# Patient Record
Sex: Female | Born: 1940 | Race: White | Hispanic: No | Marital: Married | Smoking: Former smoker
Health system: Southern US, Community
[De-identification: ages and names within clinical notes are randomized; demographics above are authoritative.]

## PROBLEM LIST (undated history)

## (undated) DIAGNOSIS — Z9289 Personal history of other medical treatment: Secondary | ICD-10-CM

## (undated) DIAGNOSIS — R0600 Dyspnea, unspecified: Secondary | ICD-10-CM

## (undated) DIAGNOSIS — I10 Essential (primary) hypertension: Secondary | ICD-10-CM

## (undated) DIAGNOSIS — J189 Pneumonia, unspecified organism: Secondary | ICD-10-CM

## (undated) DIAGNOSIS — Z87442 Personal history of urinary calculi: Secondary | ICD-10-CM

## (undated) DIAGNOSIS — Z720 Tobacco use: Secondary | ICD-10-CM

## (undated) DIAGNOSIS — I739 Peripheral vascular disease, unspecified: Secondary | ICD-10-CM

## (undated) DIAGNOSIS — D649 Anemia, unspecified: Secondary | ICD-10-CM

## (undated) DIAGNOSIS — K219 Gastro-esophageal reflux disease without esophagitis: Secondary | ICD-10-CM

## (undated) DIAGNOSIS — I499 Cardiac arrhythmia, unspecified: Secondary | ICD-10-CM

## (undated) DIAGNOSIS — E785 Hyperlipidemia, unspecified: Secondary | ICD-10-CM

## (undated) DIAGNOSIS — N200 Calculus of kidney: Secondary | ICD-10-CM

## (undated) DIAGNOSIS — Z8719 Personal history of other diseases of the digestive system: Secondary | ICD-10-CM

## (undated) DIAGNOSIS — I219 Acute myocardial infarction, unspecified: Secondary | ICD-10-CM

## (undated) DIAGNOSIS — M1990 Unspecified osteoarthritis, unspecified site: Secondary | ICD-10-CM

## (undated) HISTORY — PX: APPENDECTOMY: SHX54

## (undated) HISTORY — PX: VAGINAL HYSTERECTOMY: SUR661

## (undated) HISTORY — DX: Hyperlipidemia, unspecified: E78.5

## (undated) HISTORY — PX: CHOLECYSTECTOMY: SHX55

## (undated) HISTORY — PX: INGUINAL HERNIA REPAIR: SUR1180

## (undated) HISTORY — PX: KNEE ARTHROSCOPY: SHX127

## (undated) HISTORY — PX: EYE SURGERY: SHX253

## (undated) HISTORY — PX: TUBAL LIGATION: SHX77

## (undated) HISTORY — PX: COLONOSCOPY: SHX174

## (undated) HISTORY — DX: Tobacco use: Z72.0

## (undated) HISTORY — DX: Personal history of other medical treatment: Z92.89

---

## 1962-07-20 DIAGNOSIS — Z9289 Personal history of other medical treatment: Secondary | ICD-10-CM

## 1962-07-20 HISTORY — DX: Personal history of other medical treatment: Z92.89

## 2001-11-23 ENCOUNTER — Ambulatory Visit (HOSPITAL_COMMUNITY): Admission: RE | Admit: 2001-11-23 | Discharge: 2001-11-23 | Payer: Self-pay

## 2008-12-06 DIAGNOSIS — Z9289 Personal history of other medical treatment: Secondary | ICD-10-CM

## 2008-12-06 HISTORY — DX: Personal history of other medical treatment: Z92.89

## 2009-07-08 ENCOUNTER — Inpatient Hospital Stay (HOSPITAL_COMMUNITY): Admission: EM | Admit: 2009-07-08 | Discharge: 2009-07-16 | Payer: Self-pay | Admitting: Emergency Medicine

## 2010-04-11 ENCOUNTER — Encounter: Admission: RE | Admit: 2010-04-11 | Discharge: 2010-04-11 | Payer: Self-pay | Admitting: Cardiology

## 2010-04-17 ENCOUNTER — Ambulatory Visit (HOSPITAL_COMMUNITY): Admission: RE | Admit: 2010-04-17 | Discharge: 2010-04-18 | Payer: Self-pay | Admitting: Cardiovascular Disease

## 2010-04-17 HISTORY — PX: OTHER SURGICAL HISTORY: SHX169

## 2010-10-02 LAB — BASIC METABOLIC PANEL
CO2: 27 mEq/L (ref 19–32)
Calcium: 8.7 mg/dL (ref 8.4–10.5)
Chloride: 102 mEq/L (ref 96–112)
Glucose, Bld: 112 mg/dL — ABNORMAL HIGH (ref 70–99)
Potassium: 3.8 mEq/L (ref 3.5–5.1)
Sodium: 137 mEq/L (ref 135–145)

## 2010-10-02 LAB — CBC
HCT: 32.8 % — ABNORMAL LOW (ref 36.0–46.0)
Hemoglobin: 10.9 g/dL — ABNORMAL LOW (ref 12.0–15.0)
MCH: 27.5 pg (ref 26.0–34.0)
MCV: 82.6 fL (ref 78.0–100.0)
Platelets: 263 10*3/uL (ref 150–400)
RBC: 3.97 MIL/uL (ref 3.87–5.11)
WBC: 9.5 10*3/uL (ref 4.0–10.5)

## 2010-10-02 LAB — MRSA PCR SCREENING

## 2010-10-20 LAB — COMPREHENSIVE METABOLIC PANEL
BUN: 13 mg/dL (ref 6–23)
CO2: 27 mEq/L (ref 19–32)
Chloride: 86 mEq/L — ABNORMAL LOW (ref 96–112)
Creatinine, Ser: 0.86 mg/dL (ref 0.4–1.2)
GFR calc non Af Amer: >60 mL/min (ref >60)
Total Bilirubin: 0.6 mg/dL (ref 0.3–1.2)

## 2010-10-20 LAB — BASIC METABOLIC PANEL
BUN: 12 mg/dL (ref 6–23)
BUN: 7 mg/dL (ref 6–23)
BUN: 8 mg/dL (ref 6–23)
BUN: 9 mg/dL (ref 6–23)
CO2: 23 mEq/L (ref 19–32)
CO2: 27 mEq/L (ref 19–32)
CO2: 27 mEq/L (ref 19–32)
CO2: 29 mEq/L (ref 19–32)
Calcium: 8 mg/dL — ABNORMAL LOW (ref 8.4–10.5)
Calcium: 8.6 mg/dL (ref 8.4–10.5)
Chloride: 102 mEq/L (ref 96–112)
Chloride: 103 mEq/L (ref 96–112)
Chloride: 94 mEq/L — ABNORMAL LOW (ref 96–112)
Chloride: 94 mEq/L — ABNORMAL LOW (ref 96–112)
Chloride: 98 mEq/L (ref 96–112)
Creatinine, Ser: 0.67 mg/dL (ref 0.4–1.2)
Creatinine, Ser: 0.87 mg/dL (ref 0.4–1.2)
GFR calc non Af Amer: >60 mL/min (ref >60)
GFR calc non Af Amer: >60 mL/min (ref >60)
GFR calc non Af Amer: >60 mL/min (ref >60)
Glucose, Bld: 109 mg/dL — ABNORMAL HIGH (ref 70–99)
Glucose, Bld: 164 mg/dL — ABNORMAL HIGH (ref 70–99)
Potassium: 3.5 mEq/L (ref 3.5–5.1)
Potassium: 3.7 mEq/L (ref 3.5–5.1)
Potassium: 4.3 mEq/L (ref 3.5–5.1)
Potassium: 4.4 mEq/L (ref 3.5–5.1)
Sodium: 130 mEq/L — ABNORMAL LOW (ref 135–145)
Sodium: 131 mEq/L — ABNORMAL LOW (ref 135–145)
Sodium: 136 mEq/L (ref 135–145)

## 2010-10-20 LAB — CBC
HCT: 36.6 % (ref 36.0–46.0)
HCT: 37.3 % (ref 36.0–46.0)
HCT: 40 % (ref 36.0–46.0)
HCT: 40.7 % (ref 36.0–46.0)
Hemoglobin: 11.9 g/dL — ABNORMAL LOW (ref 12.0–15.0)
Hemoglobin: 12.6 g/dL (ref 12.0–15.0)
Hemoglobin: 12.7 g/dL (ref 12.0–15.0)
Hemoglobin: 13.8 g/dL (ref 12.0–15.0)
MCHC: 33.6 g/dL (ref 30.0–36.0)
MCV: 83.4 fL (ref 78.0–100.0)
MCV: 83.4 fL (ref 78.0–100.0)
MCV: 83.8 fL (ref 78.0–100.0)
Platelets: 359 10*3/uL (ref 150–400)
Platelets: 377 10*3/uL (ref 150–400)
RBC: 4.39 MIL/uL (ref 3.87–5.11)
RBC: 4.48 MIL/uL (ref 3.87–5.11)
RBC: 4.53 MIL/uL (ref 3.87–5.11)
RDW: 13.9 % (ref 11.5–15.5)
RDW: 14.1 % (ref 11.5–15.5)
RDW: 14.1 % (ref 11.5–15.5)
RDW: 14.1 % (ref 11.5–15.5)
RDW: 14.1 % (ref 11.5–15.5)
WBC: 13.9 10*3/uL — ABNORMAL HIGH (ref 4.0–10.5)
WBC: 15.9 10*3/uL — ABNORMAL HIGH (ref 4.0–10.5)
WBC: 16.3 10*3/uL — ABNORMAL HIGH (ref 4.0–10.5)
WBC: 17.6 10*3/uL — ABNORMAL HIGH (ref 4.0–10.5)

## 2010-10-20 LAB — URINALYSIS, ROUTINE W REFLEX MICROSCOPIC
Bilirubin Urine: NEGATIVE
Glucose, UA: NEGATIVE mg/dL
Ketones, ur: >80 mg/dL — AB
Nitrite: NEGATIVE
Nitrite: NEGATIVE
Specific Gravity, Urine: 1.012 (ref 1.005–1.030)
Specific Gravity, Urine: 1.02 (ref 1.005–1.030)
Urobilinogen, UA: 0.2 mg/dL (ref 0.0–1.0)
pH: 5.5 (ref 5.0–8.0)
pH: 7.5 (ref 5.0–8.0)

## 2010-10-20 LAB — URINE MICROSCOPIC-ADD ON

## 2010-10-20 LAB — DIFFERENTIAL
Basophils Absolute: 0 10*3/uL (ref 0.0–0.1)
Basophils Absolute: 0.1 10*3/uL (ref 0.0–0.1)
Basophils Relative: 0 % (ref 0–1)
Basophils Relative: 0 % (ref 0–1)
Eosinophils Absolute: 0.1 10*3/uL (ref 0.0–0.7)
Eosinophils Relative: 1 % (ref 0–5)
Neutro Abs: 10.8 10*3/uL — ABNORMAL HIGH (ref 1.7–7.7)
Neutrophils Relative %: 78 % — ABNORMAL HIGH (ref 43–77)

## 2010-10-20 LAB — CARDIAC PANEL(CRET KIN+CKTOT+MB+TROPI)
CK, MB: 1.9 ng/mL (ref 0.3–4.0)
CK, MB: 3 ng/mL (ref 0.3–4.0)
Relative Index: 2.5 (ref 0.0–2.5)
Total CK: 115 U/L (ref 7–177)
Troponin I: 0.02 ng/mL (ref 0.00–0.06)

## 2010-10-20 LAB — URINE CULTURE

## 2010-10-20 LAB — HEMOCCULT GUIAC POC 1CARD (OFFICE): Fecal Occult Bld: NEGATIVE

## 2010-10-20 LAB — TSH: TSH: 0.462 u[IU]/mL (ref 0.350–4.500)

## 2010-10-20 LAB — LIPASE, BLOOD: Lipase: 28 U/L (ref 11–59)

## 2010-10-20 LAB — HEPARIN LEVEL (UNFRACTIONATED): Heparin Unfractionated: 0.14 IU/mL — ABNORMAL LOW (ref 0.30–0.70)

## 2010-12-02 NOTE — Procedures (Signed)
NAMEGINIA, RUDELL NO.:  [id number]   MEDICAL RECORD NO.:  [id number]          PATIENT TYPE:  OIB   LOCATION:  2909                         FACILITY:  [facility]   PHYSICIAN:  Nanetta Batty, M.D.   DATE OF BIRTH:  [birth year]   DATE OF PROCEDURE:  DATE OF DISCHARGE:                    PERIPHERAL VASCULAR INVASIVE PROCEDURE    Ms. Christy Campbell is a 70 year old patient with positive risk factors  including hypertension, hyperlipidemia who was sent by Dr. Marina Goodell for  evaluation and treatment of claudication.  Dopplers suggested high-grade  right common iliac and left SFA disease.  She presents here for  angiography and potential intervention.   PROCEDURE DESCRIPTION:  The patient was brought to second floor of [facility] in postabsorptive state.  She is premedicated  with p.o. Valium, IV Versed, and fentanyl.  Her right groin was prepped  and shaved in usual sterile fashion.  A 1% Xylocaine was used for local  anesthesia.  A 5 upgraded to a 7-French crossover sheath was inserted  into the right femoral artery using standard Seldinger technique.  A 5-  French pigtail catheter was used for arch angiography, midstream and  distal abdominal aortography with bifemoral runoff using bolus chase  digital subtraction step-table technique.  Visipaque dye was used for  entirety of the case.  Retrograde aortic pressures were monitored  throughout the case.   ANGIOGRAPHIC RESULTS:  1. Arch aortogram normal.  2. Abdominal aortogram.      a.     A 50% left renal artery stenosis.  3. Left lower extremity.      a.     A 80% distal left SFA stenosis, 3-vessel runoff.  4. Right lower extremity.      a.     A 80% proximal right common iliac artery stenosis with post-       stenotic dilatation.      b.     A 80% proximal right SFA with 3-vessel runoff.   IMPRESSION:  The patient has high-grade bilateral superficial femoral  artery and right iliac disease.  We  will proceed with PTA and stenting  of the left superficial femoral artery and right common iliac artery.   The patient received 4000 units of heparin intravenously.  Contralateral  access was obtained with a 7-French Cook Ansel sheath.  Wholey wire was  used passed to across the lesion which was predilated with a 4 x 2  Powerflex.  This is stented with a 6 x 4 Smart stent and postdilated  with 5 x 3 FoxCross resulting in reduction of 80% stenosis to 0%  residual.   Following this, the sheath was pulled back across the bifurcation.  An  injection was performed localizing the iliac lesion.  This was then  directly stented with a 12 x 4 Smart stent.  There was some overhang  into the ostium of the left common iliac artery stent because of poor  visualization.  This was then postdilated with 7 x 3 balloon resulting  in reduction of 80% stenosis to 0% residual.  There was residual right  superficial femoral artery disease.  Successful PTA and stenting of left superficial femoral artery and right  iliac.  The patient tolerated the procedure well.  The lung sheath was  exchanged for short 7-French sheath.  The patient left lab in stable  condition.  Sheath will  be removed once ACT falls below 170.  Pressure will be held on the groin  to achieve hemostasis.  The patient left the lab in stable condition.  She will be discharged in the morning if she remains stable overnight.  We will get followup Dopplers and ABIs at which time she will see me  back in followup.      Nanetta Batty, M.D.      Cordelia Pen  D:  04/17/2010  T:  04/18/2010  Job:  [id number]   cc:   [facility]  Brent Bulla, MD  [facility]   Electronically Signed by Nanetta Batty M.D. on 04/30/2010 01:01:44 PM

## 2010-12-05 NOTE — Op Note (Signed)
[facility]  Patient:    Christy Campbell, Christy Campbell Visit Number: [id number] MRN: [id number]          Service Type: DSU Location: DAY Attending Physician:  Meredith Leeds Dictated by:   Zigmund Daniel, M.D. Proc. Date: 11/23/01 Admit Date:  11/23/2001   CC:         Shelle Iron, M.D.   Operative Report  PREOPERATIVE DIAGNOSIS:  Indirect left inguinal hernia.  POSTOPERATIVE DIAGNOSIS:  Indirect left inguinal hernia.  OPERATION:  Repair of left inguinal hernia.  SURGEON:  Zigmund Daniel, M.D.  ANESTHESIA:  Local with sedation.  DESCRIPTION OF PROCEDURE:  After the patient was monitored and sedated and had routine preparation and draping of the left inguinal region, I liberally infused local anesthetic in the operative field and used further local anesthetic as I deepened the dissection.  The patient was kept sedated and comfortable throughout the procedure.  I made a short, slightly oblique incision, beginning just above and lateral to the pubic tubercle and dissected down through the fat until I encountered the external oblique which I opened in the direction of its fibers.  I identified the ilioinguinal nerve and dissected it free of the round ligament and hernia and protected it and preserved it.  I then detached the round ligament at the pubic tubercle, getting hemostasis toward the pubic tubercle with the Bovie.  In dissecting it up, I made a hole in the large indirect hernia sac which appeared rather chronic.  I dissected a little bit of the sac up and removed it and then suture ligated the remainder with 3-0 Vicryl.  It contained no viscera at the moment.  I dissected the entire hernia and round ligament up to the level of the internal ring and reduced the hernia and plugged the internal ring with a generous plug of polypropylene mesh, sewn in with a 2-0 silk figure-of-eight suture.  I sewed that into the internal oblique fascia.  I  then patched the inguinal floor with a patch of polypropylene mesh sewn in with 2-0 Prolene from the pubic tubercle superiorly and medially with a basting stitch in the superficial layer of the internal oblique fascia and inferiorly and medially with a running stitch in the shelving edge of the inguinal ligament.  I felt that the hernia repair was secure.  Hemostasis was good.  I put a little more local anesthetic in the wound and then closed the external oblique with running 3-0 Vicryl, approximated the subcutaneous tissues with running 3-0 Vicryl, and closed the skin with intracuticular 4-0 Vicryl and Steri-Strips. The patient tolerated the operation well. Dictated by:   Zigmund Daniel, M.D. Attending Physician:  Meredith Leeds DD:  11/23/01 TD:  11/23/01 Job: [id number]

## 2011-11-30 ENCOUNTER — Ambulatory Visit
Admission: RE | Admit: 2011-11-30 | Discharge: 2011-11-30 | Disposition: A | Discharge status: Home / Self Care | Payer: Medicare Other | Source: Ambulatory Visit | Attending: Cardiovascular Disease | Admitting: Cardiovascular Disease

## 2011-11-30 ENCOUNTER — Other Ambulatory Visit: Payer: Self-pay | Admitting: Cardiovascular Disease

## 2011-11-30 DIAGNOSIS — Z01811 Encounter for preprocedural respiratory examination: Secondary | ICD-10-CM

## 2011-12-16 ENCOUNTER — Encounter (HOSPITAL_COMMUNITY): Payer: Self-pay | Admitting: Respiratory Therapy

## 2011-12-29 ENCOUNTER — Encounter (HOSPITAL_COMMUNITY)
Admission: RE | Disposition: A | Discharge status: Home / Self Care | Payer: Self-pay | Source: Ambulatory Visit | Attending: Cardiovascular Disease

## 2011-12-29 ENCOUNTER — Other Ambulatory Visit: Payer: Self-pay | Admitting: Cardiovascular Disease

## 2011-12-29 ENCOUNTER — Encounter (HOSPITAL_COMMUNITY): Payer: Self-pay | Admitting: Cardiology

## 2011-12-29 ENCOUNTER — Ambulatory Visit (HOSPITAL_COMMUNITY)
Admission: RE | Admit: 2011-12-29 | Discharge: 2011-12-30 | Disposition: A | Discharge status: Home / Self Care | Payer: Medicare Other | Source: Ambulatory Visit | Attending: Cardiovascular Disease | Admitting: Cardiovascular Disease

## 2011-12-29 ENCOUNTER — Ambulatory Visit (HOSPITAL_COMMUNITY): Payer: Medicare Other

## 2011-12-29 DIAGNOSIS — I701 Atherosclerosis of renal artery: Secondary | ICD-10-CM | POA: Insufficient documentation

## 2011-12-29 DIAGNOSIS — I70219 Atherosclerosis of native arteries of extremities with intermittent claudication, unspecified extremity: Secondary | ICD-10-CM | POA: Insufficient documentation

## 2011-12-29 DIAGNOSIS — I739 Peripheral vascular disease, unspecified: Secondary | ICD-10-CM

## 2011-12-29 DIAGNOSIS — E785 Hyperlipidemia, unspecified: Secondary | ICD-10-CM | POA: Insufficient documentation

## 2011-12-29 DIAGNOSIS — R109 Unspecified abdominal pain: Secondary | ICD-10-CM | POA: Diagnosis not present

## 2011-12-29 DIAGNOSIS — N289 Disorder of kidney and ureter, unspecified: Secondary | ICD-10-CM | POA: Diagnosis present

## 2011-12-29 DIAGNOSIS — IMO0001 Reserved for inherently not codable concepts without codable children: Secondary | ICD-10-CM

## 2011-12-29 DIAGNOSIS — I10 Essential (primary) hypertension: Secondary | ICD-10-CM | POA: Insufficient documentation

## 2011-12-29 HISTORY — PX: OTHER SURGICAL HISTORY: SHX169

## 2011-12-29 HISTORY — DX: Peripheral vascular disease, unspecified: I73.9

## 2011-12-29 HISTORY — PX: ATHERECTOMY: SHX5502

## 2011-12-29 LAB — POCT ACTIVATED CLOTTING TIME: Activated Clotting Time: 204 seconds

## 2011-12-29 SURGERY — ATHERECTOMY
Anesthesia: LOCAL

## 2011-12-29 MED ORDER — ONDANSETRON HCL 4 MG/2ML IJ SOLN
INTRAMUSCULAR | Status: AC
  Filled 2011-12-29: qty 2

## 2011-12-29 MED ORDER — MORPHINE SULFATE 4 MG/ML IJ SOLN
INTRAMUSCULAR | Status: AC
  Filled 2011-12-29: qty 1

## 2011-12-29 MED ORDER — HYDRALAZINE HCL 20 MG/ML IJ SOLN: 10.0000 mg | INTRAMUSCULAR | Status: DC

## 2011-12-29 MED ORDER — METOPROLOL TARTRATE 25 MG PO TABS
25.0000 mg | ORAL_TABLET | Freq: Every day | ORAL | Status: DC
  Administered 2011-12-30: 25 mg via ORAL
  Filled 2011-12-29 (×2): qty 1

## 2011-12-29 MED ORDER — MIDAZOLAM HCL 2 MG/2ML IJ SOLN
INTRAMUSCULAR | Status: AC
  Filled 2011-12-29: qty 2

## 2011-12-29 MED ORDER — FENTANYL CITRATE 0.05 MG/ML IJ SOLN
INTRAMUSCULAR | Status: AC
  Filled 2011-12-29: qty 2

## 2011-12-29 MED ORDER — FAMOTIDINE IN NACL 20-0.9 MG/50ML-% IV SOLN
20.0000 mg | Freq: Once | INTRAVENOUS | Status: AC
  Administered 2011-12-29: 20 mg via INTRAVENOUS

## 2011-12-29 MED ORDER — ASPIRIN EC 81 MG PO TBEC
81.0000 mg | DELAYED_RELEASE_TABLET | Freq: Every day | ORAL | Status: DC
  Administered 2011-12-29 – 2011-12-30 (×2): 81 mg via ORAL
  Filled 2011-12-29 (×2): qty 1

## 2011-12-29 MED ORDER — LISINOPRIL-HYDROCHLOROTHIAZIDE 20-12.5 MG PO TABS: 1.0000 | ORAL_TABLET | Freq: Two times a day (BID) | ORAL | Status: DC

## 2011-12-29 MED ORDER — SODIUM CHLORIDE 0.9 % IJ SOLN
3.0000 mL | Freq: Two times a day (BID) | INTRAMUSCULAR | Status: DC
  Administered 2011-12-29 – 2011-12-30 (×2): 3 mL via INTRAVENOUS

## 2011-12-29 MED ORDER — DILTIAZEM HCL ER COATED BEADS 360 MG PO CP24
360.0000 mg | ORAL_CAPSULE | Freq: Every day | ORAL | Status: DC
  Administered 2011-12-30: 360 mg via ORAL
  Filled 2011-12-29 (×3): qty 1

## 2011-12-29 MED ORDER — GI COCKTAIL ~~LOC~~
30.0000 mL | Freq: Once | ORAL | Status: AC
  Administered 2011-12-29: 30 mL via ORAL
  Filled 2011-12-29: qty 30

## 2011-12-29 MED ORDER — LISINOPRIL 20 MG PO TABS
20.0000 mg | ORAL_TABLET | Freq: Two times a day (BID) | ORAL | Status: DC
  Administered 2011-12-29 – 2011-12-30 (×2): 20 mg via ORAL
  Filled 2011-12-29 (×3): qty 1

## 2011-12-29 MED ORDER — ACETAMINOPHEN 325 MG PO TABS
325.0000 mg | ORAL_TABLET | Freq: Every day | ORAL | Status: DC
  Administered 2011-12-29: 325 mg via ORAL
  Filled 2011-12-29: qty 1

## 2011-12-29 MED ORDER — CLOPIDOGREL BISULFATE 75 MG PO TABS
75.0000 mg | ORAL_TABLET | Freq: Every day | ORAL | Status: DC
  Administered 2011-12-29 – 2011-12-30 (×2): 75 mg via ORAL
  Filled 2011-12-29 (×2): qty 1

## 2011-12-29 MED ORDER — DIAZEPAM 5 MG PO TABS: 5.0000 mg | ORAL_TABLET | ORAL | Status: DC

## 2011-12-29 MED ORDER — SODIUM CHLORIDE 0.9 % IV SOLN: INTRAVENOUS | Status: DC

## 2011-12-29 MED ORDER — MORPHINE SULFATE 4 MG/ML IJ SOLN
1.0000 mg | INTRAMUSCULAR | Status: DC | PRN
  Administered 2011-12-29 – 2011-12-30 (×3): 1 mg via INTRAVENOUS
  Filled 2011-12-29 (×2): qty 1

## 2011-12-29 MED ORDER — SODIUM CHLORIDE 0.9 % IJ SOLN: 3.0000 mL | Freq: Two times a day (BID) | INTRAMUSCULAR | Status: DC

## 2011-12-29 MED ORDER — ASPIRIN 81 MG PO TBEC: 81.0000 mg | DELAYED_RELEASE_TABLET | Freq: Every day | ORAL | Status: DC

## 2011-12-29 MED ORDER — LIDOCAINE HCL (PF) 1 % IJ SOLN
INTRAMUSCULAR | Status: AC
  Filled 2011-12-29: qty 30

## 2011-12-29 MED ORDER — HYDROCHLOROTHIAZIDE 12.5 MG PO CAPS
12.5000 mg | ORAL_CAPSULE | Freq: Two times a day (BID) | ORAL | Status: DC
  Administered 2011-12-29 – 2011-12-30 (×2): 12.5 mg via ORAL
  Filled 2011-12-29 (×3): qty 1

## 2011-12-29 MED ORDER — ALUM & MAG HYDROXIDE-SIMETH 200-200-20 MG/5ML PO SUSP
15.0000 mL | ORAL | Status: DC | PRN
  Filled 2011-12-29: qty 30

## 2011-12-29 MED ORDER — FAMOTIDINE IN NACL 20-0.9 MG/50ML-% IV SOLN
INTRAVENOUS | Status: AC
  Filled 2011-12-29: qty 50

## 2011-12-29 MED ORDER — SODIUM CHLORIDE 0.9 % IV SOLN: INTRAVENOUS | Status: AC

## 2011-12-29 MED ORDER — ONDANSETRON HCL 4 MG/2ML IJ SOLN: 4.0000 mg | Freq: Four times a day (QID) | INTRAMUSCULAR | Status: DC | PRN

## 2011-12-29 MED ORDER — PROMETHAZINE HCL 25 MG/ML IJ SOLN
INTRAMUSCULAR | Status: AC
  Filled 2011-12-29: qty 1

## 2011-12-29 MED ORDER — HEPARIN SODIUM (PORCINE) 1000 UNIT/ML IJ SOLN
INTRAMUSCULAR | Status: AC
  Filled 2011-12-29: qty 1

## 2011-12-29 MED ORDER — ACETAMINOPHEN 325 MG PO TABS: 650.0000 mg | ORAL_TABLET | ORAL | Status: DC | PRN

## 2011-12-29 MED ORDER — HEPARIN (PORCINE) IN NACL 2-0.9 UNIT/ML-% IJ SOLN
INTRAMUSCULAR | Status: AC
  Filled 2011-12-29: qty 1000

## 2011-12-29 MED ORDER — ATORVASTATIN CALCIUM 40 MG PO TABS
40.0000 mg | ORAL_TABLET | Freq: Every day | ORAL | Status: DC
  Administered 2011-12-29: 40 mg via ORAL
  Filled 2011-12-29 (×2): qty 1

## 2011-12-29 MED ORDER — ONDANSETRON HCL 4 MG/2ML IJ SOLN
4.0000 mg | Freq: Four times a day (QID) | INTRAMUSCULAR | Status: DC | PRN
  Administered 2011-12-29: 4 mg via INTRAVENOUS

## 2011-12-29 MED ORDER — OMEGA-3-ACID ETHYL ESTERS 1 G PO CAPS
1.0000 g | ORAL_CAPSULE | Freq: Two times a day (BID) | ORAL | Status: DC
  Administered 2011-12-29 – 2011-12-30 (×2): 1 g via ORAL
  Filled 2011-12-29 (×3): qty 1

## 2011-12-29 MED ORDER — PROMETHAZINE HCL 25 MG/ML IJ SOLN
12.5000 mg | Freq: Four times a day (QID) | INTRAMUSCULAR | Status: DC | PRN
  Administered 2011-12-29: 12.5 mg via INTRAVENOUS

## 2011-12-29 NOTE — H&P (Signed)
  H & P will be scanned in.  Pt was reexamined and existing H & P reviewed. No changes found.  Runell Gess, MD [facility] 12/29/2011 9:27 AM

## 2011-12-29 NOTE — Progress Notes (Signed)
Called by cath lab holding area due to pt's abd. Pain.  Pain developed after she took a few bites of lunch.  She also complained of back pain, rec'd IV morphine, then increased abd pain. And back pain. She stated this was like when she took asprin on another admit.  GI cocktail given and IV Pepcid.  Pt. Then began with slowing heart rate into the 30's accompanied by nausea and vomiting.  IV atropine 0.5 mg given.  HR returned to WNL, Zofran given for nausea and vomiting..  After vomiting her abd pain began easing.  Pt's pain did return along with nausea and vomiting. Phenergan given but continues to complain of 5/10 abd pain.  Abd on exam has been soft with bowel sounds, but with continued pain and recent procedure will proceed with CT of abd and pelvis.  Dr. Allyson Sabal aware and in agreement.  We will place pt. In 2900 B instead of 2500 due to ongoing pain.

## 2011-12-29 NOTE — Op Note (Signed)
Christy Campbell is a 71 y.o. female    [id number] LOCATION:  FACILITY: [facility]  PHYSICIAN: Nanetta Batty, M.D. Jun 23, 1941   DATE OF PROCEDURE:  12/29/2011  DATE OF DISCHARGE:  [facility]  PV Intervention    History obtained from chart review. Christy Campbell  is a 71 year old divorce Caucasian female mother of 2 who I saw in the office on 11/30/2011. Her problems include discontinue tobacco abuse earlier this year, hypertension, hyperlipidemia and peripheral vascular occlusive disease. She was referred to me for claudication. Her cardiac workup revealed a negative Myoview and normal 2-D echo with moderate concentric LVH. She was to have iliac and SFA disease by angiography which I performed 04/17/2010. I stented her right common iliac artery with a nitinol self expanding stent as well as her distal left SFA. She had residual proximal right SFA stenosis and has been complaining of bilateral lower clinic location. Recent Dopplers in our office revealed a right ABI of 0.6, and a left ABI of 0.87 with high-frequency signals in both proximal SFAs. She presents now for angiography and potential percutaneous intervention for lifestyle limiting claudication.   PROCEDURE DESCRIPTION:    The patient was brought to the second floor  [facility] in the postabsorptive state. She was  premedicated with IV Versed and fentanyl.. Her left groin was prepped and shaved in usual sterile fashion. Xylocaine 1% was used  for local anesthesia. A 5 French sheath was inserted into the left common femoral  artery using standard Seldinger technique. A 5 French pigtail catheter was used for abdominal aortography with bifemoral runoff using bolus chase digital subtraction settable technique. Visipaque dye was used for the entirety of the case. Retrograde aortic pressure was monitored during the case.   HEMODYNAMICS:    AO SYSTOLIC/AO DIASTOLIC: on 191/81   ANGIOGRAPHIC  RESULTS:   1: Abdominal aortogram-40% left renal artery stenosis, normal infra renal abdominal aorta.  2: Left lower extremity-90% eccentric proximal left SFA stenosis. The distal left SFA stent was widely patent. There was three-vessel runoff.  3: Right lower extremity-patent right proximal common iliac artery stent with obvious "overhang" into the origin of the left common iliac artery. 80-90% eccentric proximal right SFA stenosis with three-vessel runoff.   IMPRESSION:Christy Campbell  has a high-grade eccentric proximal right SFA stenosis. We'll proceed with turbo hock directional atherectomy for lifestyle limiting claudication.  Procedure description: Contralateral access was obtained with a 5 [place] crossover catheter, angled Glidewire, and 7 [place] destination sheath. The patient received 5000 units of heparin intravenously and ACT of 276. Total contrast administered to the patient was 167 cc. The lesion was crossed with a 0.014 cm [place] core wire. Directional atherectomy was performed with LS--M turbo [facility] device. 5 cuts were performed removing a significant amount of atherosclerotic plaque. Completion angiography revealed a widely patent proximal right SFA without dissection. Balloon angioplasty was not required. The sheath was then pulled back across the bifurcation and secured in place. The patient left the lab in stable condition.  Final impression: Successful directional atherectomy using the turbo Hawk device and eccentric proximal right SFA stenosis prolapse pelvic claudication. The patient is already on aspirin and Plavix. She'll be hydrated overnight, discharged in the morning will get followup Dopplers  On her back in the office , followup after that and we'll discuss left SFA intervention.  Runell Gess MD, [facility] 12/29/2011 10:37 AM

## 2011-12-29 NOTE — Discharge Instructions (Signed)

## 2011-12-30 ENCOUNTER — Ambulatory Visit (HOSPITAL_COMMUNITY): Payer: Medicare Other

## 2011-12-30 DIAGNOSIS — I10 Essential (primary) hypertension: Secondary | ICD-10-CM | POA: Diagnosis present

## 2011-12-30 DIAGNOSIS — N289 Disorder of kidney and ureter, unspecified: Secondary | ICD-10-CM | POA: Diagnosis present

## 2011-12-30 DIAGNOSIS — IMO0001 Reserved for inherently not codable concepts without codable children: Secondary | ICD-10-CM

## 2011-12-30 DIAGNOSIS — E785 Hyperlipidemia, unspecified: Secondary | ICD-10-CM | POA: Diagnosis present

## 2011-12-30 LAB — CBC
MCHC: 32.7 g/dL (ref 30.0–36.0)
Platelets: 278 10*3/uL (ref 150–400)
RDW: 15.1 % (ref 11.5–15.5)

## 2011-12-30 LAB — BASIC METABOLIC PANEL
BUN: 21 mg/dL (ref 6–23)
Calcium: 9.3 mg/dL (ref 8.4–10.5)
Creatinine, Ser: 0.99 mg/dL (ref 0.50–1.10)
GFR calc Af Amer: 65 mL/min — ABNORMAL LOW (ref >90)
GFR calc non Af Amer: 56 mL/min — ABNORMAL LOW (ref >90)
Potassium: 4.1 mEq/L (ref 3.5–5.1)

## 2011-12-30 MED ORDER — ATROPINE SULFATE 1 MG/ML IJ SOLN
INTRAMUSCULAR | Status: AC
  Filled 2011-12-30: qty 1

## 2011-12-30 MED ORDER — GADOBENATE DIMEGLUMINE 529 MG/ML IV SOLN
20.0000 mL | Freq: Once | INTRAVENOUS | Status: AC
  Administered 2011-12-30: 17 mL via INTRAVENOUS

## 2011-12-30 NOTE — Progress Notes (Signed)
Subjective:  No more abdominal pain.   Objective:  Vital Signs in the last 24 hours: Temp:  [97.8 F (36.6 C)-98.6 F (37 C)] 97.8 F (36.6 C) (06/12 0400) Pulse Rate:  [59-73] 68  (06/11 1800) Resp:  [10-21] 13  (06/11 1800) BP: (98-134)/(49-72) 98/49 mmHg (06/11 2126) SpO2:  [96 %-99 %] 96 % (06/11 1800)  Intake/Output from previous day:  Intake/Output Summary (Last 24 hours) at 12/30/11 0835 Last data filed at 12/30/11 0600  Gross per 24 hour  Intake    308 ml  Output   1250 ml  Net   -942 ml    Physical Exam: General appearance: alert, cooperative and no distress Lungs: clear to auscultation bilaterally Heart: regular rate and rhythm Lt groin, mild echymosis, no hematoma   Rate: 70  Rhythm: normal sinus rhythm  Lab Results:  Basename 12/30/11 0555  WBC 11.2*  HGB 12.4  PLT 278    Basename 12/30/11 0555  NA 140  K 4.1  CL 101  CO2 30  GLUCOSE 93  BUN 21  CREATININE 0.99   No results found for this basename: TROPONINI:2,CK,MB:2 in the last 72 hours Hepatic Function Panel No results found for this basename: PROT,ALBUMIN,AST,ALT,ALKPHOS,BILITOT,BILIDIR,IBILI in the last 72 hours No results found for this basename: CHOL in the last 72 hours No results found for this basename: INR in the last 72 hours  Imaging: Imaging results have been reviewed  Cardiac Studies:  Assessment/Plan:   Principal Problem:  *Claudication in peripheral vascular disease Active Problems:  PVD, Rt SFA PTA 12/29/11, residual Lt SFA disease  Renal lesion, incidental finding on CT 12/29/11  Abdominal pain, post PV angio, Possibly secondary to ASA on empty stomach  HTN, LVH, Nl LVF May 2010  Dyslipidemia  Normal Myoview, May 2010   Plan- Will discuss CT scan with MD-? Work up now or as OP. Otherwise she is ready for discharge.   Corine Shelter PA-C 12/30/2011, 8:35 AM

## 2011-12-30 NOTE — Discharge Summary (Signed)
Patient ID: Christy Campbell,  MRN: [id number], DOB/AGE: 11-22-1940 71 y.o.  Admit date: 12/29/2011 Discharge date: 12/30/2011  Primary Care Provider:  Primary Cardiologist: Dr Allyson Sabal  Discharge Diagnoses Principal Problem:  *Claudication in peripheral vascular disease Active Problems:  PVD, Rt SFA PTA 12/29/11, residual Lt SFA disease  Renal lesion, incidental finding on CT 12/29/11  Abdominal pain, post PV angio, Possibly secondary to ASA on empty stomach  HTN, LVH, Nl LVF May 2010  Dyslipidemia  Normal Myoview, May 2010    Procedures: Rt SFA [facility] Course:Christy Campbell is a 71 year old divorced Caucasian female mother of 2 who I saw in the office on 11/30/2011. Her problems include discontinue tobacco abuse earlier this year, hypertension, hyperlipidemia and peripheral vascular occlusive disease. She was referred to Dr Allyson Sabal for claudication. Her cardiac workup revealed a negative Myoview and normal 2-D echo with moderate concentric LVH. She was found to have iliac and SFA disease by angiography which I performed 04/17/2010. Dr Allyson Sabal stented her right common iliac artery with a nitinol self expanding stent as well as her distal left SFA. She had residual proximal right SFA stenosis and has been complaining of bilateral lower clinic location. Recent Dopplers in our office revealed a right ABI of 0.6, and a left ABI of 0.87 with high-frequency signals in both proximal SFAs. She presents now for angiography and potential percutaneous intervention for lifestyle limiting claudication. The pt had PV angiogram and Rt SFA PTA 12/29/11 by Dr Allyson Sabal. Post op she had abdominal pain that was ultimatley felt to be secondary to ASA. A Ct of her abdomin was done and she had a Lt renal mass noted. MRI W/O was ordered. She'll be discharged later today after this is done. She does have residual Lt SFA disease and further Rx options will be discussed with Dr Allyson Sabal as an OP.     Discharge Vitals:  Blood  pressure 140/82, pulse 70, temperature 98 F (36.7 C), temperature source Oral, resp. rate 18, height 5\' 7"  (1.702 m), weight 81.194 kg (179 lb), SpO2 98.00%.    Labs: Results for orders placed during the hospital encounter of 12/29/11 (from the past 48 hour(s))  POCT ACTIVATED CLOTTING TIME     Status: Normal   Collection Time   12/29/11 10:06 AM      Component Value Range Comment   Activated Clotting Time 276     POCT ACTIVATED CLOTTING TIME     Status: Normal   Collection Time   12/29/11 11:10 AM      Component Value Range Comment   Activated Clotting Time 204     POCT ACTIVATED CLOTTING TIME     Status: Normal   Collection Time   12/29/11 11:55 AM      Component Value Range Comment   Activated Clotting Time 171     MRSA PCR SCREENING     Status: Normal   Collection Time   12/29/11  3:55 PM      Component Value Range Comment   MRSA by PCR NEGATIVE  NEGATIVE   BASIC METABOLIC PANEL     Status: Abnormal   Collection Time   12/30/11  5:55 AM      Component Value Range Comment   Sodium 140  135 - 145 mEq/L    Potassium 4.1  3.5 - 5.1 mEq/L    Chloride 101  96 - 112 mEq/L    CO2 30  19 - 32 mEq/L    Glucose, Bld 93  70 -  99 mg/dL    BUN 21  6 - 23 mg/dL    Creatinine, Ser 7.82  0.50 - 1.10 mg/dL    Calcium 9.3  8.4 - 95.6 mg/dL    GFR calc non Af Amer 56 (*) >90 mL/min    GFR calc Af Amer 65 (*) >90 mL/min   CBC     Status: Abnormal   Collection Time   12/30/11  5:55 AM      Component Value Range Comment   WBC 11.2 (*) 4.0 - 10.5 K/uL    RBC 4.50  3.87 - 5.11 MIL/uL    Hemoglobin 12.4  12.0 - 15.0 g/dL    HCT 21.3  08.6 - 57.8 %    MCV 84.2  78.0 - 100.0 fL    MCH 27.6  26.0 - 34.0 pg    MCHC 32.7  30.0 - 36.0 g/dL    RDW 46.9  62.9 - 52.8 %    Platelets 278  150 - 400 K/uL     Disposition:  Follow-up Information    Follow up with Runell Gess, MD. (office will call)    Contact information:   [address] [place]  [address] [phone]          Discharge Medications:  Medication List  As of 12/30/2011  5:16 PM   TAKE these medications         acetaminophen 325 MG tablet   Commonly known as: TYLENOL   Take 325 mg by mouth daily after supper.      aspirin EC 81 MG EC tablet   Generic drug: aspirin   Take 81 mg by mouth daily. Swallow whole.      clopidogrel 75 MG tablet   Commonly known as: PLAVIX   Take 75 mg by mouth daily.      diltiazem 360 MG 24 hr capsule   Commonly known as: CARDIZEM CD   Take 360 mg by mouth daily.      lisinopril-hydrochlorothiazide 20-12.5 MG per tablet   Commonly known as: PRINZIDE,ZESTORETIC   Take 1 tablet by mouth 2 (two) times daily.      metoprolol tartrate 25 MG tablet   Commonly known as: LOPRESSOR   Take 25 mg by mouth daily.      multivitamin with minerals Tabs   Take 1 tablet by mouth daily.      omega-3 acid ethyl esters 1 G capsule   Commonly known as: LOVAZA   Take 1 g by mouth 2 (two) times daily.      rosuvastatin 5 MG tablet   Commonly known as: CRESTOR   Take 5 mg by mouth daily.      vitamin C 500 MG tablet   Commonly known as: ASCORBIC ACID   Take 500 mg by mouth daily.            Outstanding Labs/Studies: MRI abdomin  Duration of Discharge Encounter: Greater than 30 minutes including physician time.  Jolene Provost PA-C 12/30/2011 5:16 PM

## 2011-12-30 NOTE — Progress Notes (Signed)
Pt. Seen and examined. Agree with the NP/PA-C note as written. Abdominal pain has subsided. Exam NOT consistent with acute abdomen. CT shows possible renal cysts with enlargement of a left renal mass. MRI w/wo contrast is recommended by radiology. Will proceed with that today.   Chrystie Nose, MD, [facility] Attending Cardiologist [facility]

## 2012-02-15 ENCOUNTER — Encounter (HOSPITAL_COMMUNITY): Payer: Self-pay | Admitting: Pharmacy Technician

## 2012-02-16 ENCOUNTER — Other Ambulatory Visit: Payer: Self-pay | Admitting: Cardiovascular Disease

## 2012-03-01 ENCOUNTER — Encounter (HOSPITAL_COMMUNITY): Payer: Self-pay | Admitting: General Practice

## 2012-03-01 ENCOUNTER — Ambulatory Visit (HOSPITAL_COMMUNITY)
Admission: RE | Admit: 2012-03-01 | Discharge: 2012-03-02 | Disposition: A | Discharge status: Home / Self Care | Payer: Medicare Other | Source: Ambulatory Visit | Attending: Cardiovascular Disease | Admitting: Cardiovascular Disease

## 2012-03-01 ENCOUNTER — Encounter (HOSPITAL_COMMUNITY)
Admission: RE | Disposition: A | Discharge status: Home / Self Care | Payer: Self-pay | Source: Ambulatory Visit | Attending: Cardiovascular Disease

## 2012-03-01 DIAGNOSIS — IMO0001 Reserved for inherently not codable concepts without codable children: Secondary | ICD-10-CM

## 2012-03-01 DIAGNOSIS — I739 Peripheral vascular disease, unspecified: Secondary | ICD-10-CM | POA: Diagnosis present

## 2012-03-01 DIAGNOSIS — I10 Essential (primary) hypertension: Secondary | ICD-10-CM | POA: Diagnosis present

## 2012-03-01 DIAGNOSIS — Z8711 Personal history of peptic ulcer disease: Secondary | ICD-10-CM

## 2012-03-01 DIAGNOSIS — I70219 Atherosclerosis of native arteries of extremities with intermittent claudication, unspecified extremity: Secondary | ICD-10-CM | POA: Insufficient documentation

## 2012-03-01 DIAGNOSIS — E785 Hyperlipidemia, unspecified: Secondary | ICD-10-CM | POA: Diagnosis present

## 2012-03-01 DIAGNOSIS — I517 Cardiomegaly: Secondary | ICD-10-CM | POA: Insufficient documentation

## 2012-03-01 HISTORY — DX: Personal history of peptic ulcer disease: Z87.11

## 2012-03-01 HISTORY — DX: Essential (primary) hypertension: I10

## 2012-03-01 HISTORY — PX: PERIPHERAL ARTERIAL STENT GRAFT: SHX2220

## 2012-03-01 HISTORY — DX: Personal history of other diseases of the digestive system: Z87.19

## 2012-03-01 HISTORY — PX: ATHERECTOMY: SHX47

## 2012-03-01 HISTORY — PX: ATHERECTOMY: SHX5502

## 2012-03-01 HISTORY — DX: Unspecified osteoarthritis, unspecified site: M19.90

## 2012-03-01 LAB — POCT ACTIVATED CLOTTING TIME
Activated Clotting Time: 209 seconds
Activated Clotting Time: 254 seconds

## 2012-03-01 LAB — GLUCOSE, CAPILLARY: Glucose-Capillary: 108 mg/dL — ABNORMAL HIGH (ref 70–99)

## 2012-03-01 SURGERY — ATHERECTOMY
Anesthesia: LOCAL

## 2012-03-01 MED ORDER — OMEGA-3-ACID ETHYL ESTERS 1 G PO CAPS
1.0000 g | ORAL_CAPSULE | Freq: Two times a day (BID) | ORAL | Status: DC
  Administered 2012-03-01 – 2012-03-02 (×2): 1 g via ORAL
  Filled 2012-03-01 (×3): qty 1

## 2012-03-01 MED ORDER — SODIUM CHLORIDE 0.9 % IJ SOLN: 3.0000 mL | INTRAMUSCULAR | Status: DC | PRN

## 2012-03-01 MED ORDER — ATORVASTATIN CALCIUM 20 MG PO TABS
20.0000 mg | ORAL_TABLET | Freq: Every day | ORAL | Status: DC
  Administered 2012-03-01: 21:00:00 20 mg via ORAL
  Filled 2012-03-01 (×2): qty 1

## 2012-03-01 MED ORDER — DILTIAZEM HCL ER 240 MG PO CP24
240.0000 mg | ORAL_CAPSULE | Freq: Every day | ORAL | Status: DC
  Administered 2012-03-02: 240 mg via ORAL
  Filled 2012-03-01: qty 1

## 2012-03-01 MED ORDER — PANTOPRAZOLE SODIUM 40 MG PO TBEC: 40.0000 mg | DELAYED_RELEASE_TABLET | Freq: Every day | ORAL | Status: DC

## 2012-03-01 MED ORDER — LISINOPRIL 20 MG PO TABS
20.0000 mg | ORAL_TABLET | Freq: Every day | ORAL | Status: DC
  Administered 2012-03-02: 11:00:00 20 mg via ORAL
  Filled 2012-03-01: qty 1

## 2012-03-01 MED ORDER — MORPHINE SULFATE 2 MG/ML IJ SOLN
1.0000 mg | INTRAMUSCULAR | Status: DC | PRN
  Administered 2012-03-01: 1 mg via INTRAVENOUS
  Filled 2012-03-01: qty 1

## 2012-03-01 MED ORDER — HYDRALAZINE HCL 20 MG/ML IJ SOLN
10.0000 mg | INTRAMUSCULAR | Status: DC | PRN
  Administered 2012-03-01 – 2012-03-02 (×2): 10 mg via INTRAVENOUS
  Filled 2012-03-01: qty 0.5

## 2012-03-01 MED ORDER — LISINOPRIL-HYDROCHLOROTHIAZIDE 20-12.5 MG PO TABS: 1.0000 | ORAL_TABLET | Freq: Two times a day (BID) | ORAL | Status: DC

## 2012-03-01 MED ORDER — ASPIRIN 81 MG PO TBEC: 81.0000 mg | DELAYED_RELEASE_TABLET | Freq: Every day | ORAL | Status: DC

## 2012-03-01 MED ORDER — DIAZEPAM 5 MG PO TABS: 5.0000 mg | ORAL_TABLET | ORAL | Status: DC

## 2012-03-01 MED ORDER — SODIUM CHLORIDE 0.9 % IV SOLN
INTRAVENOUS | Status: DC
  Administered 2012-03-01: 10:00:00 via INTRAVENOUS

## 2012-03-01 MED ORDER — ONDANSETRON HCL 4 MG/2ML IJ SOLN
4.0000 mg | Freq: Four times a day (QID) | INTRAMUSCULAR | Status: DC | PRN
  Administered 2012-03-02: 07:00:00 4 mg via INTRAVENOUS
  Filled 2012-03-01: qty 2

## 2012-03-01 MED ORDER — SODIUM CHLORIDE 0.9 % IV SOLN: INTRAVENOUS | Status: AC

## 2012-03-01 MED ORDER — ACETAMINOPHEN 325 MG PO TABS
650.0000 mg | ORAL_TABLET | ORAL | Status: DC | PRN
  Administered 2012-03-01: 650 mg via ORAL
  Filled 2012-03-01: qty 2

## 2012-03-01 MED ORDER — HYDROCHLOROTHIAZIDE 12.5 MG PO CAPS
12.5000 mg | ORAL_CAPSULE | Freq: Every day | ORAL | Status: DC
  Administered 2012-03-02: 11:00:00 12.5 mg via ORAL
  Filled 2012-03-01: qty 1

## 2012-03-01 MED ORDER — HEPARIN SODIUM (PORCINE) 1000 UNIT/ML IJ SOLN
INTRAMUSCULAR | Status: AC
  Filled 2012-03-01: qty 1

## 2012-03-01 MED ORDER — LIDOCAINE HCL (PF) 1 % IJ SOLN
INTRAMUSCULAR | Status: AC
  Filled 2012-03-01: qty 30

## 2012-03-01 MED ORDER — CLOPIDOGREL BISULFATE 75 MG PO TABS
75.0000 mg | ORAL_TABLET | Freq: Every day | ORAL | Status: DC
  Administered 2012-03-02: 11:00:00 75 mg via ORAL
  Filled 2012-03-01: qty 1

## 2012-03-01 MED ORDER — METOPROLOL TARTRATE 25 MG PO TABS
25.0000 mg | ORAL_TABLET | Freq: Every morning | ORAL | Status: DC
  Administered 2012-03-02: 11:00:00 25 mg via ORAL
  Filled 2012-03-01: qty 1

## 2012-03-01 MED ORDER — HEPARIN (PORCINE) IN NACL 2-0.9 UNIT/ML-% IJ SOLN
INTRAMUSCULAR | Status: AC
  Filled 2012-03-01: qty 1000

## 2012-03-01 MED ORDER — HYDRALAZINE HCL 20 MG/ML IJ SOLN
INTRAMUSCULAR | Status: AC
  Filled 2012-03-01: qty 1

## 2012-03-01 MED ORDER — ASPIRIN EC 81 MG PO TBEC
81.0000 mg | DELAYED_RELEASE_TABLET | Freq: Every day | ORAL | Status: DC
  Administered 2012-03-02: 81 mg via ORAL
  Filled 2012-03-01: qty 1

## 2012-03-01 NOTE — Op Note (Signed)
Christy Campbell is a 71 y.o. female    [id number] LOCATION:  FACILITY: [facility]  PHYSICIAN: Nanetta Batty, M.D. 23-Aug-1940   DATE OF PROCEDURE:  03/01/2012  DATE OF DISCHARGE:  [facility]  PV Intervention    History obtained from chart review. [name] is a 71 year old divorced Caucasian female mother of 2 has a history of peripheral vascular occlusive disease. I stented her right common iliac and left SFA in the past. I performed TurboHawk  directional atherectomy of her proximal right SFA June 11 with improvement in her symptoms and ABIs. Those include hypertension and hyperlipidemia. She presents now for staged proximal left SFA TurboHawk Directional  atherectomy for lifestyle limiting claudication.   PROCEDURE DESCRIPTION:    The patient was brought to the second floor  [facility] in the postabsorptive state. She was  premedicated with Valium 5 mg by mouth. Her right groin was prepped and shaved in usual sterile fashion. Xylocaine 1% was used  for local anesthesia. A 7 French sheath was inserted into the right common femoral  artery using standard Seldinger technique. The patient received  5 gout units  of heparin  intravenously.  The ending ACT was 209. A total of 90 cc of contrast was administered to the patient.    HEMODYNAMICS:    AO SYSTOLIC/AO DIASTOLIC: 155/76   ANGIOGRAPHIC RESULTS:   Using a 7 French curved destination sheath and a 5 [place] crossover catheter, 035 angled Glidewire, 35 Rosen wire contralateral access was obtained. An 0.35 cm Versicor were wire was used along with an 035 QuickCross was used to traverse the proximal left SFA lesion and deployed a 6 mm spider distal protection device. Directional atherectomy was performed with a LSM Cutter performing multiple cuts in several quadrants. Three-vessel runoff was confirmed prior to arthrectomy and after the completion of the case. The final angiographic  result result reduction of a focal 95% lesion to less than 20% residual with excellent flow and no evidence of dissection or perforation. The spider device was retrieved and the sheath was withdrawn across the bifurcation.   IMPRESSION:successful directional atherectomy using turbo hock of a focal proximal left SFA lesion for lifestyle limiting claudication. The sheath will be removed once ACT was below 170. Patient gently hydrated overnight. She is already on aspirin and Plavix. He'll be discharged in the morning. To be followed up with my office and will see me back in the office after that.  Runell Gess MD, [facility] 03/01/2012 2:20 PM

## 2012-03-01 NOTE — H&P (Signed)
  H & P will be scanned in.  Pt was reexamined and existing H & P reviewed. No changes found.  Runell Gess, MD [facility] 03/01/2012 1:15 PM

## 2012-03-02 LAB — BASIC METABOLIC PANEL
BUN: 26 mg/dL — ABNORMAL HIGH (ref 6–23)
Calcium: 9.7 mg/dL (ref 8.4–10.5)
Creatinine, Ser: 0.87 mg/dL (ref 0.50–1.10)
GFR calc Af Amer: 76 mL/min — ABNORMAL LOW (ref >90)
GFR calc non Af Amer: 66 mL/min — ABNORMAL LOW (ref >90)
Glucose, Bld: 105 mg/dL — ABNORMAL HIGH (ref 70–99)

## 2012-03-02 LAB — CBC
MCH: 27.4 pg (ref 26.0–34.0)
MCHC: 33.1 g/dL (ref 30.0–36.0)
Platelets: 286 10*3/uL (ref 150–400)
RDW: 14.4 % (ref 11.5–15.5)

## 2012-03-02 NOTE — Progress Notes (Signed)
Subjective: No leg pain.  No complaints except she did not sleep well   Objective: Vital signs in last 24 hours: Temp:  [97.7 F (36.5 C)-98.6 F (37 C)] 97.7 F (36.5 C) (08/14 0734) Pulse Rate:  [62-80] 80  (08/14 0734) Resp:  [11-19] 13  (08/14 0734) BP: (127-183)/(55-81) 141/70 mmHg (08/14 0734) SpO2:  [95 %-99 %] 96 % (08/14 0734) Weight:  [84.2 kg (185 lb 10 oz)] 84.2 kg (185 lb 10 oz) March 19, 2023 2000) Weight change:    Intake/Output from previous day: -400 Mar 19, 2023 0701 - 08/14 0700 In: 225 [I.V.:225] Out: 300 [Urine:300] Intake/Output this shift: Total I/O In: -  Out: 400 [Urine:400]  PE: General:alert and oriented Heart:S1S2 RRR, no murmur gallup rub or click Lungs:clear without rales, rhonchi or wheezes Abd:+ BS, soft non tender Ext:no edema, 2+ pedal pulses bil., rt.groin without hematoma.     Lab Results:  Basename 2012-03-18 0947  WBC --  HGB --  HCT --  PLT 291    EKG: Orders placed during the hospital encounter of 12/29/11  . EKG    Studies/Results: PV Intervention 03-18-12 successful directional atherectomy using turbo hock of a focal proximal left SFA lesion for lifestyle limiting claudication     Medications: I have reviewed the patient's current medications.    Marland Kitchen aspirin EC  81 mg Oral Daily  . atorvastatin  20 mg Oral q1800  . clopidogrel  75 mg Oral Q breakfast  . diltiazem  240 mg Oral Daily  . heparin      . heparin      . hydrochlorothiazide  12.5 mg Oral Daily   And  . lisinopril  20 mg Oral Daily  . lidocaine      . metoprolol tartrate  25 mg Oral q morning - 10a  . omega-3 acid ethyl esters  1 g Oral BID  . pantoprazole  40 mg Oral Q1200  . DISCONTD: aspirin  81 mg Oral Daily  . DISCONTD: diazepam  5 mg Oral On Call  . DISCONTD: lisinopril-hydrochlorothiazide  1 tablet Oral BID   Assessment/Plan: Principal Problem:  *PVD, Rt SFA PTA 12/29/11, residual Lt SFA disease now with directional Athrectomy to Lt. SFA March 18, 2012 for  lifestyle limiting claudication Active Problems:  Claudication in peripheral vascular disease, lifestyle limiting  HTN, LVH, Nl LVF May 2010  Normal Myoview, May 2010  PLAN: Labs pending Ambulate, plan to d/c if stable and f/u dopplers then f/u with Dr. Allyson Sabal.  Pt ambulated without pain.   LOS: 1 day   INGOLD,LAURA R 03/02/2012, 9:24 AM

## 2012-03-02 NOTE — Discharge Summary (Signed)
Physician Discharge Summary  Patient ID: Christy Campbell MRN: [id number] DOB/AGE: [birth year] 71 y.o.  Admit date: 03/01/2012 Discharge date: 03/02/2012  Discharge Diagnoses:  Principal Problem:  *PVD, Rt SFA PTA 12/29/11, residual Lt SFA disease now with directional Athrectomy to Lt. SFA 03/01/12 for lifestyle limiting claudication Active Problems:  Claudication in peripheral vascular disease, lifestyle limiting  HTN, LVH, Nl LVF May 2010  Dyslipidemia  Normal Myoview, May 2010   Discharged Condition: good  Procedures: PV angiogram and atherectomy to her left SFA, by Dr. Nanetta Batty, 03/01/2012  Hospital Course:  Ms Primeau is a 71 year old divorced Caucasian female mother of 2 with a history of peripheral vascular occlusive disease. Dr. Allyson Sabal stented her right common iliac and left SFA in the past. He also  performed TurboHawk directional atherectomy of her proximal right SFA June 11 with improvement in her symptoms and ABIs. Other history includes hypertension and hyperlipidemia. She presented for staged proximal left SFA TurboHawk Directional  atherectomy for lifestyle limiting claudication.  Dr. Allyson Sabal performed successful directional atherectomy using turbo hock of a focal proximal left SFA lesion. The patient tolerated the procedure well and was kept overnight on 6500 for monitoring and IV fluids.  The next morning she was stable without complaints and ambulated without any pain.  She was seen by Dr. Rennis Golden and and felt stable and ready for discharge.  She'll follow with Dr. Allyson Sabal after having followup arterial Dopplers done as an outpatient.   Consults: None  Significant Diagnostic Studies: At discharge sodium 140 potassium 4.0 chloride 101 CO2 28 BUN 26 creatinine 0.7 calcium 9.7 glucose 105  Hemoglobin 13.2 hematocrit 39.9 CBC 14.1 platelets 286.   Discharge Exam: Blood pressure 141/70, pulse 80, temperature 97.7 F (36.5 C), temperature source Oral, resp. rate 13,  height 5\' 7"  (1.702 m), weight 84.2 kg (185 lb 10 oz), SpO2 96.00%.   PE: General:alert and oriented  Heart:S1S2 RRR, no murmur gallup rub or click  Lungs:clear without rales, rhonchi or wheezes  Abd:+ BS, soft non tender  Ext:no edema, 2+ pedal pulses bil., rt.groin without hematoma.     Disposition: 01-Home or Self Care   Medication List  As of 03/02/2012  6:16 PM   TAKE these medications         acetaminophen 325 MG tablet   Commonly known as: TYLENOL   Take 325 mg by mouth 2 (two) times daily as needed. For pain      aspirin EC 81 MG EC tablet   Generic drug: aspirin   Take 81 mg by mouth daily.      clopidogrel 75 MG tablet   Commonly known as: PLAVIX   Take 75 mg by mouth daily.      diltiazem 240 MG 24 hr capsule   Commonly known as: DILACOR XR   Take 240 mg by mouth daily.      lisinopril-hydrochlorothiazide 20-12.5 MG per tablet   Commonly known as: PRINZIDE,ZESTORETIC   Take 1 tablet by mouth 2 (two) times daily.      metoprolol tartrate 25 MG tablet   Commonly known as: LOPRESSOR   Take 25 mg by mouth every morning.      multivitamin with minerals Tabs   Take 1 tablet by mouth daily.      omega-3 acid ethyl esters 1 G capsule   Commonly known as: LOVAZA   Take 1 g by mouth 2 (two) times daily.      omeprazole 20 MG capsule   Commonly known  as: PRILOSEC   Take 20 mg by mouth daily as needed. For GERD      OVER THE COUNTER MEDICATION   Take 1 capsule by mouth at bedtime as needed. To help sleep    Tylenol PM Liquid Gels      rosuvastatin 5 MG tablet   Commonly known as: CRESTOR   Take 5 mg by mouth daily.      vitamin C 500 MG tablet   Commonly known as: ASCORBIC ACID   Take 500 mg by mouth daily.           Follow-up Information    Follow up with Runell Gess, MD on 03/28/2012. (at 7:00 AM for lower ext. dopplers for follow up)    Contact information:   [address] [place] [address] [phone]         Follow up with Runell Gess, MD on 03/30/2012. (at 3:15 pm to see Dr. Allyson Sabal)    Contact information:   [address] [place] [address] [phone]         Discharge instructions:  Call [facility] if any bleeding, swelling or drainage at cath site.  May shower, no tub baths for 48 hours for groin sticks.   Heart Healthy diet  No lifting for 3 days  No driving for 3 days    Signed: Tyliek Timberman R 03/02/2012, 6:16 PM

## 2012-03-02 NOTE — Progress Notes (Signed)
Pt. Seen and examined. Agree with the NP/PA-C note as written. Stable today, walking without claudication. Labs WNL, except mild leukocytosis. No fever. Ok for discharge home today. Follow-up with Dr. Allyson Sabal.  Chrystie Nose, MD, [facility] Attending Cardiologist [facility]

## 2013-03-10 ENCOUNTER — Other Ambulatory Visit (HOSPITAL_COMMUNITY): Payer: Self-pay | Admitting: Cardiovascular Disease

## 2013-03-10 ENCOUNTER — Encounter (HOSPITAL_COMMUNITY): Payer: Self-pay | Admitting: Cardiovascular Disease

## 2013-03-10 DIAGNOSIS — I739 Peripheral vascular disease, unspecified: Secondary | ICD-10-CM

## 2013-03-23 ENCOUNTER — Encounter: Payer: Self-pay | Admitting: *Deleted

## 2013-03-24 ENCOUNTER — Encounter: Payer: Self-pay | Admitting: Cardiology

## 2013-03-24 ENCOUNTER — Ambulatory Visit (INDEPENDENT_AMBULATORY_CARE_PROVIDER_SITE_OTHER): Payer: Medicare Other | Admitting: Cardiology

## 2013-03-24 ENCOUNTER — Encounter: Payer: Self-pay | Admitting: Cardiovascular Disease

## 2013-03-24 VITALS — BP 148/88 | HR 67 | Ht 66.5 in | Wt 185.4 lb

## 2013-03-24 DIAGNOSIS — Z136 Encounter for screening for cardiovascular disorders: Secondary | ICD-10-CM

## 2013-03-24 DIAGNOSIS — IMO0001 Reserved for inherently not codable concepts without codable children: Secondary | ICD-10-CM

## 2013-03-24 DIAGNOSIS — F17200 Nicotine dependence, unspecified, uncomplicated: Secondary | ICD-10-CM

## 2013-03-24 DIAGNOSIS — E785 Hyperlipidemia, unspecified: Secondary | ICD-10-CM

## 2013-03-24 DIAGNOSIS — I739 Peripheral vascular disease, unspecified: Secondary | ICD-10-CM

## 2013-03-24 NOTE — Progress Notes (Signed)
03/24/2013 Christy Campbell   11-08-1940  [id number]  Primary Physicia PERRY,LAWRENCE EDWARD, MD Primary Cardiologist: Dr Allyson Sabal  HPI:  Pleasant 72 y/o female followed by Dr Allyson Sabal with a history of PVD. She has had both Rt and Lt SFA PTA and stenting. She is here for 6  Month check up. She denies claudication. She is on a statin but complains of significant myalgias even at a reduced dose of 10 mg 2-3 X week. She still smokes 5-6 cigarettes a day.   Current Outpatient Prescriptions  Medication Sig Dispense Refill  . acetaminophen (TYLENOL) 325 MG tablet Take 325 mg by mouth 2 (two) times daily as needed. For pain      . aspirin (ASPIRIN EC) 81 MG EC tablet Take 81 mg by mouth daily.       . Calcium Carbonate-Vitamin D (CALCIUM 600+D) 600-400 MG-UNIT per tablet Take 1 tablet by mouth daily.      . clopidogrel (PLAVIX) 75 MG tablet Take 75 mg by mouth daily.      Marland Kitchen diltiazem (DILACOR XR) 240 MG 24 hr capsule Take 240 mg by mouth daily.      . Diphenhydramine-APAP, sleep, (TYLENOL PM EXTRA STRENGTH PO) Take by mouth at bedtime as needed.      Marland Kitchen lisinopril-hydrochlorothiazide (PRINZIDE,ZESTORETIC) 20-12.5 MG per tablet Take 1 tablet by mouth 2 (two) times daily.      . metoprolol tartrate (LOPRESSOR) 25 MG tablet Take 25 mg by mouth every morning.      . Multiple Vitamin (MULITIVITAMIN WITH MINERALS) TABS Take 1 tablet by mouth daily.      Marland Kitchen omega-3 acid ethyl esters (LOVAZA) 1 G capsule Take 1 g by mouth 2 (two) times daily.      Marland Kitchen omeprazole (PRILOSEC) 20 MG capsule Take 20 mg by mouth daily as needed. For GERD      . rosuvastatin (CRESTOR) 5 MG tablet Take 5 mg by mouth daily.      . vitamin C (ASCORBIC ACID) 500 MG tablet Take 500 mg by mouth daily.       No current facility-administered medications for this visit.    Allergies  Allergen Reactions  . Aspirin Nausea And Vomiting and Other (See Comments)    "pain; hurting"  . Tetanus Toxoids Anaphylaxis and Swelling  . Pravastatin    myalgia    History   Social History  . Marital Status: Married    Spouse Name: N/A    Number of Children: N/A  . Years of Education: N/A   Occupational History  . Not on file.   Social History Main Topics  . Smoking status: Current Some Day Smoker -- 0.50 packs/day for 10 years    Types: Cigarettes  . Smokeless tobacco: Never Used  . Alcohol Use: No  . Drug Use: No  . Sexual Activity: Yes   Other Topics Concern  . Not on file   Social History Narrative  . No narrative on file     Review of Systems: General: negative for chills, fever, night sweats or weight changes.  Cardiovascular: negative for chest pain, dyspnea on exertion, edema, orthopnea, palpitations, paroxysmal nocturnal dyspnea or shortness of breath Dermatological: negative for rash Respiratory: negative for cough or wheezing Urologic: negative for hematuria Abdominal: negative for nausea, vomiting, diarrhea, bright red blood per rectum, melena, or hematemesis Neurologic: negative for visual changes, syncope, or dizziness All other systems reviewed and are otherwise negative except as noted above.    Blood pressure 148/88, pulse  67, height 5' 6.5" (1.689 m), weight 185 lb 6.4 oz (84.097 kg).  General appearance: alert, cooperative and no distress Lungs: clear to auscultation bilaterally Heart: regular rate and rhythm Extremities: no edema, intact DP pulses  EKG NSR without acute changes  ASSESSMENT AND PLAN:   PVD, Rt SFA PTA 12/29/11, residual Lt SFA disease now with directional Athrectomy to Lt. SFA 03/01/12 for lifestyle limiting claudication No claudication  Smoker Less than 1/2 pk/ day  Normal Myoview, May 2010 .  Dyslipidemia She has been on Crestor 10 mg 2-3 X week. She has complaints of myalgias with this dose.   PLAN  We discussed the importance of smoking cessation. I suggested she cut her Crestor back to 5 mg 3 X week, (her LDL in July 2013 was 85). She will get repeat lipids before  she sees Dr Allyson Sabal in 6  Months.  [facility] KPA-C 03/24/2013 3:56 PM

## 2013-03-24 NOTE — Patient Instructions (Addendum)
Your physician wants you to follow-up in: 6 months with Dr. Allyson Sabal. You will receive a reminder letter in the mail two months in advance. If you don't receive a letter, please call our office to schedule the follow-up appointment.   Please have lab work done at your primary care doctor in 6 months, prior to you office visit with Dr. Allyson Sabal. Please have them fax the results to [phone]. Thank you.  You will need to fast for this blood work. Lipid Panel, Hepatic Panel  Take 5mg  Crestor.

## 2013-03-24 NOTE — Assessment & Plan Note (Signed)
No claudication.  ?

## 2013-03-24 NOTE — Assessment & Plan Note (Signed)
She has been on Crestor 10 mg 2-3 X week. She has complaints of myalgias with this dose.

## 2013-03-24 NOTE — Assessment & Plan Note (Signed)
Less than 1/2 pk/ day

## 2013-03-31 ENCOUNTER — Ambulatory Visit (HOSPITAL_COMMUNITY)
Admission: RE | Admit: 2013-03-31 | Discharge: 2013-03-31 | Disposition: A | Discharge status: Home / Self Care | Payer: Medicare Other | Source: Ambulatory Visit | Attending: Cardiovascular Disease | Admitting: Cardiovascular Disease

## 2013-03-31 DIAGNOSIS — I739 Peripheral vascular disease, unspecified: Secondary | ICD-10-CM

## 2013-03-31 DIAGNOSIS — I70219 Atherosclerosis of native arteries of extremities with intermittent claudication, unspecified extremity: Secondary | ICD-10-CM

## 2013-03-31 NOTE — Progress Notes (Signed)
Arterial Duplex Lower Ext. Completed. Nadege Carriger, BS, RDMS, RVT  

## 2013-07-21 ENCOUNTER — Other Ambulatory Visit: Payer: Self-pay | Admitting: Ophthalmology

## 2013-07-24 ENCOUNTER — Encounter (HOSPITAL_COMMUNITY): Payer: Self-pay | Admitting: Respiratory Therapy

## 2013-07-26 ENCOUNTER — Encounter (HOSPITAL_COMMUNITY): Payer: Self-pay | Admitting: *Deleted

## 2013-07-27 ENCOUNTER — Encounter (HOSPITAL_COMMUNITY): Payer: Self-pay | Admitting: Surgery

## 2013-07-27 ENCOUNTER — Ambulatory Visit (HOSPITAL_COMMUNITY): Payer: Medicare Other | Admitting: Certified Registered Nurse Anesthetist

## 2013-07-27 ENCOUNTER — Ambulatory Visit (HOSPITAL_COMMUNITY): Payer: Medicare Other

## 2013-07-27 ENCOUNTER — Encounter (HOSPITAL_COMMUNITY): Payer: Medicare Other | Admitting: Certified Registered Nurse Anesthetist

## 2013-07-27 ENCOUNTER — Encounter (HOSPITAL_COMMUNITY)
Admission: RE | Disposition: A | Discharge status: Home / Self Care | Payer: Self-pay | Source: Ambulatory Visit | Attending: Ophthalmology

## 2013-07-27 ENCOUNTER — Ambulatory Visit (HOSPITAL_COMMUNITY)
Admission: RE | Admit: 2013-07-27 | Discharge: 2013-07-27 | Disposition: A | Discharge status: Home / Self Care | Payer: Medicare Other | Source: Ambulatory Visit | Attending: Ophthalmology | Admitting: Ophthalmology

## 2013-07-27 DIAGNOSIS — H348392 Tributary (branch) retinal vein occlusion, unspecified eye, stable: Secondary | ICD-10-CM | POA: Insufficient documentation

## 2013-07-27 DIAGNOSIS — I10 Essential (primary) hypertension: Secondary | ICD-10-CM | POA: Insufficient documentation

## 2013-07-27 DIAGNOSIS — H3520 Other non-diabetic proliferative retinopathy, unspecified eye: Secondary | ICD-10-CM | POA: Insufficient documentation

## 2013-07-27 DIAGNOSIS — H4310 Vitreous hemorrhage, unspecified eye: Secondary | ICD-10-CM | POA: Insufficient documentation

## 2013-07-27 DIAGNOSIS — K219 Gastro-esophageal reflux disease without esophagitis: Secondary | ICD-10-CM | POA: Insufficient documentation

## 2013-07-27 DIAGNOSIS — H4311 Vitreous hemorrhage, right eye: Secondary | ICD-10-CM

## 2013-07-27 DIAGNOSIS — F17200 Nicotine dependence, unspecified, uncomplicated: Secondary | ICD-10-CM | POA: Insufficient documentation

## 2013-07-27 DIAGNOSIS — Z7982 Long term (current) use of aspirin: Secondary | ICD-10-CM | POA: Insufficient documentation

## 2013-07-27 HISTORY — DX: Gastro-esophageal reflux disease without esophagitis: K21.9

## 2013-07-27 HISTORY — PX: LASER PHOTO ABLATION: SHX5942

## 2013-07-27 HISTORY — DX: Anemia, unspecified: D64.9

## 2013-07-27 HISTORY — DX: Calculus of kidney: N20.0

## 2013-07-27 HISTORY — PX: PARS PLANA VITRECTOMY: SHX2166

## 2013-07-27 LAB — BASIC METABOLIC PANEL
BUN: 21 mg/dL (ref 6–23)
CO2: 25 meq/L (ref 19–32)
Calcium: 9.2 mg/dL (ref 8.4–10.5)
Chloride: 99 mEq/L (ref 96–112)
Creatinine, Ser: 0.81 mg/dL (ref 0.50–1.10)
GFR calc Af Amer: 82 mL/min — ABNORMAL LOW (ref >90)
GFR calc non Af Amer: 71 mL/min — ABNORMAL LOW (ref >90)
Glucose, Bld: 98 mg/dL (ref 70–99)
Potassium: 3.8 mEq/L (ref 3.7–5.3)
SODIUM: 139 meq/L (ref 137–147)

## 2013-07-27 LAB — CBC
HEMATOCRIT: 39.2 % (ref 36.0–46.0)
Hemoglobin: 13.2 g/dL (ref 12.0–15.0)
MCH: 27.7 pg (ref 26.0–34.0)
MCHC: 33.7 g/dL (ref 30.0–36.0)
MCV: 82.4 fL (ref 78.0–100.0)
Platelets: 309 10*3/uL (ref 150–400)
RBC: 4.76 MIL/uL (ref 3.87–5.11)
RDW: 14.6 % (ref 11.5–15.5)
WBC: 10.4 10*3/uL (ref 4.0–10.5)

## 2013-07-27 SURGERY — PARS PLANA VITRECTOMY WITH 25 GAUGE
Anesthesia: Monitor Anesthesia Care | Laterality: Right

## 2013-07-27 MED ORDER — SODIUM CHLORIDE 0.9 % IV SOLN
INTRAVENOUS | Status: DC | PRN
  Administered 2013-07-27: 17:00:00 via INTRAVENOUS

## 2013-07-27 MED ORDER — SODIUM CHLORIDE 0.9 % IJ SOLN
INTRAMUSCULAR | Status: AC
  Filled 2013-07-27: qty 10

## 2013-07-27 MED ORDER — HYPROMELLOSE (GONIOSCOPIC) 2.5 % OP SOLN
OPHTHALMIC | Status: DC | PRN
  Administered 2013-07-27: 2 [drp] via OPHTHALMIC

## 2013-07-27 MED ORDER — PROPOFOL 10 MG/ML IV BOLUS
INTRAVENOUS | Status: DC | PRN
  Administered 2013-07-27: 60 mg via INTRAVENOUS

## 2013-07-27 MED ORDER — TETRACAINE HCL 0.5 % OP SOLN
OPHTHALMIC | Status: AC
  Filled 2013-07-27: qty 2

## 2013-07-27 MED ORDER — SODIUM CHLORIDE 0.9 % IV SOLN
INTRAVENOUS | Status: DC
  Administered 2013-07-27: 14:00:00 via INTRAVENOUS

## 2013-07-27 MED ORDER — NA CHONDROIT SULF-NA HYALURON 40-30 MG/ML IO SOLN
INTRAOCULAR | Status: AC
  Filled 2013-07-27: qty 0.5

## 2013-07-27 MED ORDER — LIDOCAINE HCL (CARDIAC) 20 MG/ML IV SOLN
INTRAVENOUS | Status: DC | PRN
  Administered 2013-07-27: 20 mg via INTRAVENOUS

## 2013-07-27 MED ORDER — CYCLOPENTOLATE HCL 1 % OP SOLN
1.0000 [drp] | OPHTHALMIC | Status: AC
  Administered 2013-07-27 (×3): 1 [drp] via OPHTHALMIC
  Filled 2013-07-27: qty 2

## 2013-07-27 MED ORDER — HYPROMELLOSE (GONIOSCOPIC) 2.5 % OP SOLN
OPHTHALMIC | Status: AC
  Filled 2013-07-27: qty 15

## 2013-07-27 MED ORDER — GENTAMICIN SULFATE 40 MG/ML IJ SOLN
INTRAMUSCULAR | Status: AC
  Filled 2013-07-27: qty 2

## 2013-07-27 MED ORDER — POLYMYXIN B SULFATE 500000 UNITS IJ SOLR
INTRAMUSCULAR | Status: AC
  Filled 2013-07-27: qty 1

## 2013-07-27 MED ORDER — BSS PLUS IO SOLN
INTRAOCULAR | Status: DC | PRN
  Administered 2013-07-27: 1 via INTRAOCULAR

## 2013-07-27 MED ORDER — EPINEPHRINE HCL 1 MG/ML IJ SOLN
INTRAMUSCULAR | Status: DC | PRN
  Administered 2013-07-27: 1 mg

## 2013-07-27 MED ORDER — EPINEPHRINE HCL 1 MG/ML IJ SOLN
INTRAMUSCULAR | Status: AC
  Filled 2013-07-27: qty 1

## 2013-07-27 MED ORDER — PHENYLEPHRINE HCL 2.5 % OP SOLN
1.0000 [drp] | OPHTHALMIC | Status: AC
  Administered 2013-07-27: 1 [drp] via OPHTHALMIC
  Administered 2013-07-27: 14:00:00 via OPHTHALMIC
  Administered 2013-07-27: 1 [drp] via OPHTHALMIC

## 2013-07-27 MED ORDER — GATIFLOXACIN 0.5 % OP SOLN
1.0000 [drp] | OPHTHALMIC | Status: AC
  Administered 2013-07-27 (×3): 1 [drp] via OPHTHALMIC
  Filled 2013-07-27: qty 2.5

## 2013-07-27 MED ORDER — LIDOCAINE HCL 2 % IJ SOLN
INTRAMUSCULAR | Status: DC | PRN
  Administered 2013-07-27: 10 mL

## 2013-07-27 MED ORDER — CEFAZOLIN SODIUM-DEXTROSE 2-3 GM-% IV SOLR
INTRAVENOUS | Status: AC
  Filled 2013-07-27: qty 50

## 2013-07-27 MED ORDER — PHENYLEPHRINE HCL 2.5 % OP SOLN
OPHTHALMIC | Status: AC
  Filled 2013-07-27: qty 15

## 2013-07-27 MED ORDER — DEXAMETHASONE SODIUM PHOSPHATE 10 MG/ML IJ SOLN
INTRAMUSCULAR | Status: AC
  Filled 2013-07-27: qty 1

## 2013-07-27 MED ORDER — DEXAMETHASONE SODIUM PHOSPHATE 10 MG/ML IJ SOLN
INTRAMUSCULAR | Status: DC | PRN
  Administered 2013-07-27: 10 mg via INTRAVENOUS

## 2013-07-27 MED ORDER — LIDOCAINE HCL 2 % IJ SOLN
INTRAMUSCULAR | Status: AC
  Filled 2013-07-27: qty 20

## 2013-07-27 MED ORDER — BSS PLUS IO SOLN
INTRAOCULAR | Status: AC
  Filled 2013-07-27: qty 500

## 2013-07-27 MED ORDER — SODIUM HYALURONATE 10 MG/ML IO SOLN
INTRAOCULAR | Status: AC
  Filled 2013-07-27: qty 0.85

## 2013-07-27 SURGICAL SUPPLY — 58 items
APPLICATOR COTTON TIP 6IN STRL (MISCELLANEOUS) ×2 IMPLANT
APPLICATOR DR MATTHEWS STRL (MISCELLANEOUS) IMPLANT
BLADE MVR KNIFE 20G (BLADE) IMPLANT
CANNULA ANT CHAM MAIN (OPHTHALMIC RELATED) IMPLANT
CANNULA VLV SOFT TIP 25GA (OPHTHALMIC) ×2 IMPLANT
CORDS BIPOLAR (ELECTRODE) IMPLANT
COVER MAYO STAND STRL (DRAPES) ×2 IMPLANT
DRAPE INCISE 51X51 W/FILM STRL (DRAPES) IMPLANT
DRAPE OPHTHALMIC 77X100 STRL (CUSTOM PROCEDURE TRAY) ×2 IMPLANT
FILTER BLUE MILLIPORE (MISCELLANEOUS) IMPLANT
FORCEPS ECKARDT ILM 25G SERR (OPHTHALMIC RELATED) IMPLANT
FORCEPS GRIESHABER ILM 25G A (INSTRUMENTS) IMPLANT
FORCEPS HORIZONTAL 25G DISP (OPHTHALMIC RELATED) IMPLANT
FORCEPS ILM 25G DSP TIP (MISCELLANEOUS) IMPLANT
GAS AUTO FILL CONSTEL (OPHTHALMIC)
GAS AUTO FILL CONSTELLATION (OPHTHALMIC) IMPLANT
GLOVE SS BIOGEL STRL SZ 8.5 (GLOVE) ×1 IMPLANT
GLOVE SUPERSENSE BIOGEL SZ 8.5 (GLOVE) ×1
GOWN PREVENTION PLUS XLARGE (GOWN DISPOSABLE) ×2 IMPLANT
GOWN STRL NON-REIN LRG LVL3 (GOWN DISPOSABLE) ×2 IMPLANT
HANDLE PNEUMATIC FOR CONSTEL (OPHTHALMIC) IMPLANT
KIT BASIN OR (CUSTOM PROCEDURE TRAY) ×2 IMPLANT
KIT PERFLUORON PROCEDURE 5ML (MISCELLANEOUS) IMPLANT
KIT ROOM TURNOVER OR (KITS) ×2 IMPLANT
KNIFE CRESCENT 2.5 55 ANG (BLADE) IMPLANT
LENS BIOM SUPER VIEW SET DISP (OPHTHALMIC RELATED) ×2 IMPLANT
MASK EYE SHIELD (GAUZE/BANDAGES/DRESSINGS) IMPLANT
MICROPICK 25G (MISCELLANEOUS)
NEEDLE 18GX1X1/2 (RX/OR ONLY) (NEEDLE) IMPLANT
NEEDLE 25GX 5/8IN NON SAFETY (NEEDLE) IMPLANT
NEEDLE FILTER BLUNT 18X 1/2SAF (NEEDLE)
NEEDLE FILTER BLUNT 18X1 1/2 (NEEDLE) IMPLANT
NEEDLE HYPO 25GX1X1/2 BEV (NEEDLE) IMPLANT
NS IRRIG 1000ML POUR BTL (IV SOLUTION) ×2 IMPLANT
PACK FRAGMATOME (OPHTHALMIC) IMPLANT
PACK VITRECTOMY CUSTOM (CUSTOM PROCEDURE TRAY) ×2 IMPLANT
PAD ARMBOARD 7.5X6 YLW CONV (MISCELLANEOUS) ×4 IMPLANT
PAD EYE OVAL STERILE LF (GAUZE/BANDAGES/DRESSINGS) IMPLANT
PAK PIK VITRECTOMY CVS 25GA (OPHTHALMIC) ×2 IMPLANT
PENCIL BIPOLAR 25GA STR DISP (OPHTHALMIC RELATED) IMPLANT
PICK MICROPICK 25G (MISCELLANEOUS) IMPLANT
PROBE LASER ILLUM FLEX CVD 25G (OPHTHALMIC) ×2 IMPLANT
ROLLS DENTAL (MISCELLANEOUS) IMPLANT
SCRAPER DIAMOND 25GA (OPHTHALMIC RELATED) IMPLANT
SET FLUID INJECTOR (SET/KITS/TRAYS/PACK) IMPLANT
STOCKINETTE IMPERVIOUS 9X36 MD (GAUZE/BANDAGES/DRESSINGS) ×4 IMPLANT
STOPCOCK 4 WAY LG BORE MALE ST (IV SETS) IMPLANT
SUT ETHILON 10 0 CS140 6 (SUTURE) IMPLANT
SUT ETHILON 8 0 BV130 4 (SUTURE) IMPLANT
SUT MERSILENE 5 0 RD 1 DA (SUTURE) IMPLANT
SUT PROLENE 10 0 CIF 4 DA (SUTURE) IMPLANT
SUT VICRYL 7 0 TG140 8 (SUTURE) IMPLANT
SYR 30ML SLIP (SYRINGE) IMPLANT
SYR 5ML LL (SYRINGE) IMPLANT
SYR TB 1ML LUER SLIP (SYRINGE) IMPLANT
TOWEL OR 17X26 10 PK STRL BLUE (TOWEL DISPOSABLE) ×4 IMPLANT
WATER STERILE IRR 1000ML POUR (IV SOLUTION) ×2 IMPLANT
WIPE INSTRUMENT VISIWIPE 73X73 (MISCELLANEOUS) ×2 IMPLANT

## 2013-07-27 NOTE — Transfer of Care (Signed)
Immediate Anesthesia Transfer of Care Note  Patient: Christy Campbell  Procedure(s) Performed: Procedure(s): PARS PLANA VITRECTOMY WITH 25 GAUGE (Right) LASER PHOTO ABLATION (Right)  Patient Location: PACU  Anesthesia Type:MAC  Level of Consciousness: awake, alert  and oriented  Airway & Oxygen Therapy: Patient Spontanous Breathing  Post-op Assessment: Report given to PACU RN and Post -op Vital signs reviewed and stable  Post vital signs: Reviewed and stable  Complications: No apparent anesthesia complications

## 2013-07-27 NOTE — Preoperative (Signed)
Beta Blockers   Reason not to administer Beta Blockers:Not Applicable  AM metoprolol

## 2013-07-27 NOTE — Anesthesia Preprocedure Evaluation (Addendum)
Anesthesia Evaluation  Patient identified by MRN, date of birth, ID band Patient awake    Reviewed: Allergy & Precautions, H&P , NPO status , Patient's Chart, lab work & pertinent test results, reviewed documented beta blocker date and time   Airway Mallampati: II TM Distance: >3 FB Neck ROM: Full    Dental  (+) Edentulous Upper and Dental Advisory Given   Pulmonary Current Smoker,          Cardiovascular hypertension, Pt. on medications + Peripheral Vascular Disease     Neuro/Psych    GI/Hepatic GERD-  ,  Endo/Other    Renal/GU Renal disease     Musculoskeletal   Abdominal   Peds  Hematology  (+) anemia ,   Anesthesia Other Findings   Reproductive/Obstetrics                          Anesthesia Physical Anesthesia Plan  ASA: II  Anesthesia Plan: MAC   Post-op Pain Management:    Induction: Intravenous  Airway Management Planned: Simple Face Mask  Additional Equipment:   Intra-op Plan:   Post-operative Plan:   Informed Consent: I have reviewed the patients History and Physical, chart, labs and discussed the procedure including the risks, benefits and alternatives for the proposed anesthesia with the patient or authorized representative who has indicated his/her understanding and acceptance.   Dental advisory given  Plan Discussed with: CRNA, Anesthesiologist and Surgeon  Anesthesia Plan Comments:        Anesthesia Quick Evaluation

## 2013-07-27 NOTE — Discharge Instructions (Signed)

## 2013-07-27 NOTE — H&P (Signed)
Christy Campbell is an 73 y.o. female.   Chief Complaint: Decreased vision right eye HPI: Dense vitreous hemorrhage right eye, nonclearing. Most likely related to underlying history of retinal vein occlusion.  Past Medical History  Diagnosis Date  . PVD (peripheral vascular disease) 12/29/2011    h/o right common iliac artery stent, left SFA stent and left SFA directional atherectomy 02/29/12  . Abdominal pain, post PV angio, Possibly secondary to ASA on empty stomach 12/29/2011  . Claudication in peripheral vascular disease 12/29/2011  . Hypertension     echo 12/06/08-EF>55%, mild AS and mild pulmonary htn  . History of stomach ulcers 03/01/12    "little bitty ones"  . Arthritis     "back; right knee and ankle"  . Tobacco abuse   . Dyslipidemia   . H/O cardiovascular stress test 12/06/08    normal, no ischemia  . Kidney stones   . GERD (gastroesophageal reflux disease)   . Anemia     "long time ago"    Past Surgical History  Procedure Laterality Date  . Peripheral arterial stent graft  03/01/2012    successful directional atheretomy using turbo hawk of a focal prox left SFA lesion   . Atherectomy  03/01/2012  . Inguinal hernia repair  1990's    right  . Knee arthroscopy  1990's    bilaterally  . Vaginal hysterectomy  1970's  . Tubal ligation  1960's  . Pv angiogram  12/29/11    successful directinal atherectomy using the turbo hawk to righ SFA stenosis  . Pv angiogram  04/17/10    successful PTA/stenting of the left superficial femoral artery and right iliac  . Colonoscopy    . Eye surgery      Vitrectomy, and bilateral cataracts  . Appendectomy    . Cholecystectomy      History reviewed. No pertinent family history. Social History:  reports that she has been smoking Cigarettes.  She has a 5 pack-year smoking history. She has never used smokeless tobacco. She reports that she does not drink alcohol or use illicit drugs.  Allergies:  Allergies  Allergen Reactions  .  Aspirin Nausea And Vomiting and Other (See Comments)    "pain; hurting"  . Tetanus Toxoids Anaphylaxis and Swelling  . Pravastatin     myalgia    Medications Prior to Admission  Medication Sig Dispense Refill  . acetaminophen (TYLENOL) 325 MG tablet Take 325 mg by mouth 2 (two) times daily as needed. For pain      . aspirin (ASPIRIN EC) 81 MG EC tablet Take 81 mg by mouth daily.       . clopidogrel (PLAVIX) 75 MG tablet Take 75 mg by mouth daily.      Marland Kitchen diltiazem (DILACOR XR) 240 MG 24 hr capsule Take 240 mg by mouth daily.      . Diphenhydramine-APAP, sleep, (TYLENOL PM EXTRA STRENGTH PO) Take by mouth at bedtime as needed.      Marland Kitchen lisinopril-hydrochlorothiazide (PRINZIDE,ZESTORETIC) 20-12.5 MG per tablet Take 1 tablet by mouth 2 (two) times daily.      . metoprolol tartrate (LOPRESSOR) 25 MG tablet Take 25 mg by mouth every morning.      . Multiple Vitamin (MULITIVITAMIN WITH MINERALS) TABS Take 1 tablet by mouth daily.      Marland Kitchen omega-3 acid ethyl esters (LOVAZA) 1 G capsule Take 1 g by mouth 2 (two) times daily.      Marland Kitchen omeprazole (PRILOSEC) 20 MG capsule Take 20 mg  by mouth daily as needed. For GERD      . rosuvastatin (CRESTOR) 5 MG tablet Take 5 mg by mouth daily.      . vitamin C (ASCORBIC ACID) 500 MG tablet Take 500 mg by mouth daily.      . Calcium Carbonate-Vitamin D (CALCIUM 600+D) 600-400 MG-UNIT per tablet Take 1 tablet by mouth daily.        Results for orders placed during the hospital encounter of 07/27/13 (from the past 48 hour(s))  BASIC METABOLIC PANEL     Status: Abnormal   Collection Time    07/27/13  1:49 PM      Result Value Range   Sodium 139  137 - 147 mEq/L   Potassium 3.8  3.7 - 5.3 mEq/L   Chloride 99  96 - 112 mEq/L   CO2 25  19 - 32 mEq/L   Glucose, Bld 98  70 - 99 mg/dL   BUN 21  6 - 23 mg/dL   Creatinine, Ser 0.81  0.50 - 1.10 mg/dL   Calcium 9.2  8.4 - 10.5 mg/dL   GFR calc non Af Amer 71 (*) >90 mL/min   GFR calc Af Amer 82 (*) >90 mL/min    Comment: (NOTE)     The eGFR has been calculated using the CKD EPI equation.     This calculation has not been validated in all clinical situations.     eGFR's persistently <90 mL/min signify possible Chronic Kidney     Disease.  CBC     Status: None   Collection Time    07/27/13  1:49 PM      Result Value Range   WBC 10.4  4.0 - 10.5 K/uL   RBC 4.76  3.87 - 5.11 MIL/uL   Hemoglobin 13.2  12.0 - 15.0 g/dL   HCT 39.2  36.0 - 46.0 %   MCV 82.4  78.0 - 100.0 fL   MCH 27.7  26.0 - 34.0 pg   MCHC 33.7  30.0 - 36.0 g/dL   RDW 14.6  11.5 - 15.5 %   Platelets 309  150 - 400 K/uL   Dg Chest 2 View  07/27/2013   CLINICAL DATA:  Preop for eye surgery today  EXAM: CHEST  2 VIEW  COMPARISON:  11/30/2011  FINDINGS: The heart size and vascular pattern are normal. Small calcified granuloma adjacent to the aortic arch is stable. No infiltrate or effusion. Mild uncoiling of the aorta is stable. Degenerative changes in the thoracic spine are stable.  IMPRESSION: No active cardiopulmonary disease.   Electronically Signed   By: Skipper Cliche M.D.   On: 07/27/2013 15:03    Review of Systems  Constitutional: Negative.   HENT: Negative.   Eyes: Positive for blurred vision.  Respiratory: Negative.   Cardiovascular: Negative.   Gastrointestinal: Negative.   Musculoskeletal: Negative.   Skin: Negative.   Endo/Heme/Allergies: Negative.   Psychiatric/Behavioral: Negative.     Blood pressure 201/83, pulse 74, temperature 98.3 F (36.8 C), temperature source Oral, resp. rate 18, height 5' 6"  (1.676 m), weight 83.093 kg (183 lb 3 oz), SpO2 99.00%. Physical Exam  Constitutional: She is oriented to person, place, and time. Vital signs are normal. She appears well-developed and well-nourished.  HENT:  Head: Normocephalic.  Eyes: Conjunctivae, EOM and lids are normal. Pupils are equal, round, and reactive to light. Lids are everted and swept, no foreign bodies found.    Vision right eye hand motion. Vision  left  eye 20/20  Fundus examination : Dense vitreous hemorrhage right eye nonclearing  Neck: Normal range of motion. Neck supple.  Cardiovascular: Normal rate and regular rhythm.   Respiratory: Effort normal and breath sounds normal.  Musculoskeletal: Normal range of motion.  Neurological: She is alert and oriented to person, place, and time. She has normal reflexes.  Skin: Skin is warm and dry.  Psychiatric: She has a normal mood and affect. Her speech is normal and behavior is normal. Judgment and thought content normal. Cognition and memory are normal.     Assessment/Plan Dense vitreous hemorrhage right eye, nonclearing. Most likely related to underlying history of retinal vein occlusion. Plan. Vitrectomy with endolaser panretinal photocoagulation in order to clear the vitreous opacification and treat the underlying condition. Patient understands the risk of anesthesia but also to the eye from the underlying condition including but not limited to hemorrhage, infection, scarring, need for another surgery. Anesthesia is local retrobulbar  with monitored anesthesia control planned   [facility] A 07/27/2013, 4:08 PM

## 2013-07-27 NOTE — Brief Op Note (Signed)
07/27/2013  5:19 PM  PATIENT:  Christy Campbell  73 y.o. female  PRE-OPERATIVE DIAGNOSIS:  DENSE VITREOUS HEMORRHAGE OF THE RIGHT EYE  POST-OPERATIVE DIAGNOSIS:  Dense Vitreous Hemorrhage of the Right eye, branch retinal vein occlusion.  NON diabetic proliferative retinopathy  PROCEDURE:  Procedure(s): PARS PLANA VITRECTOMY WITH 25 GAUGE (Right) LASER PHOTO ABLATION (Right)  SURGEON:  Surgeon(s) and Role:    * Edmon Crape, MD - Primary  PHYSICIAN ASSISTANT:   ASSISTANTS: none   ANESTHESIA:   MAC  EBL:  Total I/O In: 200 [I.V.:200] Out: -   BLOOD ADMINISTERED:none  DRAINS: none   LOCAL MEDICATIONS USED:  LIDOCAINE 10 ccs retrobulbar  SPECIMEN:  No Specimen  DISPOSITION OF SPECIMEN:  N/A  COUNTS:  YES  TOURNIQUET:  * No tourniquets in log *  DICTATION: .Other Dictation: Dictation Number [id number]  PLAN OF CARE: Discharge to home after PACU  PATIENT DISPOSITION:  PACU - hemodynamically stable.   Delay start of Pharmacological VTE agent (>24hrs) due to surgical blood loss or risk of bleeding: not applicable

## 2013-07-28 NOTE — Op Note (Signed)
Christy Campbell, Christy Campbell             ACCOUNT NO.:  [id number]  MEDICAL RECORD NO.:  [id number]  LOCATION:  [facility]                         FACILITY:  [facility]  PHYSICIAN:  Jillyn Hidden A. Rankin, M.D.   DATE OF BIRTH:  [birth year]  DATE OF PROCEDURE:  07/27/2013 DATE OF DISCHARGE:  07/27/2013                              OPERATIVE REPORT   PREOPERATIVE DIAGNOSIS:  Dense vitreous hemorrhage of the right eye - nonclearing.  PREOPERATIVE DIAGNOSES: 1. Dense vitreous hemorrhage of the right eye - nonclearing. 2. Branch retinal vein occlusion, right eye. 3. Nondiabetic proliferative retinopathy, right eye.  PROCEDURE:  Posterior vitrectomy with endolaser photocoagulation, right eye.  SURGEON:  Alford Highland. Rankin, M.D.  ANESTHESIA:  Local retrobulbar with monitored anesthesia control.  INDICATION FOR PROCEDURE:  The patient is a 73 year old woman who has profound vision loss on the right eye on the basis of dense nonclearing vitreous hemorrhage.  She understands this in an attempt to release and remove the vitreous opacification so as to maximize and recover visual acuity.  She understands the risks of anesthesia including rare recurrence of death, loss of the eye from the underlying condition including but not limited to hemorrhage, infection, scarring, need for another surgery, change in vision, loss of vision, progressive disease despite intervention.  Appropriate signed consent was obtained.  DESCRIPTION OF PROCEDURE:  The patient was taken to the operating room. In the operating room, appropriate monitors were followed by mild sedation.  Appropriate sites were confirmed in the operating eye over the right eye.  Thereafter under mild sedation, 2% Xylocaine, 5 mL injected retrobulbar with additional 5 mL laterally in the fashion of modified Darel Hong.  The right periocular region was sterilely prepped and draped in usual ophthalmic fashion.  Lid speculum was applied.  A 25- gauge trocar was placed  in the inferotemporal quadrant.  Placement was verified visually.  Infusion turned on.  Superior trocar was applied. Core vitrectomy was then begun, had been previously done.  There were some remnants of the peripheral vitreous in the inferior quadrants with some inspissated vitreous hemorrhage.  This was trimmed.  Intraocular pressure was then used in this area to trim this area.  Vitreous opacification was removed in this fashion.  The previous laser photocoagulation and superotemporal arcade was identified.  Adequate vascularization was done in the far temporal peripherally.  Endolaser photocoagulation was placed in this area as well as anterior to the equator inferiorly.  No complications were occurred.  Hemostasis was spontaneous.  Instruments were removed from the eye.  Few of the trocars were superiorly removed.  The infusion was then removed.  Subconjunctival Decadron applied.  Sterile patch and Fox shield were applied lightly.  The patient tolerated the procedure without complications, taken to the PACU.     Alford Highland Rankin, M.D.     GAR/MEDQ  D:  07/27/2013  T:  07/28/2013  Job:  [id number]

## 2013-07-28 NOTE — Anesthesia Postprocedure Evaluation (Signed)
  Anesthesia Post-op Note  Patient: Christy Campbell  Procedure(s) Performed: Procedure(s): PARS PLANA VITRECTOMY WITH 25 GAUGE (Right) LASER PHOTO ABLATION (Right)  Patient Location: PACU  Anesthesia Type:MAC  Level of Consciousness: awake, alert , oriented and patient cooperative  Airway and Oxygen Therapy: Patient Spontanous Breathing  Post-op Pain: none  Post-op Assessment: Post-op Vital signs reviewed, Patient's Cardiovascular Status Stable, Respiratory Function Stable, Patent Airway, No signs of Nausea or vomiting and Pain level controlled  Post-op Vital Signs: stable  Complications: No apparent anesthesia complications

## 2013-07-31 ENCOUNTER — Encounter (HOSPITAL_COMMUNITY): Payer: Self-pay | Admitting: Ophthalmology

## 2013-10-03 ENCOUNTER — Ambulatory Visit (INDEPENDENT_AMBULATORY_CARE_PROVIDER_SITE_OTHER): Payer: Medicare Other | Admitting: Cardiovascular Disease

## 2013-10-03 ENCOUNTER — Encounter: Payer: Self-pay | Admitting: Cardiovascular Disease

## 2013-10-03 VITALS — BP 138/80 | HR 70 | Ht 67.0 in | Wt 187.7 lb

## 2013-10-03 DIAGNOSIS — E785 Hyperlipidemia, unspecified: Secondary | ICD-10-CM

## 2013-10-03 DIAGNOSIS — I739 Peripheral vascular disease, unspecified: Secondary | ICD-10-CM

## 2013-10-03 DIAGNOSIS — I10 Essential (primary) hypertension: Secondary | ICD-10-CM

## 2013-10-03 NOTE — Assessment & Plan Note (Signed)
Well-controlled on current medications 

## 2013-10-03 NOTE — Assessment & Plan Note (Signed)
Status post left SFA directional atherectomy by myself 03/01/12. She had a prior mid left SFA stent with three-vessel runoff. Her most recent lower extremity Dopplers performed 03/31/13 revealed a right ABI of 1.0, left 0.90 with a widely patent left SFA. She denies claudication.

## 2013-10-03 NOTE — Progress Notes (Signed)
10/03/2013 Christy Campbell   01-23-1941  [id number]  Primary Physician PERRY,LAWRENCE Ramon DredgeEDWARD, MD Primary Cardiologist: Runell GessJonathan J. Berry MD Roseanne RenoFACP,FACC,FAHA, FSCAI   HPI:  The patient is a very pleasant, 73 year old, mild to moderately overweight, married Caucasian female, mother of 2, grandmother to 3 grandchildren who I saw 6 months ago. She has a history of continued tobacco abuse 5 to 6 cigarettes a day, treated hypertension and dyslipidemia. 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 Her most recent Dopplers are essentially normal. She denies claudication, chest pain or shortness of breath.     Current Outpatient Prescriptions  Medication Sig Dispense Refill  . acetaminophen (TYLENOL) 325 MG tablet Take 325 mg by mouth 2 (two) times daily as needed. For pain      . aspirin (ASPIRIN EC) 81 MG EC tablet Take 81 mg by mouth daily.       . clopidogrel (PLAVIX) 75 MG tablet Take 75 mg by mouth daily.      Marland Kitchen. diltiazem (DILACOR XR) 240 MG 24 hr capsule Take 240 mg by mouth daily.      . Diphenhydramine-APAP, sleep, (TYLENOL PM EXTRA STRENGTH PO) Take by mouth at bedtime as needed.      . hydrALAZINE (APRESOLINE) 10 MG tablet Take 1 tablet by mouth 3 (three) times daily.      Marland Kitchen. lisinopril-hydrochlorothiazide (PRINZIDE,ZESTORETIC) 20-12.5 MG per tablet Take 1 tablet by mouth 2 (two) times daily.      . metoprolol tartrate (LOPRESSOR) 25 MG tablet Take 25 mg by mouth every morning.      . Multiple Vitamin (MULITIVITAMIN WITH MINERALS) TABS Take 1 tablet by mouth daily.      Marland Kitchen. omega-3 acid ethyl esters (LOVAZA) 1 G capsule Take 1 g by mouth 2 (two) times daily.      Marland Kitchen. omeprazole (PRILOSEC) 20 MG capsule Take 20 mg by mouth daily as needed. For GERD      . rosuvastatin (CRESTOR) 5 MG tablet Take 5 mg  by mouth daily.      . vitamin C (ASCORBIC ACID) 500 MG tablet Take 500 mg by mouth daily.       No current facility-administered medications for this visit.    Allergies  Allergen Reactions  . Aspirin Nausea And Vomiting and Other (See Comments)    "pain; hurting"  . Tetanus Toxoids Anaphylaxis and Swelling  . Pravastatin     myalgia    History   Social History  . Marital Status: Married    Spouse Name: N/A    Number of Children: N/A  . Years of Education: N/A   Occupational History  . Not on file.   Social History Main Topics  . Smoking status: Current Some Day Smoker -- 0.50 packs/day for 10 years    Types: Cigarettes  . Smokeless tobacco: Never Used  . Alcohol Use: No  . Drug Use: No  . Sexual Activity: Yes   Other Topics Concern  . Not on file   Social History Narrative  . No narrative on file     Review of Systems: General: negative for chills, fever, night sweats or weight changes.  Cardiovascular: negative for chest pain, dyspnea on exertion, edema, orthopnea, palpitations, paroxysmal nocturnal dyspnea or shortness of breath Dermatological: negative for rash Respiratory: negative for cough or wheezing  Urologic: negative for hematuria Abdominal: negative for nausea, vomiting, diarrhea, bright red blood per rectum, melena, or hematemesis Neurologic: negative for visual changes, syncope, or dizziness All other systems reviewed and are otherwise negative except as noted above.    Blood pressure 138/80, pulse 70, height 5\' 7"  (1.702 m), weight 187 lb 11.2 oz (85.14 kg).  General appearance: alert and no distress Neck: no adenopathy, no carotid bruit, no JVD, supple, symmetrical, trachea midline and thyroid not enlarged, symmetric, no tenderness/mass/nodules Lungs: clear to auscultation bilaterally Heart: regular rate and rhythm, S1, S2 normal, no murmur, click, rub or gallop Extremities: extremities normal, atraumatic, no cyanosis or edema  EKG normal  sinus rhythm at 70 without ST or T wave changes  ASSESSMENT AND PLAN:   Claudication in peripheral vascular disease, lifestyle limiting Status post left SFA directional atherectomy by myself 03/01/12. She had a prior mid left SFA stent with three-vessel runoff. Her most recent lower extremity Dopplers performed 03/31/13 revealed a right ABI of 1.0, left 0.90 with a widely patent left SFA. She denies claudication.  Dyslipidemia On statin therapy followed by her PCP  HTN, LVH, Nl LVF May 2010 Well-controlled on current medications      Runell Gess MD [facility], [facility] 10/03/2013 3:04 PM

## 2013-10-03 NOTE — Patient Instructions (Signed)
  We will see you back in follow up in 1 year with Dr Berry  Dr Berry has ordered lower extremity arterial dopplers   

## 2013-10-03 NOTE — Assessment & Plan Note (Signed)
On statin therapy followed by her PCP 

## 2013-10-16 ENCOUNTER — Ambulatory Visit (HOSPITAL_COMMUNITY)
Admission: RE | Admit: 2013-10-16 | Discharge: 2013-10-16 | Disposition: A | Discharge status: Home / Self Care | Payer: Medicare Other | Source: Ambulatory Visit | Attending: Cardiovascular Disease | Admitting: Cardiovascular Disease

## 2013-10-16 DIAGNOSIS — I739 Peripheral vascular disease, unspecified: Secondary | ICD-10-CM | POA: Insufficient documentation

## 2013-10-16 DIAGNOSIS — I70219 Atherosclerosis of native arteries of extremities with intermittent claudication, unspecified extremity: Secondary | ICD-10-CM

## 2013-10-16 NOTE — Progress Notes (Signed)
Bilateral Lower Ext. Arterial Duplex Completed. Marilynne Halsted, BS, RDMS, RVT

## 2013-10-19 ENCOUNTER — Other Ambulatory Visit: Payer: Self-pay | Admitting: *Deleted

## 2013-10-19 DIAGNOSIS — I739 Peripheral vascular disease, unspecified: Secondary | ICD-10-CM

## 2013-10-23 ENCOUNTER — Telehealth: Payer: Self-pay | Admitting: Cardiovascular Disease

## 2013-10-23 NOTE — Telephone Encounter (Signed)
Pt. Informed of Lower ext. Doppler results

## 2013-10-23 NOTE — Telephone Encounter (Signed)
Pt said somebody called last week,does not know who it was. She said it might have been about her test results.

## 2013-10-23 NOTE — Telephone Encounter (Signed)
Message forwarded to J.C. Wildman, LPN.  

## 2014-06-28 ENCOUNTER — Encounter (HOSPITAL_COMMUNITY): Payer: Self-pay | Admitting: Cardiovascular Disease

## 2014-07-18 ENCOUNTER — Encounter (HOSPITAL_COMMUNITY): Payer: Self-pay | Admitting: Emergency Medicine

## 2014-07-18 ENCOUNTER — Emergency Department (HOSPITAL_COMMUNITY)
Admission: EM | Admit: 2014-07-18 | Discharge: 2014-07-18 | Disposition: A | Discharge status: Home / Self Care | Payer: Medicare Other | Attending: Emergency Medicine | Admitting: Emergency Medicine

## 2014-07-18 ENCOUNTER — Telehealth: Payer: Self-pay | Admitting: Cardiovascular Disease

## 2014-07-18 ENCOUNTER — Emergency Department (HOSPITAL_COMMUNITY): Payer: Medicare Other

## 2014-07-18 ENCOUNTER — Other Ambulatory Visit: Payer: Self-pay | Admitting: Cardiology

## 2014-07-18 DIAGNOSIS — R079 Chest pain, unspecified: Secondary | ICD-10-CM

## 2014-07-18 DIAGNOSIS — K219 Gastro-esophageal reflux disease without esophagitis: Secondary | ICD-10-CM | POA: Insufficient documentation

## 2014-07-18 DIAGNOSIS — E785 Hyperlipidemia, unspecified: Secondary | ICD-10-CM | POA: Diagnosis not present

## 2014-07-18 DIAGNOSIS — Z79899 Other long term (current) drug therapy: Secondary | ICD-10-CM | POA: Diagnosis not present

## 2014-07-18 DIAGNOSIS — I10 Essential (primary) hypertension: Secondary | ICD-10-CM | POA: Diagnosis present

## 2014-07-18 DIAGNOSIS — Z720 Tobacco use: Secondary | ICD-10-CM | POA: Insufficient documentation

## 2014-07-18 DIAGNOSIS — Z87442 Personal history of urinary calculi: Secondary | ICD-10-CM | POA: Diagnosis not present

## 2014-07-18 DIAGNOSIS — R112 Nausea with vomiting, unspecified: Secondary | ICD-10-CM | POA: Insufficient documentation

## 2014-07-18 DIAGNOSIS — Z95820 Peripheral vascular angioplasty status with implants and grafts: Secondary | ICD-10-CM | POA: Insufficient documentation

## 2014-07-18 DIAGNOSIS — Z9089 Acquired absence of other organs: Secondary | ICD-10-CM | POA: Insufficient documentation

## 2014-07-18 DIAGNOSIS — Z862 Personal history of diseases of the blood and blood-forming organs and certain disorders involving the immune mechanism: Secondary | ICD-10-CM | POA: Insufficient documentation

## 2014-07-18 DIAGNOSIS — Z7982 Long term (current) use of aspirin: Secondary | ICD-10-CM | POA: Insufficient documentation

## 2014-07-18 DIAGNOSIS — Z9049 Acquired absence of other specified parts of digestive tract: Secondary | ICD-10-CM | POA: Insufficient documentation

## 2014-07-18 DIAGNOSIS — M1990 Unspecified osteoarthritis, unspecified site: Secondary | ICD-10-CM | POA: Insufficient documentation

## 2014-07-18 DIAGNOSIS — R109 Unspecified abdominal pain: Secondary | ICD-10-CM | POA: Diagnosis present

## 2014-07-18 DIAGNOSIS — N201 Calculus of ureter: Secondary | ICD-10-CM | POA: Diagnosis not present

## 2014-07-18 DIAGNOSIS — IMO0001 Reserved for inherently not codable concepts without codable children: Secondary | ICD-10-CM

## 2014-07-18 DIAGNOSIS — N200 Calculus of kidney: Secondary | ICD-10-CM

## 2014-07-18 DIAGNOSIS — R55 Syncope and collapse: Secondary | ICD-10-CM | POA: Diagnosis not present

## 2014-07-18 DIAGNOSIS — I739 Peripheral vascular disease, unspecified: Secondary | ICD-10-CM | POA: Diagnosis present

## 2014-07-18 DIAGNOSIS — F17200 Nicotine dependence, unspecified, uncomplicated: Secondary | ICD-10-CM | POA: Diagnosis present

## 2014-07-18 LAB — BASIC METABOLIC PANEL
Anion gap: 11 (ref 5–15)
BUN: 28 mg/dL — ABNORMAL HIGH (ref 6–23)
CO2: 25 mmol/L (ref 19–32)
Calcium: 9.6 mg/dL (ref 8.4–10.5)
Chloride: 99 mEq/L (ref 96–112)
Creatinine, Ser: 1.1 mg/dL (ref 0.50–1.10)
GFR, EST AFRICAN AMERICAN: 56 mL/min — AB (ref >90)
GFR, EST NON AFRICAN AMERICAN: 49 mL/min — AB (ref >90)
GLUCOSE: 122 mg/dL — AB (ref 70–99)
POTASSIUM: 3.5 mmol/L (ref 3.5–5.1)
SODIUM: 135 mmol/L (ref 135–145)

## 2014-07-18 LAB — CBC WITH DIFFERENTIAL/PLATELET
Basophils Absolute: 0.1 10*3/uL (ref 0.0–0.1)
Basophils Relative: 0 % (ref 0–1)
Eosinophils Absolute: 0.2 10*3/uL (ref 0.0–0.7)
Eosinophils Relative: 1 % (ref 0–5)
HCT: 40.7 % (ref 36.0–46.0)
Hemoglobin: 13.6 g/dL (ref 12.0–15.0)
LYMPHS ABS: 2.3 10*3/uL (ref 0.7–4.0)
Lymphocytes Relative: 12 % (ref 12–46)
MCH: 27.3 pg (ref 26.0–34.0)
MCHC: 33.4 g/dL (ref 30.0–36.0)
MCV: 81.7 fL (ref 78.0–100.0)
Monocytes Absolute: 0.6 10*3/uL (ref 0.1–1.0)
Monocytes Relative: 3 % (ref 3–12)
NEUTROS ABS: 15.9 10*3/uL — AB (ref 1.7–7.7)
Neutrophils Relative %: 84 % — ABNORMAL HIGH (ref 43–77)
Platelets: 311 10*3/uL (ref 150–400)
RBC: 4.98 MIL/uL (ref 3.87–5.11)
RDW: 14.5 % (ref 11.5–15.5)
WBC: 19.1 10*3/uL — AB (ref 4.0–10.5)

## 2014-07-18 LAB — URINALYSIS, ROUTINE W REFLEX MICROSCOPIC
Bilirubin Urine: NEGATIVE
GLUCOSE, UA: NEGATIVE mg/dL
Ketones, ur: NEGATIVE mg/dL
Leukocytes, UA: NEGATIVE
Nitrite: NEGATIVE
Protein, ur: NEGATIVE mg/dL
SPECIFIC GRAVITY, URINE: 1.017 (ref 1.005–1.030)
Urobilinogen, UA: 0.2 mg/dL (ref 0.0–1.0)
pH: 6 (ref 5.0–8.0)

## 2014-07-18 LAB — I-STAT TROPONIN, ED
TROPONIN I, POC: 0.06 ng/mL (ref 0.00–0.08)
Troponin i, poc: 0 ng/mL (ref 0.00–0.08)

## 2014-07-18 LAB — URINE MICROSCOPIC-ADD ON

## 2014-07-18 MED ORDER — MORPHINE SULFATE 4 MG/ML IJ SOLN
4.0000 mg | Freq: Once | INTRAMUSCULAR | Status: AC
  Administered 2014-07-18: 4 mg via INTRAVENOUS
  Filled 2014-07-18: qty 1

## 2014-07-18 MED ORDER — ONDANSETRON HCL 4 MG PO TABS: 4.0000 mg | ORAL_TABLET | Freq: Four times a day (QID) | ORAL | Status: DC

## 2014-07-18 MED ORDER — CIPROFLOXACIN HCL 500 MG PO TABS: 500.0000 mg | ORAL_TABLET | Freq: Two times a day (BID) | ORAL | Status: DC

## 2014-07-18 MED ORDER — SODIUM CHLORIDE 0.9 % IV BOLUS (SEPSIS)
1000.0000 mL | Freq: Once | INTRAVENOUS | Status: AC
  Administered 2014-07-18: 1000 mL via INTRAVENOUS

## 2014-07-18 MED ORDER — KETOROLAC TROMETHAMINE 30 MG/ML IJ SOLN
30.0000 mg | Freq: Once | INTRAMUSCULAR | Status: AC
  Administered 2014-07-18: 30 mg via INTRAVENOUS
  Filled 2014-07-18: qty 1

## 2014-07-18 MED ORDER — ONDANSETRON HCL 4 MG/2ML IJ SOLN
4.0000 mg | Freq: Once | INTRAMUSCULAR | Status: AC
  Administered 2014-07-18: 4 mg via INTRAVENOUS
  Filled 2014-07-18: qty 2

## 2014-07-18 MED ORDER — HYDROCODONE-ACETAMINOPHEN 5-325 MG PO TABS: 2.0000 | ORAL_TABLET | ORAL | Status: DC | PRN

## 2014-07-18 NOTE — ED Notes (Signed)
Patient here with left flank pain that radiates to left groin which began around 0100. States previous history of kidney stones but on the opposite side. Explains that pain is similar. Additionally feels nauseated and vomited x1 en route.

## 2014-07-18 NOTE — ED Notes (Signed)
This RN alerted by another RN about acute change in patient. Patient currently stating she feels faint, rocking back and forth in bed, and complaining of severe indigestion. HR currently @ 35 and patient is symptomatic, SBP also noted to have decreased from 179 to 100. MD Delo informed of change.

## 2014-07-18 NOTE — Discharge Instructions (Signed)
Kidney Stones Follow-up with the urologist. Dr. Allyson Sabal will call you to schedule a stress test. Return to the ED if he develop new or worsening symptoms. Kidney stones (urolithiasis) are deposits that form inside your kidneys. The intense pain is caused by the stone moving through the urinary tract. When the stone moves, the ureter goes into spasm around the stone. The stone is usually passed in the urine.  CAUSES   A disorder that makes certain neck glands produce too much parathyroid hormone (primary hyperparathyroidism).  A buildup of uric acid crystals, similar to gout in your joints.  Narrowing (stricture) of the ureter.  A kidney obstruction present at birth (congenital obstruction).  Previous surgery on the kidney or ureters.  Numerous kidney infections. SYMPTOMS   Feeling sick to your stomach (nauseous).  Throwing up (vomiting).  Blood in the urine (hematuria).  Pain that usually spreads (radiates) to the groin.  Frequency or urgency of urination. DIAGNOSIS   Taking a history and physical exam.  Blood or urine tests.  CT scan.  Occasionally, an examination of the inside of the urinary bladder (cystoscopy) is performed. TREATMENT   Observation.  Increasing your fluid intake.  Extracorporeal shock wave lithotripsy--This is a noninvasive procedure that uses shock waves to break up kidney stones.  Surgery may be needed if you have severe pain or persistent obstruction. There are various surgical procedures. Most of the procedures are performed with the use of small instruments. Only small incisions are needed to accommodate these instruments, so recovery time is minimized. The size, location, and chemical composition are all important variables that will determine the proper choice of action for you. Talk to your health care provider to better understand your situation so that you will minimize the risk of injury to yourself and your kidney.  HOME CARE INSTRUCTIONS     Drink enough water and fluids to keep your urine clear or pale yellow. This will help you to pass the stone or stone fragments.  Strain all urine through the provided strainer. Keep all particulate matter and stones for your health care provider to see. The stone causing the pain may be as small as a grain of salt. It is very important to use the strainer each and every time you pass your urine. The collection of your stone will allow your health care provider to analyze it and verify that a stone has actually passed. The stone analysis will often identify what you can do to reduce the incidence of recurrences.  Only take over-the-counter or prescription medicines for pain, discomfort, or fever as directed by your health care provider.  Make a follow-up appointment with your health care provider as directed.  Get follow-up X-rays if required. The absence of pain does not always mean that the stone has passed. It may have only stopped moving. If the urine remains completely obstructed, it can cause loss of kidney function or even complete destruction of the kidney. It is your responsibility to make sure X-rays and follow-ups are completed. Ultrasounds of the kidney can show blockages and the status of the kidney. Ultrasounds are not associated with any radiation and can be performed easily in a matter of minutes. SEEK MEDICAL CARE IF:  You experience pain that is progressive and unresponsive to any pain medicine you have been prescribed. SEEK IMMEDIATE MEDICAL CARE IF:   Pain cannot be controlled with the prescribed medicine.  You have a fever or shaking chills.  The severity or intensity of pain increases  over 18 hours and is not relieved by pain medicine.  You develop a new onset of abdominal pain.  You feel faint or pass out.  You are unable to urinate. MAKE SURE YOU:   Understand these instructions.  Will watch your condition.  Will get help right away if you are not doing well  or get worse. Document Released: 07/06/2005 Document Revised: 03/08/2013 Document Reviewed: 12/07/2012 [facility] Patient Information 2015 [employer], [place]. This information is not intended to replace advice given to you by your health care provider. Make sure you discuss any questions you have with your health care provider.

## 2014-07-18 NOTE — ED Notes (Signed)
Patient's HR and BP recovered and she now states that she feels better than before, but "indigestion" remains. MD Delo informed.

## 2014-07-18 NOTE — H&P (Signed)
Patient ID: Christy Campbell MRN: [id number], DOB/AGE: September 09, 1940   Admit date: 07/18/2014   Primary Physician: Abigail MiyamotoPERRY,LAWRENCE EDWARD, MD Primary Cardiologist: Dr Allyson SabalBerry  HPI: The patient is a very pleasant, 73 year old female, followed by Dr Allyson SabalBerry with a history of PVD, continued tobacco abuse 1/2 pk day, treated hypertension and dyslipidemia. She has had multiple procedures on her lower extremities including a right common iliac artery stent, left SFA stent and left SFA directional atherectomy as recently as March 01, 2012. Her most recent Dopplers March 2015 are essentially normal. She has no history of CAD. She had a negative Myoview in 2010. Echo in 2010 showed an EF > 55% with mild pulm HTN and aortic valve sclerosis.           She presented to the ED this am with flank pain. Work up revealed kidney stone. While in the ER she had an episode of "indigestion" followed by a drop in her B/P and HR per ER MD. This resolved spontaneously but did recur. The pt denies chest pain. She has a hx of PUD and GERD symptoms. She denies any radiation to her chest, neck or arms. She denies any SOB. She has not had syncope or pre syncope.    Problem List: Past Medical History  Diagnosis Date  . PVD (peripheral vascular disease) 12/29/2011    h/o right common iliac artery stent, left SFA stent and left SFA directional atherectomy 02/29/12  . Hypertension     echo 12/06/08-EF>55%, mild AS and mild pulmonary htn  . History of stomach ulcers 03/01/12    "little bitty ones"  . Arthritis     "back; right knee and ankle"  . Tobacco abuse   . Dyslipidemia   . H/O cardiovascular stress test 12/06/08    normal, no ischemia  . Kidney stones   . GERD (gastroesophageal reflux disease)   . Anemia     "long time ago"    Past Surgical History  Procedure Laterality Date  . Peripheral arterial stent graft  03/01/2012    successful directional atheretomy using turbo hawk of a focal prox left SFA lesion   .  Atherectomy  03/01/2012  . Inguinal hernia repair  1990's    right  . Knee arthroscopy  1990's    bilaterally  . Vaginal hysterectomy  1970's  . Tubal ligation  1960's  . Pv angiogram  12/29/11    successful directinal atherectomy using the turbo hawk to righ SFA stenosis  . Pv angiogram  04/17/10    successful PTA/stenting of the left superficial femoral artery and right iliac  . Colonoscopy    . Eye surgery      Vitrectomy, and bilateral cataracts  . Appendectomy    . Cholecystectomy    . Pars plana vitrectomy Right 07/27/2013    Procedure: PARS PLANA VITRECTOMY WITH 25 GAUGE;  Surgeon: Edmon CrapeGary A Rankin, MD;  [facility];  Service: Ophthalmology;  Laterality: Right;  . Laser photo ablation Right 07/27/2013    Procedure: LASER PHOTO ABLATION;  Surgeon: Edmon CrapeGary A Rankin, MD;  [facility];  Service: Ophthalmology;  Laterality: Right;  . Atherectomy N/A 12/29/2011    Procedure: ATHERECTOMY;  Surgeon: Runell GessJonathan J Berry, MD;  [facility];  Service: Cardiovascular;  Laterality: N/A;  . Atherectomy N/A 03/01/2012    Procedure: ATHERECTOMY;  Surgeon: Runell GessJonathan J Berry, MD;  [facility];  Service: Cardiovascular;  Laterality: N/A;     Allergies:  Allergies  Allergen Reactions  . Aspirin Nausea And Vomiting and Other (See Comments)    "pain; hurting"  . Tetanus Toxoids Anaphylaxis and Swelling  . Pravastatin     myalgia     Home Medications No current facility-administered medications for this encounter.   Current Outpatient Prescriptions  Medication Sig Dispense Refill  . acetaminophen (TYLENOL) 325 MG tablet Take 325 mg by mouth 2 (two) times daily as needed. For pain    . aspirin (ASPIRIN EC) 81 MG EC tablet Take 81 mg by mouth daily.     . clopidogrel (PLAVIX) 75 MG tablet Take 75 mg by mouth daily.    Marland Kitchen diltiazem (DILACOR XR) 240 MG 24 hr capsule Take 240 mg by mouth daily.    . Diphenhydramine-APAP, sleep, (TYLENOL PM EXTRA STRENGTH PO) Take 1 tablet by mouth at  bedtime as needed (sleep).     . hydrALAZINE (APRESOLINE) 10 MG tablet Take 1 tablet by mouth 3 (three) times daily.    Marland Kitchen lisinopril-hydrochlorothiazide (PRINZIDE,ZESTORETIC) 20-12.5 MG per tablet Take 1 tablet by mouth 2 (two) times daily.    . metoprolol tartrate (LOPRESSOR) 25 MG tablet Take 25 mg by mouth every morning.    . Multiple Vitamin (MULITIVITAMIN WITH MINERALS) TABS Take 1 tablet by mouth daily.    Marland Kitchen omega-3 acid ethyl esters (LOVAZA) 1 G capsule Take 1 g by mouth 2 (two) times daily.    Marland Kitchen omeprazole (PRILOSEC) 20 MG capsule Take 20 mg by mouth daily as needed. For GERD    . rosuvastatin (CRESTOR) 5 MG tablet Take 5 mg by mouth daily.    . vitamin C (ASCORBIC ACID) 500 MG tablet Take 500 mg by mouth daily.       No family history on file.   History   Social History  . Marital Status: Married    Spouse Name: N/A    Number of Children: N/A  . Years of Education: N/A   Occupational History  . Not on file.   Social History Main Topics  . Smoking status: Current Some Day Smoker -- 0.50 packs/day for 10 years    Types: Cigarettes  . Smokeless tobacco: Never Used  . Alcohol Use: No  . Drug Use: No  . Sexual Activity: Yes   Other Topics Concern  . Not on file   Social History Narrative     Review of Systems: General: negative for chills, fever, night sweats or weight changes.  Cardiovascular: negative for chest pain, dyspnea on exertion, edema, orthopnea, palpitations, paroxysmal nocturnal dyspnea or shortness of breath Dermatological: negative for rash Respiratory: negative for cough or wheezing Urologic: negative for hematuria Abdominal: negative for nausea, vomiting, diarrhea, bright red blood per rectum, melena, or hematemesis Neurologic: negative for visual changes, syncope, or dizziness All other systems reviewed and are otherwise negative except as noted above.  Physical Exam: Blood pressure 170/73, pulse 66, temperature 97.9 F (36.6 C), temperature  source Oral, resp. rate 10, height 5\' 7"  (1.702 m), weight 179 lb (81.194 kg), SpO2 99 %.  General appearance: alert, cooperative and no distress Neck: no JVD and Rt carotid bruit Lungs: decreased breath sounds but clear Heart: regular rate and rhythm Abdomen: soft, non-tender; bowel sounds normal; no masses,  no organomegaly Extremities: no edema Pulses: diminnished LE pulses Skin: cool and dry Neurologic: Grossly normal    Labs:   Results for orders placed or performed during the hospital encounter of 07/18/14 (from the past 24 hour(s))  Basic metabolic panel  Status: Abnormal   Collection Time: 07/18/14  3:53 AM  Result Value Ref Range   Sodium 135 135 - 145 mmol/L   Potassium 3.5 3.5 - 5.1 mmol/L   Chloride 99 96 - 112 mEq/L   CO2 25 19 - 32 mmol/L   Glucose, Bld 122 (H) 70 - 99 mg/dL   BUN 28 (H) 6 - 23 mg/dL   Creatinine, Ser 1.61 0.50 - 1.10 mg/dL   Calcium 9.6 8.4 - 09.6 mg/dL   GFR calc non Af Amer 49 (L) >90 mL/min   GFR calc Af Amer 56 (L) >90 mL/min   Anion gap 11 5 - 15  CBC with Differential     Status: Abnormal   Collection Time: 07/18/14  3:53 AM  Result Value Ref Range   WBC 19.1 (H) 4.0 - 10.5 K/uL   RBC 4.98 3.87 - 5.11 MIL/uL   Hemoglobin 13.6 12.0 - 15.0 g/dL   HCT 04.5 40.9 - 81.1 %   MCV 81.7 78.0 - 100.0 fL   MCH 27.3 26.0 - 34.0 pg   MCHC 33.4 30.0 - 36.0 g/dL   RDW 91.4 78.2 - 95.6 %   Platelets 311 150 - 400 K/uL   Neutrophils Relative % 84 (H) 43 - 77 %   Neutro Abs 15.9 (H) 1.7 - 7.7 K/uL   Lymphocytes Relative 12 12 - 46 %   Lymphs Abs 2.3 0.7 - 4.0 K/uL   Monocytes Relative 3 3 - 12 %   Monocytes Absolute 0.6 0.1 - 1.0 K/uL   Eosinophils Relative 1 0 - 5 %   Eosinophils Absolute 0.2 0.0 - 0.7 K/uL   Basophils Relative 0 0 - 1 %   Basophils Absolute 0.1 0.0 - 0.1 K/uL  Urinalysis, Routine w reflex microscopic     Status: Abnormal   Collection Time: 07/18/14  4:37 AM  Result Value Ref Range   Color, Urine YELLOW YELLOW    APPearance CLEAR CLEAR   Specific Gravity, Urine 1.017 1.005 - 1.030   pH 6.0 5.0 - 8.0   Glucose, UA NEGATIVE NEGATIVE mg/dL   Hgb urine dipstick MODERATE (A) NEGATIVE   Bilirubin Urine NEGATIVE NEGATIVE   Ketones, ur NEGATIVE NEGATIVE mg/dL   Protein, ur NEGATIVE NEGATIVE mg/dL   Urobilinogen, UA 0.2 0.0 - 1.0 mg/dL   Nitrite NEGATIVE NEGATIVE   Leukocytes, UA NEGATIVE NEGATIVE  Urine microscopic-add on     Status: Abnormal   Collection Time: 07/18/14  4:37 AM  Result Value Ref Range   Squamous Epithelial / LPF FEW (A) RARE   WBC, UA 3-6 <3 WBC/hpf   RBC / HPF 3-6 <3 RBC/hpf   Bacteria, UA FEW (A) RARE  I-stat troponin, ED     Status: None   Collection Time: 07/18/14  5:36 AM  Result Value Ref Range   Troponin i, poc 0.00 0.00 - 0.08 ng/mL   Comment 3             Radiology/Studies: Ct Renal Stone Study  07/18/2014   CLINICAL DATA:  LEFT flank and groin pain. History kidney stones. History LEFT renal complex cyst.  EXAM: CT ABDOMEN AND PELVIS WITHOUT CONTRAST   IMPRESSION: Moderate LEFT hydroureteronephrosis to the low level of the ureterovesicular junction where a 3 mm calculus is seen. Perinephric free fluid suggests caliceal rupture. No residual nephrolithiasis.  Enlarging LEFT upper pole cyst previously described as complex, for which follow-up MRI renal mass protocol is recommended.   Electronically Signed  By: Awilda Metro   On: 07/18/2014 04:40    EKG:NSR, SB (48)  ASSESSMENT AND PLAN:  Principal Problem:   Transient hypotension and bradycardia in setting of "indigestion" Active Problems:   Nephrolithiasis   PVD, Rt SFA PTA 12/29/11,  Lt. PTA SFA 03/01/12    HTN, LVH, Nl LVF May 2010   Dyslipidemia   Normal Myoview, May 2010   Smoker   PLAN: Will review with MD. If second Troponin negative consider OP Myoview and f/u with Dr Allyson Sabal.   Deland Pretty, PA-C 07/18/2014, 8:09 AM   The patient was seen in the emergency room.  She is presently resting  comfortably.  She has had symptoms consistent with painful renal colic this has been associated with epigastric discomfort and episodes of transient bradycardia secondary to probable vasovagal syndrome from intense pain.  Currently her exam is unremarkable.  She is in normal sinus rhythm.  Agree with above plan.  Doubt acute coronary syndrome.  If her second troponin is negative she can be discharged from the emergency room and we will set up an outpatient Myoview and follow-up with Dr. Allyson Sabal

## 2014-07-18 NOTE — ED Notes (Signed)
HR once again dropped, 43 @ this time. SBP Currently 93. MD Delo informed, and at bedside.

## 2014-07-18 NOTE — ED Provider Notes (Signed)
CSN: [id number]     Arrival date & time 07/18/14  4098 History  This chart was scribe for Christy Lyons, MD by Angelene Giovanni, ED Scribe. The patient was seen in room D35C/D35C and the patient's care was started at 3:47 AM.    Chief Complaint  Patient presents with  . Flank Pain   The history is provided by the patient. No language interpreter was used.   HPI Comments: Christy Campbell is a 73 y.o. female with a hx of kidney stones 25 years ago who presents to the Emergency Department complaining of left flank pain that radiates to the front onset 1 am today. She reports associated nausea and vomiting. She denies any dysuria. She reports that these symptoms are similar to the last time she had kidney stones.  Past Medical History  Diagnosis Date  . PVD (peripheral vascular disease) 12/29/2011    h/o right common iliac artery stent, left SFA stent and left SFA directional atherectomy 02/29/12  . Abdominal pain, post PV angio, Possibly secondary to ASA on empty stomach 12/29/2011  . Claudication in peripheral vascular disease 12/29/2011  . Hypertension     echo 12/06/08-EF>55%, mild AS and mild pulmonary htn  . History of stomach ulcers 03/01/12    "little bitty ones"  . Arthritis     "back; right knee and ankle"  . Tobacco abuse   . Dyslipidemia   . H/O cardiovascular stress test 12/06/08    normal, no ischemia  . Kidney stones   . GERD (gastroesophageal reflux disease)   . Anemia     "long time ago"   Past Surgical History  Procedure Laterality Date  . Peripheral arterial stent graft  03/01/2012    successful directional atheretomy using turbo hawk of a focal prox left SFA lesion   . Atherectomy  03/01/2012  . Inguinal hernia repair  1990's    right  . Knee arthroscopy  1990's    bilaterally  . Vaginal hysterectomy  1970's  . Tubal ligation  1960's  . Pv angiogram  12/29/11    successful directinal atherectomy using the turbo hawk to righ SFA stenosis  . Pv angiogram  04/17/10     successful PTA/stenting of the left superficial femoral artery and right iliac  . Colonoscopy    . Eye surgery      Vitrectomy, and bilateral cataracts  . Appendectomy    . Cholecystectomy    . Pars plana vitrectomy Right 07/27/2013    Procedure: PARS PLANA VITRECTOMY WITH 25 GAUGE;  Surgeon: Edmon Crape, MD;  Location: [facility] OR;  Service: Ophthalmology;  Laterality: Right;  . Laser photo ablation Right 07/27/2013    Procedure: LASER PHOTO ABLATION;  Surgeon: Edmon Crape, MD;  Location: [facility] OR;  Service: Ophthalmology;  Laterality: Right;  . Atherectomy N/A 12/29/2011    Procedure: ATHERECTOMY;  Surgeon: Runell Gess, MD;  Location: [facility] CATH LAB;  Service: Cardiovascular;  Laterality: N/A;  . Atherectomy N/A 03/01/2012    Procedure: ATHERECTOMY;  Surgeon: Runell Gess, MD;  Location: [facility] CATH LAB;  Service: Cardiovascular;  Laterality: N/A;   No family history on file. History  Substance Use Topics  . Smoking status: Current Some Day Smoker -- 0.50 packs/day for 10 years    Types: Cigarettes  . Smokeless tobacco: Never Used  . Alcohol Use: No   OB History    No data available     Review of Systems  Gastrointestinal: Positive for nausea and vomiting.  Genitourinary: Positive for flank pain. Negative for dysuria.  All other systems reviewed and are negative.  A complete 10 system review of systems was obtained and all systems are negative except as noted in the HPI and PMH.     Allergies  Aspirin; Tetanus toxoids; and Pravastatin  Home Medications   Prior to Admission medications   Medication Sig Start Date End Date Taking? Authorizing Provider  acetaminophen (TYLENOL) 325 MG tablet Take 325 mg by mouth 2 (two) times daily as needed. For pain    Historical Provider, MD  aspirin (ASPIRIN EC) 81 MG EC tablet Take 81 mg by mouth daily.     Historical Provider, MD  clopidogrel (PLAVIX) 75 MG tablet Take 75 mg by mouth daily.    Historical Provider, MD  diltiazem (DILACOR  XR) 240 MG 24 hr capsule Take 240 mg by mouth daily.    Historical Provider, MD  Diphenhydramine-APAP, sleep, (TYLENOL PM EXTRA STRENGTH PO) Take by mouth at bedtime as needed.    Historical Provider, MD  hydrALAZINE (APRESOLINE) 10 MG tablet Take 1 tablet by mouth 3 (three) times daily. 09/22/13   Historical Provider, MD  lisinopril-hydrochlorothiazide (PRINZIDE,ZESTORETIC) 20-12.5 MG per tablet Take 1 tablet by mouth 2 (two) times daily.    Historical Provider, MD  metoprolol tartrate (LOPRESSOR) 25 MG tablet Take 25 mg by mouth every morning.    Historical Provider, MD  Multiple Vitamin (MULITIVITAMIN WITH MINERALS) TABS Take 1 tablet by mouth daily.    Historical Provider, MD  omega-3 acid ethyl esters (LOVAZA) 1 G capsule Take 1 g by mouth 2 (two) times daily.    Historical Provider, MD  omeprazole (PRILOSEC) 20 MG capsule Take 20 mg by mouth daily as needed. For GERD    Historical Provider, MD  rosuvastatin (CRESTOR) 5 MG tablet Take 5 mg by mouth daily.    Historical Provider, MD  vitamin C (ASCORBIC ACID) 500 MG tablet Take 500 mg by mouth daily.    Historical Provider, MD   BP 175/90 mmHg  Pulse 73  Temp(Src) 97.9 F (36.6 C) (Oral)  Resp 17  Ht 5\' 7"  (1.702 m)  Wt 179 lb (81.194 kg)  BMI 28.03 kg/m2  SpO2 99% Physical Exam  Constitutional: She is oriented to person, place, and time. She appears well-developed and well-nourished. No distress.  HENT:  Head: Normocephalic and atraumatic.  Eyes: Conjunctivae and EOM are normal.  Neck: Neck supple. No tracheal deviation present.  Cardiovascular: Normal rate and regular rhythm.   Pulmonary/Chest: Effort normal and breath sounds normal. No respiratory distress.  Abdominal: There is tenderness.  Tenderness in LLQ and left flank.   Musculoskeletal: Normal range of motion.  Neurological: She is alert and oriented to person, place, and time.  Skin: Skin is warm and dry.  Psychiatric: She has a normal mood and affect. Her behavior is  normal.  Nursing note and vitals reviewed.   ED Course  Procedures (including critical care time) DIAGNOSTIC STUDIES: Oxygen Saturation is 99% on RA, normal by my interpretation.    COORDINATION OF CARE: 3:50 AM- Pt advised of plan for treatment and pt agrees.    Labs Review Labs Reviewed - No data to display  Imaging Review No results found.   Date: 07/18/2014  Rate: 48  Rhythm: sinus bradycardia  QRS Axis: left  Intervals: normal  ST/T Wave abnormalities: normal  Conduction Disutrbances:none  Narrative Interpretation:   Old EKG Reviewed: changes noted    MDM   Final diagnoses:  None    Patient presents here initially with complains of flank pain and was found to have a renal calculus. She was feeling better and was seemingly appropriate for discharge. While she was waiting for her discharge papers, she began to experience which she described as "heartburn". This was associated with a bradycardia with her heart rate dipping into the low 40s and hypotension with her blood pressure dropping into the 90s systolic. This lasted for several minutes, then resolve spontaneously. She was observed for a period of time and she experienced a second similar episode.  I've discussed the case with cardiology who will evaluate the patient in the ER to determine the final disposition. At this point, her troponin is negative and EKG does not show any acute changes.  I personally performed the services described in this documentation, which was scribed in my presence. The recorded information has been reviewed and is accurate.    Christy Lyons, MD 07/19/14 [phone]

## 2014-07-18 NOTE — Telephone Encounter (Signed)
Closed encounter °

## 2014-07-18 NOTE — ED Provider Notes (Signed)
Assumed care from Dr. Judd Lien at 7 AM. Awaiting cardiology consult.  Troponin negative 2. Indigestion has resolved. Cardiology has seen patient and scheduled her for outpatient stress test.  Urinalysis does not appear infected but has leukocytosis of 19. Patient is afebrile. Discussed with urology Dr. Sherron Monday who recommends urine culture in 5 days of antibiotics.  Patient's pain has resolved. She'll be treated with symptomatically care, antibiotics, follow-up with urology and cardiology.    EKG Interpretation  Date/Time:  Wednesday July 18 2014 09:59:21 EST Ventricular Rate:  72 PR Interval:  176 QRS Duration: 84 QT Interval:  401 QTC Calculation: 439 R Axis:   -7 Text Interpretation:  Sinus rhythm Anteroseptal infarct, old No significant change was found Confirmed by Mckynlee Luse  MD, Jamiya Nims [id number]) on 07/18/2014 10:07:25 AM        BP 153/66 mmHg  Pulse 70  Temp(Src) 97.8 F (36.6 C) (Rectal)  Resp 20  Ht 5\' 7"  (1.702 m)  Wt 179 lb (81.194 kg)  BMI 28.03 kg/m2  SpO2 95%   Glynn Octave, MD 07/18/14 1016

## 2014-07-18 NOTE — ED Notes (Signed)
EKG Completed per MD Delo.

## 2014-07-22 LAB — URINE CULTURE

## 2014-07-23 ENCOUNTER — Telehealth (HOSPITAL_BASED_OUTPATIENT_CLINIC_OR_DEPARTMENT_OTHER): Payer: Self-pay | Admitting: Emergency Medicine

## 2014-07-23 NOTE — Telephone Encounter (Signed)
Post ED Visit - Positive Culture Follow-up  Culture report reviewed by antimicrobial stewardship pharmacist: []  Wes Dulaney, Pharm.D., BCPS [x]  Celedonio Miyamoto, Pharm.D., BCPS []  Georgina Pillion, [address].D., BCPS []  [place], [address].D., BCPS, AAHIVP []  Estella Husk, Pharm.D., BCPS, AAHIVP []  Elder Cyphers, [address].D., BCPS  Positive urine culture E. Coli Treated with ciprofloxacin, organism sensitive to the same and no further patient follow-up is required at this time.  Berle Mull 07/23/2014, 2:08 PM

## 2014-07-26 ENCOUNTER — Encounter (HOSPITAL_COMMUNITY): Payer: Self-pay | Admitting: *Deleted

## 2014-08-09 ENCOUNTER — Telehealth (HOSPITAL_COMMUNITY): Payer: Self-pay

## 2014-08-09 NOTE — Telephone Encounter (Signed)
Encounter complete. 

## 2014-08-14 ENCOUNTER — Ambulatory Visit (HOSPITAL_COMMUNITY)
Admission: RE | Admit: 2014-08-14 | Discharge: 2014-08-14 | Disposition: A | Discharge status: Home / Self Care | Payer: Medicare HMO | Source: Ambulatory Visit | Attending: Cardiology | Admitting: Cardiology

## 2014-08-14 DIAGNOSIS — I739 Peripheral vascular disease, unspecified: Secondary | ICD-10-CM | POA: Insufficient documentation

## 2014-08-14 DIAGNOSIS — Z720 Tobacco use: Secondary | ICD-10-CM | POA: Diagnosis not present

## 2014-08-14 DIAGNOSIS — E785 Hyperlipidemia, unspecified: Secondary | ICD-10-CM | POA: Insufficient documentation

## 2014-08-14 DIAGNOSIS — R55 Syncope and collapse: Secondary | ICD-10-CM

## 2014-08-14 DIAGNOSIS — R079 Chest pain, unspecified: Secondary | ICD-10-CM

## 2014-08-14 MED ORDER — TECHNETIUM TC 99M SESTAMIBI GENERIC - CARDIOLITE
10.6000 | Freq: Once | INTRAVENOUS | Status: AC | PRN
  Administered 2014-08-14: 11 via INTRAVENOUS

## 2014-08-14 MED ORDER — TECHNETIUM TC 99M SESTAMIBI GENERIC - CARDIOLITE
30.8000 | Freq: Once | INTRAVENOUS | Status: AC | PRN
  Administered 2014-08-14: 30.8 via INTRAVENOUS

## 2014-08-14 MED ORDER — REGADENOSON 0.4 MG/5ML IV SOLN
0.4000 mg | Freq: Once | INTRAVENOUS | Status: AC
  Administered 2014-08-14: 0.4 mg via INTRAVENOUS

## 2014-08-14 NOTE — Procedures (Addendum)
[facility] [address] [place] [address] [phone]  Cardiology Nuclear Med Study  Christy Campbell is a 74 y.o. female     MRN : [id number]     DOB: April 15, 1941  Procedure Date: 08/14/2014  Nuclear Med Background Indication for Stress Test:  Evaluation for Ischemia, Post Hospital and 07/18/2014 History:  No priro cardiac history reported; No prior respiratory history reported;Last NUC MPI on 12/06/2008-normal;EF=62% Cardiac Risk Factors: Lipids, PVD, Smoker and stents to Right common iliac artery and Left SFA  Symptoms:  Pt denies symptoms at this time.   Nuclear Pre-Procedure Caffeine/Decaff Intake:  1:00am NPO After: 11am   IV Site: R Forearm  IV 0.9% NS with Angio Cath:  22g  Chest Size (in):  n/a IV Started by: Berdie Ogren, RN  Height: 5\' 7"  (1.702 m)  Cup Size: B  BMI:  Body mass index is 28.03 kg/(m^2). Weight:  179 lb (81.194 kg)   Tech Comments:  n/a    Nuclear Med Study 1 or 2 day study: 1 day  Stress Test Type:  Lexiscan  Order Authorizing Provider:  Nanetta Batty, MD   Resting Radionuclide: Technetium 36m Sestamibi  Resting Radionuclide Dose: 10.6 mCi   Stress Radionuclide:  Technetium 63m Sestamibi  Stress Radionuclide Dose: 30.8 mCi           Stress Protocol Rest HR: 60 Stress HR: 71  Rest BP:158/83 Stress BP: 183/76  Exercise Time (min): n/a METS: n/a          Dose of Adenosine (mg):  n/a Dose of Lexiscan: 0.4 mg  Dose of Atropine (mg): n/a Dose of Dobutamine: n/a mcg/kg/min (at max HR)  Stress Test Technologist: Ernestene Mention, CCT Nuclear Technologist: Gonzella Lex, CNMT   Rest Procedure:  Myocardial perfusion imaging was performed at rest 45 minutes following the intravenous administration of Technetium 108m Sestamibi. Stress Procedure:  The patient received IV Lexiscan 0.4 mg over 15-seconds.  Technetium 45m Sestamibi injected IV at 30-seconds.  There were no significant changes  with Lexiscan.  Quantitative spect images were obtained after a 45 minute delay.  Transient Ischemic Dilatation (Normal <1.22):  1.15  QGS EDV:  117 ml QGS ESV:  58 ml LV Ejection Fraction: 51%  Rest ECG: NSR with inferior and lateral TWI's  Stress ECG: No significant change from baseline ECG  QPS Raw Data Images:  Normal; no motion artifact; normal heart/lung ratio. Stress Images:  apical septal perfusion defect Rest Images:  Normal homogeneous uptake in all areas of the myocardium. Subtraction (SDS):  4  Impression Exercise Capacity:  Lexiscan with no exercise. BP Response:  Hypertensive blood pressure response. Clinical Symptoms:  There is dyspnea. ECG Impression:  No significant ECG changes with Lexiscan. Comparison with Prior Nuclear Study: Prior nuclear study in 2010 was normal  Overall Impression:  Intermediate risk stress nuclear study with a small to medium-sized, mild intensity, reversible (SDS 4) distal anteroseptal perfusion defect, suggestive of ischemia. Clinical correlation is advised.  LV Wall Motion:  NL LV Function; NL Wall Motion; LVEF 51%.  Chrystie Nose, MD, [facility] Board Certified in Nuclear Cardiology Attending Cardiologist [facility]  Chrystie Nose, MD  08/14/2014 5:07 PM

## 2014-08-20 ENCOUNTER — Encounter: Payer: Self-pay | Admitting: Cardiovascular Disease

## 2014-08-20 ENCOUNTER — Ambulatory Visit (INDEPENDENT_AMBULATORY_CARE_PROVIDER_SITE_OTHER): Payer: Medicare HMO | Admitting: Cardiovascular Disease

## 2014-08-20 VITALS — BP 142/80 | HR 72 | Ht 67.0 in | Wt 183.1 lb

## 2014-08-20 DIAGNOSIS — I739 Peripheral vascular disease, unspecified: Secondary | ICD-10-CM

## 2014-08-20 DIAGNOSIS — Z720 Tobacco use: Secondary | ICD-10-CM

## 2014-08-20 DIAGNOSIS — F17200 Nicotine dependence, unspecified, uncomplicated: Secondary | ICD-10-CM

## 2014-08-20 DIAGNOSIS — E785 Hyperlipidemia, unspecified: Secondary | ICD-10-CM

## 2014-08-20 DIAGNOSIS — I10 Essential (primary) hypertension: Secondary | ICD-10-CM

## 2014-08-20 NOTE — Assessment & Plan Note (Signed)
History of hyperlipidemia on Crestor followed by her PCP 

## 2014-08-20 NOTE — Assessment & Plan Note (Signed)
History of peripheral arterial disease status post right common iliac as well as left and right SFA intervention August 2013. She currently denies claudication. Her most recent Dopplers performed 10/16/13 revealed ABIs of approximately 1 bilaterally with a moderately high frequency signal in the left external iliac artery. Repeat Dopplers are scheduled for this coming March.

## 2014-08-20 NOTE — Assessment & Plan Note (Signed)
Continues to smoke 5 cigarettes a day

## 2014-08-20 NOTE — Assessment & Plan Note (Signed)
History of hypertension with blood pressure measured at 142/80. She is on lisinopril hydrochlorothiazide and metoprolol. Continue current meds at current dosing

## 2014-08-20 NOTE — Progress Notes (Signed)
08/20/2014 Christy Campbell   11/02/1940  [id number]  Primary Physician PERRY,LAWRENCE Ramon Dredge, MD Primary Cardiologist: Runell Gess MD Roseanne Reno   HPI:  The patient is a very pleasant, 74 year old, mild to moderately overweight, married Caucasian female, mother of 2, grandmother to 3 grandchildren who I saw 10 months ago. She has a history of continued tobacco abuse 5 to 6 cigarettes a day, treated hypertension and dyslipidemia. 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 Her most recent Dopplers performed 10/16/13 revealed ABIs of close to 1 bilaterally with a moderately-sized signal in the left external iliac artery. She denies claudication. She was hospitalized for nephrolithiasis at the end of last year and had an episode of hypotension and chest pain. As result of this a Myoview stress test performed in our office 08/14/14 revealed mild anteroapical ischemia. She has no symptoms. I think this is low risk.   Current Outpatient Prescriptions  Medication Sig Dispense Refill  . acetaminophen (TYLENOL) 325 MG tablet Take 325 mg by mouth 2 (two) times daily as needed. For pain    . aspirin (ASPIRIN EC) 81 MG EC tablet Take 81 mg by mouth daily.     . clopidogrel (PLAVIX) 75 MG tablet Take 75 mg by mouth daily.    Marland Kitchen diltiazem (DILACOR XR) 240 MG 24 hr capsule Take 240 mg by mouth daily.    . Diphenhydramine-APAP, sleep, (TYLENOL PM EXTRA STRENGTH PO) Take 1 tablet by mouth at bedtime as needed (sleep).     . hydrALAZINE (APRESOLINE) 10 MG tablet Take 1 tablet by mouth 3 (three) times daily.    Marland Kitchen HYDROcodone-acetaminophen (NORCO/VICODIN) 5-325 MG per tablet Take 2 tablets by mouth every 4 (four) hours as needed. 10 tablet 0  . lisinopril-hydrochlorothiazide (PRINZIDE,ZESTORETIC) 20-12.5  MG per tablet Take 1 tablet by mouth 2 (two) times daily.    . metoprolol tartrate (LOPRESSOR) 25 MG tablet Take 25 mg by mouth every morning.    . Multiple Vitamin (MULITIVITAMIN WITH MINERALS) TABS Take 1 tablet by mouth daily.    Marland Kitchen omega-3 acid ethyl esters (LOVAZA) 1 G capsule Take 1 g by mouth 2 (two) times daily.    Marland Kitchen omeprazole (PRILOSEC) 20 MG capsule Take 20 mg by mouth daily as needed. For GERD    . ondansetron (ZOFRAN) 4 MG tablet Take 1 tablet (4 mg total) by mouth every 6 (six) hours. 12 tablet 0  . rosuvastatin (CRESTOR) 5 MG tablet Take 5 mg by mouth daily.    . vitamin C (ASCORBIC ACID) 500 MG tablet Take 500 mg by mouth daily.     No current facility-administered medications for this visit.    Allergies  Allergen Reactions  . Aspirin Nausea And Vomiting and Other (See Comments)    "pain; hurting"  . Tetanus Toxoids Anaphylaxis and Swelling  . Pravastatin     myalgia    History   Social History  . Marital Status: Married    Spouse Name: N/A    Number of Children: N/A  . Years of Education: N/A   Occupational History  . Not on file.   Social History Main Topics  . Smoking status: Current Some Day Smoker -- 0.50 packs/day for 10 years    Types: Cigarettes  . Smokeless tobacco: Never Used  . Alcohol Use: No  .  Drug Use: No  . Sexual Activity: Yes   Other Topics Concern  . Not on file   Social History Narrative     Review of Systems: General: negative for chills, fever, night sweats or weight changes.  Cardiovascular: negative for chest pain, dyspnea on exertion, edema, orthopnea, palpitations, paroxysmal nocturnal dyspnea or shortness of breath Dermatological: negative for rash Respiratory: negative for cough or wheezing Urologic: negative for hematuria Abdominal: negative for nausea, vomiting, diarrhea, bright red blood per rectum, melena, or hematemesis Neurologic: negative for visual changes, syncope, or dizziness All other systems reviewed and  are otherwise negative except as noted above.    Blood pressure 142/80, pulse 72, height  (1.702 m), weight 183 lb 1.6 oz (83.054 kg).  General appearance: alert and no distress Neck: no adenopathy, no carotid bruit, no JVD, supple, symmetrical, trachea midline and thyroid not enlarged, symmetric, no tenderness/mass/nodules Lungs: clear to auscultation bilaterally Heart: regular rate and rhythm, S1, S2 normal, no murmur, click, rub or gallop Extremities: extremities normal, atraumatic, no cyanosis or edema  EKG not performed.  ASSESSMENT AND PLAN:   Smoker Continues to smoke 5 cigarettes a day   PVD, Rt SFA PTA 12/29/11,  Lt. PTA SFA 03/01/12  History of peripheral arterial disease status post right common iliac as well as left and right SFA intervention August 2013. She currently denies claudication. Her most recent Dopplers performed 10/16/13 revealed ABIs of approximately 1 bilaterally with a moderately high frequency signal in the left external iliac artery. Repeat Dopplers are scheduled for this coming March.   HTN, LVH, Nl LVF May 2010 History of hypertension with blood pressure measured at 142/80. She is on lisinopril hydrochlorothiazide and metoprolol. Continue current meds at current dosing   Dyslipidemia History of hyperlipidemia on Crestor followed by her PCP       Runell Gess MD [facility], [facility] 08/20/2014 3:32 PM

## 2014-08-20 NOTE — Patient Instructions (Signed)
Your physician wants you to follow-up in 1 year with Dr. Berry. You will receive a reminder letter in the mail 2 months in advance. If you do not receive a letter, please call our office to schedule the follow-up appointment.  

## 2014-08-28 ENCOUNTER — Ambulatory Visit: Payer: Medicare Other | Admitting: Cardiovascular Disease

## 2015-08-23 ENCOUNTER — Ambulatory Visit (INDEPENDENT_AMBULATORY_CARE_PROVIDER_SITE_OTHER): Payer: Medicare HMO | Admitting: Cardiovascular Disease

## 2015-08-23 ENCOUNTER — Encounter: Payer: Self-pay | Admitting: Cardiovascular Disease

## 2015-08-23 VITALS — BP 152/72 | HR 64 | Ht 66.0 in | Wt 185.6 lb

## 2015-08-23 DIAGNOSIS — E785 Hyperlipidemia, unspecified: Secondary | ICD-10-CM | POA: Diagnosis not present

## 2015-08-23 DIAGNOSIS — I739 Peripheral vascular disease, unspecified: Secondary | ICD-10-CM

## 2015-08-23 DIAGNOSIS — F17200 Nicotine dependence, unspecified, uncomplicated: Secondary | ICD-10-CM

## 2015-08-23 DIAGNOSIS — I10 Essential (primary) hypertension: Secondary | ICD-10-CM

## 2015-08-23 DIAGNOSIS — Z720 Tobacco use: Secondary | ICD-10-CM

## 2015-08-23 NOTE — Assessment & Plan Note (Signed)
History of hypertension with blood pressure measured today at 170/96. She says this is "white coat hypertension. She is on diltiazem, hydralazine, lisinopril, hydrochlorothiazide and metoprolol. Continue current meds at current dosing

## 2015-08-23 NOTE — Progress Notes (Signed)
08/23/2015 Christy Campbell   05-Jan-1941  [id number]  Primary Physician Abigail Miyamoto, MD Primary Cardiologist: Runell Gess MD Roseanne Reno   HPI:  The patient is a very pleasant, 75 year old, mild to moderately overweight, married Caucasian female, mother of 2, grandmother to 3 grandchildren who I saw 08/20/14. She has a history of continued tobacco abuse 5 to 6 cigarettes a day, treated hypertension and dyslipidemia. 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 Her most recent Dopplers performed 10/16/13 revealed ABIs of close to 1 bilaterally with a moderately-sized signal in the left external iliac artery. She denies claudication. She was hospitalized for nephrolithiasis at the end of last year and had an episode of hypotension and chest pain. As result of this a Myoview stress test performed in our office 08/14/14 revealed mild anteroapical ischemia. She has no symptoms. I think this is low risk. Since I saw her back a year ago she's remained currently stable. She denies chest pain, shortness of breath or claudication.   Current Outpatient Prescriptions  Medication Sig Dispense Refill  . acetaminophen (TYLENOL) 325 MG tablet Take 325 mg by mouth 2 (two) times daily as needed. For pain    . aspirin (ASPIRIN EC) 81 MG EC tablet Take 81 mg by mouth daily.     . clopidogrel (PLAVIX) 75 MG tablet Take 75 mg by mouth daily.    Marland Kitchen diltiazem (DILACOR XR) 240 MG 24 hr capsule Take 240 mg by mouth daily.    . Diphenhydramine-APAP, sleep, (TYLENOL PM EXTRA STRENGTH PO) Take 1 tablet by mouth at bedtime as needed (sleep).     . Garlic 10 MG CAPS Take by mouth.    . hydrALAZINE (APRESOLINE) 10 MG tablet Take 1 tablet by mouth 3 (three) times daily.    Marland Kitchen lisinopril-hydrochlorothiazide  (PRINZIDE,ZESTORETIC) 20-12.5 MG per tablet Take 1 tablet by mouth 2 (two) times daily.    . metoprolol tartrate (LOPRESSOR) 25 MG tablet Take 25 mg by mouth every morning.    . Multiple Vitamin (MULITIVITAMIN WITH MINERALS) TABS Take 1 tablet by mouth daily.    Marland Kitchen omega-3 acid ethyl esters (LOVAZA) 1 G capsule Take 1 g by mouth 2 (two) times daily.    Marland Kitchen omeprazole (PRILOSEC) 20 MG capsule Take 20 mg by mouth daily as needed. For GERD    . rosuvastatin (CRESTOR) 5 MG tablet Take 5 mg by mouth daily.    . vitamin C (ASCORBIC ACID) 500 MG tablet Take 500 mg by mouth daily.    Marland Kitchen HYDROcodone-acetaminophen (NORCO/VICODIN) 5-325 MG per tablet Take 2 tablets by mouth every 4 (four) hours as needed. 10 tablet 0   No current facility-administered medications for this visit.    Allergies  Allergen Reactions  . Aspirin Nausea And Vomiting and Other (See Comments)    "pain; hurting"  . Tetanus Toxoids Anaphylaxis and Swelling  . Pravastatin     myalgia    Social History   Social History  . Marital Status: Married    Spouse Name: N/A  . Number of Children: N/A  . Years of Education: N/A   Occupational History  . Not on file.   Social History Main Topics  . Smoking status: Current Some Day Smoker -- 0.50 packs/day for 10 years    Types: Cigarettes  . Smokeless  tobacco: Never Used  . Alcohol Use: No  . Drug Use: No  . Sexual Activity: Yes   Other Topics Concern  . Not on file   Social History Narrative     Review of Systems: General: negative for chills, fever, night sweats or weight changes.  Cardiovascular: negative for chest pain, dyspnea on exertion, edema, orthopnea, palpitations, paroxysmal nocturnal dyspnea or shortness of breath Dermatological: negative for rash Respiratory: negative for cough or wheezing Urologic: negative for hematuria Abdominal: negative for nausea, vomiting, diarrhea, bright red blood per rectum, melena, or hematemesis Neurologic: negative for visual  changes, syncope, or dizziness All other systems reviewed and are otherwise negative except as noted above.    Blood pressure 170/96, pulse 64, height  (1.676 m), weight 185 lb 9.6 oz (84.188 kg).  General appearance: alert and no distress Neck: no adenopathy, no carotid bruit, no JVD, supple, symmetrical, trachea midline and thyroid not enlarged, symmetric, no tenderness/mass/nodules Lungs: clear to auscultation bilaterally Heart: regular rate and rhythm, S1, S2 normal, no murmur, click, rub or gallop Extremities: extremities normal, atraumatic, no cyanosis or edema  EKG sinus rhythm at 61 with septal Q waves. I personally reviewed this EKG  ASSESSMENT AND PLAN:   PVD, Rt SFA PTA 12/29/11,  Lt. PTA SFA 03/01/12  History of peripheral vascular disease status post intervention 04/17/10 to her right common iliac artery and distal left SFA. I restarted her June and August 2013 and intervened on her proximal left SFA. Her most recent Dopplers performed 10/16/13 revealed a right ABI of 1, left ABI 0.95 the patent left SFA and moderately elevated velocities in her left external iliac artery. She denies claudication.  HTN, LVH, Nl LVF May 2010 History of hypertension with blood pressure measured today at 170/96. She says this is "white coat hypertension. She is on diltiazem, hydralazine, lisinopril, hydrochlorothiazide and metoprolol. Continue current meds at current dosing  Dyslipidemia History of dyslipidemia on Crestor followed by her PCP  Smoker History of tobacco abuse continued to smoke 5-6 cigarettes a day recalcitrant to risk factor modification      Runell Gess MD [facility], [facility] 08/23/2015 12:26 PM

## 2015-08-23 NOTE — Assessment & Plan Note (Signed)
History of tobacco abuse continued to smoke 5-6 cigarettes a day recalcitrant to risk factor modification

## 2015-08-23 NOTE — Patient Instructions (Signed)
Medication Instructions:  Your physician recommends that you continue on your current medications as directed. Please refer to the Current Medication list given to you today.   Labwork: I will get your lab work from your Primary Care Physician.   Testing/Procedures: Your physician has requested that you have a lower extremity arterial doppler- During this test, ultrasound is used to evaluate arterial blood flow in the legs. Allow approximately one hour for this exam.    Follow-Up: Your physician wants you to follow-up in: 12 months with Dr. Berry. You will receive a reminder letter in the mail two months in advance. If you don't receive a letter, please call our office to schedule the follow-up appointment.   Any Other Special Instructions Will Be Listed Below (If Applicable).     If you need a refill on your cardiac medications before your next appointment, please call your pharmacy.   

## 2015-08-23 NOTE — Assessment & Plan Note (Signed)
History of dyslipidemia on Crestor followed by her PCP 

## 2015-08-23 NOTE — Assessment & Plan Note (Signed)
History of peripheral vascular disease status post intervention 04/17/10 to her right common iliac artery and distal left SFA. I restarted her June and August 2013 and intervened on her proximal left SFA. Her most recent Dopplers performed 10/16/13 revealed a right ABI of 1, left ABI 0.95 the patent left SFA and moderately elevated velocities in her left external iliac artery. She denies claudication.

## 2015-09-02 ENCOUNTER — Encounter (HOSPITAL_COMMUNITY): Payer: Medicare HMO

## 2015-09-09 ENCOUNTER — Other Ambulatory Visit: Payer: Self-pay | Admitting: Cardiovascular Disease

## 2015-09-09 DIAGNOSIS — I10 Essential (primary) hypertension: Secondary | ICD-10-CM

## 2015-09-09 DIAGNOSIS — E785 Hyperlipidemia, unspecified: Secondary | ICD-10-CM

## 2015-09-09 DIAGNOSIS — I739 Peripheral vascular disease, unspecified: Secondary | ICD-10-CM

## 2015-09-23 ENCOUNTER — Ambulatory Visit (HOSPITAL_COMMUNITY)
Admission: RE | Admit: 2015-09-23 | Discharge: 2015-09-23 | Disposition: A | Discharge status: Home / Self Care | Payer: Medicare HMO | Source: Ambulatory Visit | Attending: Cardiovascular Disease | Admitting: Cardiovascular Disease

## 2015-09-23 DIAGNOSIS — I700 Atherosclerosis of aorta: Secondary | ICD-10-CM | POA: Diagnosis not present

## 2015-09-23 DIAGNOSIS — R938 Abnormal findings on diagnostic imaging of other specified body structures: Secondary | ICD-10-CM | POA: Insufficient documentation

## 2015-09-23 DIAGNOSIS — Z720 Tobacco use: Secondary | ICD-10-CM | POA: Diagnosis not present

## 2015-09-23 DIAGNOSIS — I708 Atherosclerosis of other arteries: Secondary | ICD-10-CM | POA: Insufficient documentation

## 2015-09-23 DIAGNOSIS — I739 Peripheral vascular disease, unspecified: Secondary | ICD-10-CM | POA: Diagnosis not present

## 2015-09-23 DIAGNOSIS — E785 Hyperlipidemia, unspecified: Secondary | ICD-10-CM | POA: Diagnosis not present

## 2015-09-23 DIAGNOSIS — I10 Essential (primary) hypertension: Secondary | ICD-10-CM | POA: Diagnosis not present

## 2015-09-25 ENCOUNTER — Telehealth: Payer: Self-pay

## 2015-09-25 DIAGNOSIS — I739 Peripheral vascular disease, unspecified: Secondary | ICD-10-CM

## 2015-09-25 NOTE — Telephone Encounter (Signed)
-----   Message from Runell Gess, MD sent at 09/25/2015 11:02 AM EST ----- No change from prior study. Repeat in 12 months.

## 2016-06-19 ENCOUNTER — Encounter (HOSPITAL_COMMUNITY): Payer: Self-pay | Admitting: *Deleted

## 2016-06-19 ENCOUNTER — Emergency Department (HOSPITAL_COMMUNITY): Payer: Medicare HMO

## 2016-06-19 ENCOUNTER — Emergency Department (HOSPITAL_COMMUNITY)
Admission: EM | Admit: 2016-06-19 | Discharge: 2016-06-19 | Disposition: A | Discharge status: Home / Self Care | Payer: Medicare HMO | Attending: Emergency Medicine | Admitting: Emergency Medicine

## 2016-06-19 DIAGNOSIS — Z7901 Long term (current) use of anticoagulants: Secondary | ICD-10-CM | POA: Diagnosis not present

## 2016-06-19 DIAGNOSIS — I483 Typical atrial flutter: Secondary | ICD-10-CM | POA: Insufficient documentation

## 2016-06-19 DIAGNOSIS — I10 Essential (primary) hypertension: Secondary | ICD-10-CM | POA: Diagnosis not present

## 2016-06-19 DIAGNOSIS — R002 Palpitations: Secondary | ICD-10-CM

## 2016-06-19 DIAGNOSIS — I499 Cardiac arrhythmia, unspecified: Secondary | ICD-10-CM | POA: Diagnosis present

## 2016-06-19 DIAGNOSIS — F17210 Nicotine dependence, cigarettes, uncomplicated: Secondary | ICD-10-CM | POA: Diagnosis not present

## 2016-06-19 LAB — CBC
HCT: 42.2 % (ref 36.0–46.0)
Hemoglobin: 14 g/dL (ref 12.0–15.0)
MCH: 27.8 pg (ref 26.0–34.0)
MCHC: 33.2 g/dL (ref 30.0–36.0)
MCV: 83.9 fL (ref 78.0–100.0)
PLATELETS: 341 10*3/uL (ref 150–400)
RBC: 5.03 MIL/uL (ref 3.87–5.11)
RDW: 15.5 % (ref 11.5–15.5)
WBC: 13 10*3/uL — ABNORMAL HIGH (ref 4.0–10.5)

## 2016-06-19 LAB — BASIC METABOLIC PANEL
Anion gap: 9 (ref 5–15)
BUN: 17 mg/dL (ref 6–20)
CALCIUM: 9.7 mg/dL (ref 8.9–10.3)
CHLORIDE: 102 mmol/L (ref 101–111)
CO2: 27 mmol/L (ref 22–32)
Creatinine, Ser: 0.89 mg/dL (ref 0.44–1.00)
GFR calc Af Amer: >60 mL/min (ref >60)
GFR calc non Af Amer: >60 mL/min (ref >60)
Glucose, Bld: 135 mg/dL — ABNORMAL HIGH (ref 65–99)
Potassium: 4.1 mmol/L (ref 3.5–5.1)
SODIUM: 138 mmol/L (ref 135–145)

## 2016-06-19 LAB — I-STAT TROPONIN, ED: TROPONIN I, POC: 0 ng/mL (ref 0.00–0.08)

## 2016-06-19 MED ORDER — RIVAROXABAN 20 MG PO TABS: 20.0000 mg | ORAL_TABLET | Freq: Every day | ORAL | 0 refills | Status: DC

## 2016-06-19 MED ORDER — RIVAROXABAN (XARELTO) EDUCATION KIT FOR AFIB PATIENTS
PACK | Freq: Once | Status: DC
  Filled 2016-06-19: qty 1

## 2016-06-19 NOTE — ED Notes (Signed)
EKG delayed due to pt had to to restroom.

## 2016-06-19 NOTE — ED Notes (Signed)
Patient is resting comfortably. 

## 2016-06-19 NOTE — ED Notes (Signed)
Family at bedside. 

## 2016-06-19 NOTE — ED Notes (Signed)
Pt verbalized understanding discharge instructions and denies any further needs or questions at this time. VS stable, ambulatory and steady gait.   

## 2016-06-19 NOTE — ED Provider Notes (Addendum)
[facility] DEPT Provider Note   CSN: [id number] Arrival date & time: 06/19/16  1341     History   Chief Complaint Chief Complaint  Patient presents with  . Irregular Heart Beat    HPI Caryle Helgeson Lasseigne is a 75 y.o. female.  Was at a routine examination and her primary doctor's office when they noticed an irregular heartbeat and sent her here for further evaluation. Patient states that she may have had a regular heartbeat at some point in the past but doesn't know that she has a consistently. She has no chest pain, shortness of breath, leg swelling, back pain, nausea or diaphoresis. No other associated symptoms or modifying factors. No treatment thus far.      Past Medical History:  Diagnosis Date  . Anemia    "long time ago"  . Arthritis    "back; right knee and ankle"  . Dyslipidemia   . GERD (gastroesophageal reflux disease)   . H/O cardiovascular stress test 12/06/08   normal, no ischemia  . History of stomach ulcers 03/01/12   "little bitty ones"  . Hypertension    echo 12/06/08-EF>55%, mild AS and mild pulmonary htn  . Kidney stones   . PVD (peripheral vascular disease) (Newborn) 12/29/2011   h/o right common iliac artery stent, left SFA stent and left SFA directional atherectomy 02/29/12  . Tobacco abuse     Patient Active Problem List   Diagnosis Date Noted  . Transient hypotension and bradycardia in setting of "indigestion" 07/18/2014  . Nephrolithiasis 07/18/2014  . Smoker 03/24/2013  . Renal lesion, incidental finding on CT 12/29/11 12/30/2011  . HTN, LVH, Nl LVF May 2010 12/30/2011  . Dyslipidemia 12/30/2011  . Normal Myoview, May 2010 12/30/2011  . PVD, Rt SFA PTA 12/29/11,  Lt. PTA SFA 03/01/12  12/29/2011  . Claudication in peripheral vascular disease, lifestyle limiting 12/29/2011    Past Surgical History:  Procedure Laterality Date  . APPENDECTOMY    . ATHERECTOMY  03/01/2012  . ATHERECTOMY N/A 12/29/2011   Procedure: ATHERECTOMY;  Surgeon: Lorretta Harp, MD;  Location: [facility] CATH LAB;  Service: Cardiovascular;  Laterality: N/A;  . ATHERECTOMY N/A 03/01/2012   Procedure: ATHERECTOMY;  Surgeon: Lorretta Harp, MD;  Location: [facility] CATH LAB;  Service: Cardiovascular;  Laterality: N/A;  . CHOLECYSTECTOMY    . COLONOSCOPY    . EYE SURGERY     Vitrectomy, and bilateral cataracts  . INGUINAL HERNIA REPAIR  1990's   right  . KNEE ARTHROSCOPY  1990's   bilaterally  . LASER PHOTO ABLATION Right 07/27/2013   Procedure: LASER PHOTO ABLATION;  Surgeon: Hurman Horn, MD;  Location: [facility];  Service: Ophthalmology;  Laterality: Right;  . PARS PLANA VITRECTOMY Right 07/27/2013   Procedure: PARS PLANA VITRECTOMY WITH 25 GAUGE;  Surgeon: Hurman Horn, MD;  Location: [facility];  Service: Ophthalmology;  Laterality: Right;  . PERIPHERAL ARTERIAL STENT GRAFT  03/01/2012   successful directional atheretomy using turbo hawk of a focal prox left SFA lesion   . PV angiogram  12/29/11   successful directinal atherectomy using the turbo hawk to righ SFA stenosis  . PV angiogram  04/17/10   successful PTA/stenting of the left superficial femoral artery and right iliac  . TUBAL LIGATION  1960's  . VAGINAL HYSTERECTOMY  1970's    OB History    No data available       Home Medications    Prior to Admission medications   Medication Sig  Start Date End Date Taking? Authorizing Provider  acetaminophen (TYLENOL) 325 MG tablet Take 325 mg by mouth 2 (two) times daily as needed. For pain    Historical Provider, MD  clopidogrel (PLAVIX) 75 MG tablet Take 75 mg by mouth daily.    Historical Provider, MD  diltiazem (DILACOR XR) 240 MG 24 hr capsule Take 240 mg by mouth daily.    Historical Provider, MD  Diphenhydramine-APAP, sleep, (TYLENOL PM EXTRA STRENGTH PO) Take 1 tablet by mouth at bedtime as needed (sleep).     Historical Provider, MD  Garlic 10 MG CAPS Take by mouth.    Historical Provider, MD  hydrALAZINE (APRESOLINE) 10 MG tablet Take 1 tablet by mouth 3 (three)  times daily. 09/22/13   Historical Provider, MD  HYDROcodone-acetaminophen (NORCO/VICODIN) 5-325 MG per tablet Take 2 tablets by mouth every 4 (four) hours as needed. 07/18/14   Ezequiel Essex, MD  lisinopril-hydrochlorothiazide (PRINZIDE,ZESTORETIC) 20-12.5 MG per tablet Take 1 tablet by mouth 2 (two) times daily.    Historical Provider, MD  metoprolol tartrate (LOPRESSOR) 25 MG tablet Take 25 mg by mouth every morning.    Historical Provider, MD  Multiple Vitamin (MULITIVITAMIN WITH MINERALS) TABS Take 1 tablet by mouth daily.    Historical Provider, MD  omega-3 acid ethyl esters (LOVAZA) 1 G capsule Take 1 g by mouth 2 (two) times daily.    Historical Provider, MD  omeprazole (PRILOSEC) 20 MG capsule Take 20 mg by mouth daily as needed. For GERD    Historical Provider, MD  rivaroxaban (XARELTO) 20 MG TABS tablet Take 1 tablet (20 mg total) by mouth daily with supper. 06/19/16   Merrily Pew, MD  rosuvastatin (CRESTOR) 5 MG tablet Take 5 mg by mouth daily.    Historical Provider, MD  vitamin C (ASCORBIC ACID) 500 MG tablet Take 500 mg by mouth daily.    Historical Provider, MD    Family History History reviewed. No pertinent family history.  Social History Social History  Substance Use Topics  . Smoking status: Current Some Day Smoker    Packs/day: 0.50    Years: 10.00    Types: Cigarettes  . Smokeless tobacco: Never Used  . Alcohol use No     Allergies   Aspirin; Tetanus toxoids; and Pravastatin   Review of Systems Review of Systems  All other systems reviewed and are negative.    Physical Exam Updated Vital Signs BP 140/90 (BP Location: Right Arm)   Pulse 80   Temp 98.2 F (36.8 C) (Oral)   Resp 18   SpO2 98%   Physical Exam  Constitutional: She is oriented to person, place, and time. She appears well-developed and well-nourished.  HENT:  Head: Normocephalic and atraumatic.  Eyes: Conjunctivae and EOM are normal.  Neck: Normal range of motion.  Cardiovascular:  Normal rate and normal heart sounds.  An irregularly irregular rhythm present. Exam reveals no friction rub.   No murmur heard. Pulmonary/Chest: Effort normal. No stridor. No respiratory distress. She has no wheezes.  Abdominal: She exhibits no distension.  Musculoskeletal: Normal range of motion. She exhibits no edema or deformity.  Neurological: She is alert and oriented to person, place, and time. No cranial nerve deficit.  Skin: Skin is warm and dry. No pallor.  Nursing note and vitals reviewed.    ED Treatments / Results  Labs (all labs ordered are listed, but only abnormal results are displayed) Labs Reviewed  BASIC METABOLIC PANEL - Abnormal; Notable for the following:  Result Value   Glucose, Bld 135 (*)    All other components within normal limits  CBC - Abnormal; Notable for the following:    WBC 13.0 (*)    All other components within normal limits  I-STAT TROPOININ, ED    EKG  EKG Interpretation  Date/Time:  Friday June 19 2016 13:49:32 EST Ventricular Rate:  65 PR Interval:    QRS Duration: 90 QT Interval:  396 QTC Calculation: 411 R Axis:   -33 Text Interpretation:  Atrial flutter with variable A-V block Left axis deviation Minimal voltage criteria for LVH, may be normal variant Septal infarct , age undetermined Abnormal ECG Confirmed by [facility] MD, Monita Swier [phone]) on 06/19/2016 5:31:59 PM       Radiology Dg Chest 2 View  Result Date: 06/19/2016 CLINICAL DATA:  A arrhythmia. EXAM: CHEST  2 VIEW COMPARISON:  07/27/2013 FINDINGS: Heart size is normal. There is aortic atherosclerosis. There is mild basilar pulmonary scarring. No sign of active infiltrate, collapse or effusion. Chronic degenerative changes affect the spine. IMPRESSION: No active disease. Aortic atherosclerosis. Mild pulmonary scarring. No CHF. Electronically Signed   By: Nelson Chimes M.D.   On: 06/19/2016 15:00    Procedures Procedures (including critical care time)  Medications Ordered  in ED Medications  rivaroxaban Alveda Reasons) Education Kit for Afib patients (not administered)     Initial Impression / Assessment and Plan / ED Course  I have reviewed the triage vital signs and the nursing notes.  Pertinent labs & imaging results that were available during my care of the patient were reviewed by me and considered in my medical decision making (see chart for details).  Clinical Course     Likely new onset atrial flutter with a variable block. Asymptomatic. Unknown when it started so not a candidate for cardioversion. Patient is already on diltiazem and metoprolol and is rate controlled. CHADSVASC of 5 so I discussed the case with cardiology, Dr. Bronson Ing, who suggests stopping aspirin starting Xarelto and to have her follow-up with her cardiologist. Stable at time of discharge.  Final Clinical Impressions(s) / ED Diagnoses   Final diagnoses:  Palpitations  Typical atrial flutter (HCC)    New Prescriptions New Prescriptions   RIVAROXABAN (XARELTO) 20 MG TABS TABLET    Take 1 tablet (20 mg total) by mouth daily with supper.     Merrily Pew, MD 06/19/16 3536    Merrily Pew, MD 06/19/16 2038

## 2016-06-19 NOTE — ED Triage Notes (Signed)
Pt reports going to pcp office today for lab work and checkup. Pt was sent here due to irreular HR on ekg. Pt reports hx of irregular HR but it was a long time ago and does not take meds for it. Pt denies any cp or sob.

## 2016-06-19 NOTE — Progress Notes (Signed)
ANTICOAGULATION CONSULT NOTE - Initial Consult  Pharmacy Consult for Xarelto Indication: atrial fibrillation  Allergies  Allergen Reactions  . Aspirin Nausea And Vomiting and Other (See Comments)    "pain; hurting"  . Tetanus Toxoids Anaphylaxis and Swelling  . Pravastatin     myalgia    Patient Measurements:    Vital Signs: Temp: 98.2 F (36.8 C) (12/01 1351) Temp Source: Oral (12/01 1351) BP: 148/68 (12/01 1818) Pulse Rate: 62 (12/01 1818)  Labs:  Recent Labs  06/19/16 1354  HGB 14.0  HCT 42.2  PLT 341  CREATININE 0.89    CrCl cannot be calculated (Unknown ideal weight.).   Medical History: Past Medical History:  Diagnosis Date  . Anemia    "long time ago"  . Arthritis    "back; right knee and ankle"  . Dyslipidemia   . GERD (gastroesophageal reflux disease)   . H/O cardiovascular stress test 12/06/08   normal, no ischemia  . History of stomach ulcers 03/01/12   "little bitty ones"  . Hypertension    echo 12/06/08-EF>55%, mild AS and mild pulmonary htn  . Kidney stones   . PVD (peripheral vascular disease) (HCC) 12/29/2011   h/o right common iliac artery stent, left SFA stent and left SFA directional atherectomy 02/29/12  . Tobacco abuse     Medications:   (Not in a hospital admission)  Assessment: Christy Campbell is a 75 yof with remote history of atrial fibrillation, not currently on anticoagulation. She is currently on Plavix and aspirin for a stent place in her leg approximately 3 years ago. Discussed with physician adding Xarelto to dual antiplatelet  Agents and suggested holding aspirin to prevent bleeding risk. Patient has an appointment with her cardiologist next month and recommendation was made to notify them of her ED visit and findings.  Goal of Therapy:  aPTT 66-102 seconds Monitor platelets by anticoagulation protocol: Yes   Plan:  Xarelto 20mg  QD with dinner Monitor renal function and for s/sx of bleeding  Youlanda Tomassetti L  Lacora Folmer 06/19/2016,6:44 PM

## 2016-06-22 ENCOUNTER — Telehealth (HOSPITAL_COMMUNITY): Payer: Self-pay | Admitting: *Deleted

## 2016-06-22 NOTE — Telephone Encounter (Signed)
I contacted pt to sched follow up appt with Rudi Coco, NP since her closest appt with [facility] is in Jan.  Pt in ED for afib over the weekend..  Pt refuses appt stating she only wants to see her doctor.  No appt to be made at this time.

## 2016-07-04 ENCOUNTER — Encounter (HOSPITAL_COMMUNITY): Payer: Self-pay | Admitting: Emergency Medicine

## 2016-07-04 ENCOUNTER — Emergency Department (HOSPITAL_COMMUNITY)
Admission: EM | Admit: 2016-07-04 | Discharge: 2016-07-04 | Disposition: A | Discharge status: Home / Self Care | Payer: Medicare HMO | Attending: Emergency Medicine | Admitting: Emergency Medicine

## 2016-07-04 DIAGNOSIS — F17210 Nicotine dependence, cigarettes, uncomplicated: Secondary | ICD-10-CM | POA: Diagnosis not present

## 2016-07-04 DIAGNOSIS — Y9389 Activity, other specified: Secondary | ICD-10-CM | POA: Insufficient documentation

## 2016-07-04 DIAGNOSIS — Z7901 Long term (current) use of anticoagulants: Secondary | ICD-10-CM | POA: Diagnosis not present

## 2016-07-04 DIAGNOSIS — Y999 Unspecified external cause status: Secondary | ICD-10-CM | POA: Diagnosis not present

## 2016-07-04 DIAGNOSIS — Y929 Unspecified place or not applicable: Secondary | ICD-10-CM | POA: Diagnosis not present

## 2016-07-04 DIAGNOSIS — S91311A Laceration without foreign body, right foot, initial encounter: Secondary | ICD-10-CM | POA: Diagnosis not present

## 2016-07-04 DIAGNOSIS — W268XXA Contact with other sharp object(s), not elsewhere classified, initial encounter: Secondary | ICD-10-CM | POA: Diagnosis not present

## 2016-07-04 DIAGNOSIS — I10 Essential (primary) hypertension: Secondary | ICD-10-CM | POA: Diagnosis not present

## 2016-07-04 NOTE — ED Triage Notes (Signed)
Pt. Stated, I cut myself with a new razor and I must have hit a vein cause it keeps bleeding. 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

## 2016-07-04 NOTE — ED Provider Notes (Signed)
[facility]-EMERGENCY DEPT Provider Note   CSN: [id number] Arrival date & time: 07/04/16  1437     History   Chief Complaint Chief Complaint  Patient presents with  . Leg Injury    cut a vein  . Extremity Laceration    HPI Christy Campbell is a 75 y.o. female.  The history is provided by the patient and the spouse.  Laceration   The incident occurred 1 to 2 hours ago. The laceration is located on the right foot. Size: 0.5cm. The laceration mechanism was a a razor. The patient is experiencing no pain.  -pt nicked a varicose vein with shaving razor and had persistent bleeding. Went to urgent care who said they can't do stitches and sent pt to Ed. -Pt on Xarelto since last 3d for a fib and plavix for leg stents.   Past Medical History:  Diagnosis Date  . Anemia    "long time ago"  . Arthritis    "back; right knee and ankle"  . Dyslipidemia   . GERD (gastroesophageal reflux disease)   . H/O cardiovascular stress test 12/06/08   normal, no ischemia  . History of stomach ulcers 03/01/12   "little bitty ones"  . Hypertension    echo 12/06/08-EF>55%, mild AS and mild pulmonary htn  . Kidney stones   . PVD (peripheral vascular disease) (HCC) 12/29/2011   h/o right common iliac artery stent, left SFA stent and left SFA directional atherectomy 02/29/12  . Tobacco abuse     Patient Active Problem List   Diagnosis Date Noted  . Transient hypotension and bradycardia in setting of "indigestion" 07/18/2014  . Nephrolithiasis 07/18/2014  . Smoker 03/24/2013  . Renal lesion, incidental finding on CT 12/29/11 12/30/2011  . HTN, LVH, Nl LVF May 2010 12/30/2011  . Dyslipidemia 12/30/2011  . Normal Myoview, May 2010 12/30/2011  . PVD, Rt SFA PTA 12/29/11,  Lt. PTA SFA 03/01/12  12/29/2011  . Claudication in peripheral vascular disease, lifestyle limiting 12/29/2011    Past Surgical History:  Procedure Laterality Date  . APPENDECTOMY    . ATHERECTOMY  03/01/2012  . ATHERECTOMY N/A 12/29/2011    Procedure: ATHERECTOMY;  Surgeon: Runell Gess, MD;  Location: [facility] CATH LAB;  Service: Cardiovascular;  Laterality: N/A;  . ATHERECTOMY N/A 03/01/2012   Procedure: ATHERECTOMY;  Surgeon: Runell Gess, MD;  Location: [facility] CATH LAB;  Service: Cardiovascular;  Laterality: N/A;  . CHOLECYSTECTOMY    . COLONOSCOPY    . EYE SURGERY     Vitrectomy, and bilateral cataracts  . INGUINAL HERNIA REPAIR  1990's   right  . KNEE ARTHROSCOPY  1990's   bilaterally  . LASER PHOTO ABLATION Right 07/27/2013   Procedure: LASER PHOTO ABLATION;  Surgeon: Edmon Crape, MD;  Location: [facility] OR;  Service: Ophthalmology;  Laterality: Right;  . PARS PLANA VITRECTOMY Right 07/27/2013   Procedure: PARS PLANA VITRECTOMY WITH 25 GAUGE;  Surgeon: Edmon Crape, MD;  Location: [facility] OR;  Service: Ophthalmology;  Laterality: Right;  . PERIPHERAL ARTERIAL STENT GRAFT  03/01/2012   successful directional atheretomy using turbo hawk of a focal prox left SFA lesion   . PV angiogram  12/29/11   successful directinal atherectomy using the turbo hawk to righ SFA stenosis  . PV angiogram  04/17/10   successful PTA/stenting of the left superficial femoral artery and right iliac  . TUBAL LIGATION  1960's  . VAGINAL HYSTERECTOMY  1970's    OB History    No data  available       Home Medications    Prior to Admission medications   Medication Sig Start Date End Date Taking? Authorizing Provider  acetaminophen (TYLENOL) 325 MG tablet Take 325 mg by mouth 2 (two) times daily as needed. For pain   Yes Historical Provider, MD  clopidogrel (PLAVIX) 75 MG tablet Take 75 mg by mouth daily.   Yes Historical Provider, MD  diltiazem (DILACOR XR) 240 MG 24 hr capsule Take 360 mg by mouth daily.    Yes Historical Provider, MD  Garlic 10 MG CAPS Take 1 capsule by mouth daily.    Yes Historical Provider, MD  hydrALAZINE (APRESOLINE) 10 MG tablet Take 1 tablet by mouth 3 (three) times daily. 09/22/13  Yes Historical Provider, MD    hydrochlorothiazide (HYDRODIURIL) 25 MG tablet Take 25 mg by mouth daily.   Yes Historical Provider, MD  lisinopril (PRINIVIL,ZESTRIL) 40 MG tablet Take 40 mg by mouth daily.   Yes Historical Provider, MD  metoprolol tartrate (LOPRESSOR) 25 MG tablet Take 25 mg by mouth 2 (two) times daily.    Yes Historical Provider, MD  Multiple Vitamin (MULITIVITAMIN WITH MINERALS) TABS Take 1 tablet by mouth daily.   Yes Historical Provider, MD  omega-3 acid ethyl esters (LOVAZA) 1 G capsule Take 1 g by mouth daily.    Yes Historical Provider, MD  omeprazole (PRILOSEC) 20 MG capsule Take 20 mg by mouth daily as needed. For GERD   Yes Historical Provider, MD  rivaroxaban (XARELTO) 20 MG TABS tablet Take 1 tablet (20 mg total) by mouth daily with supper. 06/19/16  Yes Marily Memos, MD  vitamin C (ASCORBIC ACID) 500 MG tablet Take 500 mg by mouth daily.   Yes Historical Provider, MD    Family History No family history on file.  Social History Social History  Substance Use Topics  . Smoking status: Current Some Day Smoker    Packs/day: 0.50    Years: 10.00    Types: Cigarettes  . Smokeless tobacco: Never Used  . Alcohol use No     Allergies   Aspirin; Tetanus toxoids; and Pravastatin   Review of Systems Review of Systems  Constitutional: Negative for fever.  Respiratory: Negative for shortness of breath.   Gastrointestinal: Negative for abdominal pain.  Skin: Positive for wound.  Neurological: Negative for light-headedness.  Hematological: Bruises/bleeds easily.  All other systems reviewed and are negative.    Physical Exam Updated Vital Signs BP 156/74 (BP Location: Left Arm)   Pulse (!) 58   Temp 98.1 F (36.7 C) (Oral)   Resp 18   Ht 5\' 7"  (1.702 m)   Wt 81.6 kg   SpO2 100%   BMI 28.19 kg/m   Physical Exam  Constitutional: She appears well-developed and well-nourished. No distress.  HENT:  Head: Normocephalic and atraumatic.  Eyes: Conjunctivae are normal.  Neck: Neck  supple.  Cardiovascular: An irregularly irregular rhythm present.  No murmur heard. Pulmonary/Chest: Effort normal and breath sounds normal. No respiratory distress.  Abdominal: Soft. There is no tenderness.  Musculoskeletal: She exhibits no edema.       Feet:  Neurological: She is alert.  Skin: Skin is warm and dry.  Psychiatric: She has a normal mood and affect.  Nursing note and vitals reviewed.    ED Treatments / Results  Labs (all labs ordered are listed, but only abnormal results are displayed) Labs Reviewed - No data to display  EKG  EKG Interpretation None  Radiology No results found.  Procedures .Marland Kitchen.Laceration Repair Date/Time: 07/04/2016 4:30 PM Performed by: Dwana MelenaNG, Burnett Spray Authorized by: Gerhard MunchLOCKWOOD, ROBERT   Consent:    Consent obtained:  Verbal   Consent given by:  Patient   Risks discussed:  Pain and infection Anesthesia (see MAR for exact dosages):    Anesthesia method: freeze spray. Laceration details:    Location:  Foot   Foot location:  Top of R foot   Length (cm):  0.5 Repair type:    Repair type:  Simple Pre-procedure details:    Preparation:  Patient was prepped and draped in usual sterile fashion Exploration:    Hemostasis obtained with: suture.   Wound extent: vascular damage (lacerated small varicose vein)     Contaminated: no   Treatment:    Area cleansed with:  Saline   Amount of cleaning:  Standard   Irrigation solution:  Sterile saline   Irrigation method:  Syringe Skin repair:    Repair method:  Sutures Approximation:    Approximation:  Close   Vermilion border: well-aligned   Post-procedure details:    Dressing:  Antibiotic ointment and sterile dressing   Patient tolerance of procedure:  Tolerated well, no immediate complications    Medications Ordered in ED Medications - No data to display   Initial Impression / Assessment and Plan / ED Course  I have reviewed the triage vital signs and the nursing  notes.  Pertinent labs & imaging results that were available during my care of the patient were reviewed by me and considered in my medical decision making (see chart for details).  Clinical Course     Patient is a 75 year old female with history of A. fib, leg stones, hypertension, PVD who presents with small right foot laceration while shaving. Patient is on blood thinners Xarelto and Plavix. Next  Patient attempted to achieve hemostasis at home and was seen a negative care who told her they cannot do stitches.  I attempted to achieve hemostasis with direct pressure however the wound continued to bleed. One 5-0 Vicryl suture was placed with immediate hemostasis achieved. Pressure dressing was applied on top of the wound.   Patient is discharged home in good condition. Patient seen with attending, Dr. Jeraldine LootsLockwood  Final Clinical Impressions(s) / ED Diagnoses   Final diagnoses:  Laceration of right foot, initial encounter    New Prescriptions New Prescriptions   No medications on file     Dwana MelenaRobin Colen Eltzroth, DO 07/04/16 1634    Gerhard Munchobert Lockwood, MD 07/05/16 [phone]

## 2016-07-04 NOTE — ED Notes (Signed)
Resident at bedside suturing patient laceration.

## 2016-07-22 ENCOUNTER — Ambulatory Visit (INDEPENDENT_AMBULATORY_CARE_PROVIDER_SITE_OTHER): Payer: Medicare HMO | Admitting: Cardiovascular Disease

## 2016-07-22 ENCOUNTER — Encounter: Payer: Self-pay | Admitting: Cardiovascular Disease

## 2016-07-22 VITALS — BP 161/80 | HR 77 | Ht 66.0 in | Wt 188.8 lb

## 2016-07-22 DIAGNOSIS — I10 Essential (primary) hypertension: Secondary | ICD-10-CM

## 2016-07-22 DIAGNOSIS — I481 Persistent atrial fibrillation: Secondary | ICD-10-CM | POA: Diagnosis not present

## 2016-07-22 DIAGNOSIS — Z01812 Encounter for preprocedural laboratory examination: Secondary | ICD-10-CM

## 2016-07-22 DIAGNOSIS — I739 Peripheral vascular disease, unspecified: Secondary | ICD-10-CM

## 2016-07-22 DIAGNOSIS — E785 Hyperlipidemia, unspecified: Secondary | ICD-10-CM

## 2016-07-22 DIAGNOSIS — I4819 Other persistent atrial fibrillation: Secondary | ICD-10-CM

## 2016-07-22 DIAGNOSIS — I4891 Unspecified atrial fibrillation: Secondary | ICD-10-CM | POA: Insufficient documentation

## 2016-07-22 NOTE — Patient Instructions (Signed)
Medication Instructions: Your physician recommends that you continue on your current medications as directed. Please refer to the Current Medication list given to you today.   Labwork: Your physician recommends that you return for lab work: BMP, CBC, PTT, PT-INR, TSH   Testing/Procedures: Your physician has recommended that you have a Cardioversion (DCCV)--schedule for end of January-early February. Electrical Cardioversion uses a jolt of electricity to your heart either through paddles or wired patches attached to your chest. This is a controlled, usually prescheduled, procedure. Defibrillation is done under light anesthesia in the hospital, and you usually go home the day of the procedure. This is done to get your heart back into a normal rhythm. You are not awake for the procedure. Please see the instruction sheet given to you today.    Follow-Up: Your physician recommends that you schedule a follow-up appointment with Dr. Allyson Sabal after your procedure.  If you need a refill on your cardiac medications before your next appointment, please call your pharmacy.

## 2016-07-22 NOTE — Assessment & Plan Note (Signed)
The patient was found to be in A. fib when seen in the emergency room on 06/19/16 and begun on Xarelto . This is new for her. She was seen in the ER several weeks later because of bleeding in her foot as a result of trauma.Her CHA2DsVASc2 score was equal to 5. She does complain of some fatigue. I'm going to arrange for her to undergo outpatient DC cardioversion in the next 3-4 weeks.

## 2016-07-22 NOTE — Assessment & Plan Note (Signed)
History of hypertension blood pressure measured at 161/80. She is on hydralazine, hydrochlorothiazide, lisinopril and metoprolol. Continue current meds at current dosing

## 2016-07-22 NOTE — Progress Notes (Signed)
07/22/2016 Christy Campbell   25-May-1941  [id number]  Primary Physician [facility] Primary Cardiologist: Runell Gess MD Nicholes [place], [place]  HPI:  The patient is a very pleasant, 76 year old, mild to moderately overweight, married Caucasian female, mother of 2, grandmother to 3 grandchildren who I saw 08/23/15. She has a history of continued tobacco abuse 5 to 6 cigarettes a day, treated hypertension and dyslipidemia. 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 Her most recent Dopplers performed 10/16/13 revealed ABIs of close to 1 bilaterally with a moderately-sized signal in the left external iliac artery. She denies claudication. She was hospitalized for nephrolithiasis at the end of last year and had an episode of hypotension and chest pain. As result of this a Myoview stress test performed in our office 08/14/14 revealed mild anteroapical ischemia. She has no symptoms. I think this is low risk. Since I saw her back a year ago she's remained currently stable. She denies chest pain, shortness of breath or claudication. She has complained of some fatigue. She was seen in the emergency room 06/19/16 with new onset A. fib. She was started on Xarelto. Her CHA2DsVAS2c score was equal to 5.   Current Outpatient Prescriptions  Medication Sig Dispense Refill  . acetaminophen (TYLENOL) 325 MG tablet Take 325 mg by mouth 2 (two) times daily as needed. For pain    . clopidogrel (PLAVIX) 75 MG tablet Take 75 mg by mouth daily.    Marland Kitchen diltiazem (DILACOR XR) 240 MG 24 hr capsule Take 360 mg by mouth daily.     . Garlic 10 MG CAPS Take 1 capsule by mouth daily.     . hydrALAZINE (APRESOLINE) 10 MG tablet Take 1 tablet by mouth 3 (three) times daily.    . hydrochlorothiazide (HYDRODIURIL) 25 MG  tablet Take 25 mg by mouth daily.    Marland Kitchen lisinopril (PRINIVIL,ZESTRIL) 40 MG tablet Take 40 mg by mouth daily.    . metoprolol tartrate (LOPRESSOR) 25 MG tablet Take 25 mg by mouth 2 (two) times daily.     . Multiple Vitamin (MULITIVITAMIN WITH MINERALS) TABS Take 1 tablet by mouth daily.    Marland Kitchen omega-3 acid ethyl esters (LOVAZA) 1 G capsule Take 1 g by mouth daily.     Marland Kitchen omeprazole (PRILOSEC) 20 MG capsule Take 20 mg by mouth daily as needed. For GERD    . rivaroxaban (XARELTO) 20 MG TABS tablet Take 1 tablet (20 mg total) by mouth daily with supper. 30 tablet 0  . vitamin C (ASCORBIC ACID) 500 MG tablet Take 500 mg by mouth daily.     No current facility-administered medications for this visit.     Allergies  Allergen Reactions  . Aspirin Nausea And Vomiting and Other (See Comments)    "pain; hurting"  . Tetanus Toxoids Anaphylaxis and Swelling  . Pravastatin     myalgia    Social History   Social History  . Marital status: Married    Spouse name: N/A  . Number of children: N/A  . Years of education: N/A   Occupational History  . Not on file.   Social History Main Topics  . Smoking status: Current Some Day Smoker    Packs/day: 0.50    Years: 10.00    Types: Cigarettes  .  Smokeless tobacco: Never Used  . Alcohol use No  . Drug use: No  . Sexual activity: Yes   Other Topics Concern  . Not on file   Social History Narrative  . No narrative on file     Review of Systems: General: negative for chills, fever, night sweats or weight changes.  Cardiovascular: negative for chest pain, dyspnea on exertion, edema, orthopnea, palpitations, paroxysmal nocturnal dyspnea or shortness of breath Dermatological: negative for rash Respiratory: negative for cough or wheezing Urologic: negative for hematuria Abdominal: negative for nausea, vomiting, diarrhea, bright red blood per rectum, melena, or hematemesis Neurologic: negative for visual changes, syncope, or dizziness All other  systems reviewed and are otherwise negative except as noted above.    Blood pressure (!) 161/80, pulse 77, height 5\' 6"  (1.676 m), weight 188 lb 12.8 oz (85.6 kg).  General appearance: alert and no distress Neck: no adenopathy, no carotid bruit, no JVD, supple, symmetrical, trachea midline and thyroid not enlarged, symmetric, no tenderness/mass/nodules Lungs: clear to auscultation bilaterally Heart: irregularly irregular rhythm Extremities: extremities normal, atraumatic, no cyanosis or edema  EKG atrial fibrillation with a ventricular response of 66 and septal Q waves. I personally reviewed this EKG  ASSESSMENT AND PLAN:   PVD, Rt SFA PTA 12/29/11,  Lt. PTA SFA 03/01/12  History of peripheral arterial disease status post right common iliac artery stent, left SFA stent the left SFA directional atherectomy as recently as 03/01/2012. She really denies claudication. Her most recent Dopplers performed 09/23/15 revealed a right ABI 0.7 to the left 0.65. There were no obvious high-frequency signals.  HTN, LVH, Nl LVF May 2010 History of hypertension blood pressure measured at 161/80. She is on hydralazine, hydrochlorothiazide, lisinopril and metoprolol. Continue current meds at current dosing  Dyslipidemia History of dyslipidemia not on statin therapy followed by her PCP  Atrial fibrillation [facility]) The patient was found to be in A. fib when seen in the emergency room on 06/19/16 and begun on Xarelto . This is new for her. She was seen in the ER several weeks later because of bleeding in her foot as a result of trauma.Her CHA2DsVASc2 score was equal to 5. She does complain of some fatigue. I'm going to arrange for her to undergo outpatient DC cardioversion in the next 3-4 weeks.      Runell Gess MD FACP,FACC,FAHA, [facility] 07/22/2016 2:26 PM

## 2016-07-22 NOTE — Assessment & Plan Note (Signed)
History of peripheral arterial disease status post right common iliac artery stent, left SFA stent the left SFA directional atherectomy as recently as 03/01/2012. She really denies claudication. Her most recent Dopplers performed 09/23/15 revealed a right ABI 0.7 to the left 0.65. There were no obvious high-frequency signals.

## 2016-07-22 NOTE — Assessment & Plan Note (Signed)
History of dyslipidemia not on statin therapy followed by her PCP 

## 2016-07-23 ENCOUNTER — Telehealth: Payer: Self-pay | Admitting: Cardiovascular Disease

## 2016-07-23 NOTE — Telephone Encounter (Signed)
New Message  Pt voiced she is wanting to know what medication does she need to stop taking, she forgot.  Please f/u with pt

## 2016-07-23 NOTE — Telephone Encounter (Signed)
Spoke to patient. States she received verbal instruction that she does not need to continue the Plavix. Notes she is on Xarelto. Written instructions from yesterday's OV indicate no changes, so she needs clarification on this. Please advise

## 2016-07-24 NOTE — Telephone Encounter (Signed)
She can discontinue her Plavix since she is on Xarelto .

## 2016-07-24 NOTE — Telephone Encounter (Signed)
Pt advised, voiced understanding and thanks. 

## 2016-07-28 ENCOUNTER — Other Ambulatory Visit: Payer: Self-pay | Admitting: *Deleted

## 2016-07-28 MED ORDER — RIVAROXABAN 20 MG PO TABS: 20.0000 mg | ORAL_TABLET | Freq: Every day | ORAL | 5 refills | Status: DC

## 2016-08-03 LAB — CBC WITH DIFFERENTIAL/PLATELET
BASOS PCT: 0 %
Basophils Absolute: 0 cells/uL (ref 0–200)
EOS PCT: 1 %
Eosinophils Absolute: 118 cells/uL (ref 15–500)
HEMATOCRIT: 40.2 % (ref 35.0–45.0)
HEMOGLOBIN: 13 g/dL (ref 11.7–15.5)
LYMPHS ABS: 2124 {cells}/uL (ref 850–3900)
Lymphocytes Relative: 18 %
MCH: 26.7 pg — ABNORMAL LOW (ref 27.0–33.0)
MCHC: 32.3 g/dL (ref 32.0–36.0)
MCV: 82.7 fL (ref 80.0–100.0)
MONO ABS: 708 {cells}/uL (ref 200–950)
MPV: 9.1 fL (ref 7.5–12.5)
Monocytes Relative: 6 %
NEUTROS PCT: 75 %
Neutro Abs: 8850 cells/uL — ABNORMAL HIGH (ref 1500–7800)
Platelets: 357 10*3/uL (ref 140–400)
RBC: 4.86 MIL/uL (ref 3.80–5.10)
RDW: 14.4 % (ref 11.0–15.0)
WBC: 11.8 10*3/uL — AB (ref 3.8–10.8)

## 2016-08-04 ENCOUNTER — Telehealth: Payer: Self-pay | Admitting: Cardiovascular Disease

## 2016-08-04 LAB — BASIC METABOLIC PANEL WITH GFR
BUN: 25 mg/dL (ref 7–25)
CHLORIDE: 102 mmol/L (ref 98–110)
CO2: 29 mmol/L (ref 20–31)
Calcium: 9.3 mg/dL (ref 8.6–10.4)
Creat: 0.92 mg/dL (ref 0.60–0.93)
GFR, EST AFRICAN AMERICAN: 70 mL/min (ref >=60)
GFR, EST NON AFRICAN AMERICAN: 61 mL/min (ref >=60)
Glucose, Bld: 74 mg/dL (ref 65–99)
POTASSIUM: 4.5 mmol/L (ref 3.5–5.3)
SODIUM: 139 mmol/L (ref 135–146)

## 2016-08-04 LAB — PROTIME-INR
INR: 1.1
PROTHROMBIN TIME: 11.7 s — AB (ref 9.0–11.5)

## 2016-08-04 LAB — TSH: TSH: 1.13 mIU/L

## 2016-08-04 LAB — APTT: APTT: 31 s (ref 22–34)

## 2016-08-04 NOTE — Telephone Encounter (Signed)
Returning your call. °

## 2016-08-10 ENCOUNTER — Ambulatory Visit (HOSPITAL_COMMUNITY): Payer: Medicare HMO | Admitting: Anesthesiology

## 2016-08-10 ENCOUNTER — Ambulatory Visit (HOSPITAL_COMMUNITY)
Admission: RE | Admit: 2016-08-10 | Discharge: 2016-08-10 | Disposition: A | Discharge status: Home / Self Care | Payer: Medicare HMO | Source: Ambulatory Visit | Attending: Cardiovascular Disease | Admitting: Cardiovascular Disease

## 2016-08-10 ENCOUNTER — Encounter (HOSPITAL_COMMUNITY)
Admission: RE | Disposition: A | Discharge status: Home / Self Care | Payer: Self-pay | Source: Ambulatory Visit | Attending: Cardiovascular Disease

## 2016-08-10 ENCOUNTER — Encounter (HOSPITAL_COMMUNITY): Payer: Self-pay | Admitting: *Deleted

## 2016-08-10 DIAGNOSIS — K219 Gastro-esophageal reflux disease without esophagitis: Secondary | ICD-10-CM | POA: Insufficient documentation

## 2016-08-10 DIAGNOSIS — I10 Essential (primary) hypertension: Secondary | ICD-10-CM | POA: Insufficient documentation

## 2016-08-10 DIAGNOSIS — I481 Persistent atrial fibrillation: Secondary | ICD-10-CM | POA: Diagnosis not present

## 2016-08-10 DIAGNOSIS — Z7902 Long term (current) use of antithrombotics/antiplatelets: Secondary | ICD-10-CM | POA: Diagnosis not present

## 2016-08-10 DIAGNOSIS — I4819 Other persistent atrial fibrillation: Secondary | ICD-10-CM

## 2016-08-10 DIAGNOSIS — F17210 Nicotine dependence, cigarettes, uncomplicated: Secondary | ICD-10-CM | POA: Insufficient documentation

## 2016-08-10 DIAGNOSIS — I739 Peripheral vascular disease, unspecified: Secondary | ICD-10-CM | POA: Insufficient documentation

## 2016-08-10 DIAGNOSIS — I4891 Unspecified atrial fibrillation: Secondary | ICD-10-CM | POA: Diagnosis present

## 2016-08-10 DIAGNOSIS — Z79899 Other long term (current) drug therapy: Secondary | ICD-10-CM | POA: Diagnosis not present

## 2016-08-10 DIAGNOSIS — Z7901 Long term (current) use of anticoagulants: Secondary | ICD-10-CM | POA: Diagnosis not present

## 2016-08-10 DIAGNOSIS — E785 Hyperlipidemia, unspecified: Secondary | ICD-10-CM | POA: Insufficient documentation

## 2016-08-10 DIAGNOSIS — Z95820 Peripheral vascular angioplasty status with implants and grafts: Secondary | ICD-10-CM | POA: Insufficient documentation

## 2016-08-10 HISTORY — PX: CARDIOVERSION: SHX1299

## 2016-08-10 SURGERY — CARDIOVERSION
Anesthesia: General

## 2016-08-10 MED ORDER — SODIUM CHLORIDE 0.9 % IV SOLN
INTRAVENOUS | Status: DC
  Administered 2016-08-10: 13:00:00 via INTRAVENOUS

## 2016-08-10 MED ORDER — PROPOFOL 10 MG/ML IV BOLUS
INTRAVENOUS | Status: DC | PRN
  Administered 2016-08-10 (×2): 40 mg via INTRAVENOUS

## 2016-08-10 NOTE — Transfer of Care (Signed)
Immediate Anesthesia Transfer of Care Note  Patient: Christy Campbell  Procedure(s) Performed: Procedure(s): CARDIOVERSION (N/A)  Patient Location: Endoscopy Unit  Anesthesia Type:General  Level of Consciousness: awake, alert  and oriented  Airway & Oxygen Therapy: Patient Spontanous Breathing and Patient connected to nasal cannula oxygen  Post-op Assessment: Report given to RN, Post -op Vital signs reviewed and stable and Patient moving all extremities  Post vital signs: Reviewed and stable  Last Vitals:  Vitals:   08/10/16 1248  BP: (!) 171/88  Pulse: 87  Resp: 17  Temp: 36.8 C    Last Pain:  Vitals:   08/10/16 1248  TempSrc: Oral         Complications: No apparent anesthesia complications

## 2016-08-10 NOTE — Interval H&P Note (Signed)
History and Physical Interval Note:  08/10/2016 1:00 PM  Shay A Okazaki  has presented today for surgery, with the diagnosis of afib  The various methods of treatment have been discussed with the patient and family. After consideration of risks, benefits and other options for treatment, the patient has consented to  Procedure(s): CARDIOVERSION (N/A) as a surgical intervention .  The patient's history has been reviewed, patient examined, no change in status, stable for surgery.  I have reviewed the patient's chart and labs.  Questions were answered to the patient's satisfaction.     Carlie Solorzano

## 2016-08-10 NOTE — Discharge Instructions (Signed)
Electrical Cardioversion, Care After °This sheet gives you information about how to care for yourself after your procedure. Your health care provider may also give you more specific instructions. If you have problems or questions, contact your health care provider. °What can I expect after the procedure? °After the procedure, it is common to have: °· Some redness on the skin where the shocks were given. °Follow these instructions at home: °· Do not drive for 24 hours if you were given a medicine to help you relax (sedative). °· Take over-the-counter and prescription medicines only as told by your health care provider. °· Ask your health care provider how to check your pulse. Check it often. °· Rest for 48 hours after the procedure or as told by your health care provider. °· Avoid or limit your caffeine use as told by your health care provider. °Contact a health care provider if: °· You feel like your heart is beating too quickly or your pulse is not regular. °· You have a serious muscle cramp that does not go away. °Get help right away if: °· You have discomfort in your chest. °· You are dizzy or you feel faint. °· You have trouble breathing or you are short of breath. °· Your speech is slurred. °· You have trouble moving an arm or leg on one side of your body. °· Your fingers or toes turn cold or blue. °This information is not intended to replace advice given to you by your health care provider. Make sure you discuss any questions you have with your health care provider. °Document Released: 04/26/2013 Document Revised: 02/07/2016 Document Reviewed: 01/10/2016 °Elsevier Interactive Patient Education © 2017 [employer] ° °

## 2016-08-10 NOTE — H&P (View-Only) (Signed)
07/22/2016 Lesley Grauer [place]   25-May-1941  [id number]  Primary Physician [facility] Primary Cardiologist: Runell Gess MD Nicholes Calam[place], [place]  HPI:  The patient is a very pleasant, 76 year old, mild to moderately overweight, married Caucasian female, mother of 2, grandmother to 3 grandchildren who I saw 08/23/15. She has a history of continued tobacco abuse 5 to 6 cigarettes a day, treated hypertension and dyslipidemia. 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 Her most recent Dopplers performed 10/16/13 revealed ABIs of close to 1 bilaterally with a moderately-sized signal in the left external iliac artery. She denies claudication. She was hospitalized for nephrolithiasis at the end of last year and had an episode of hypotension and chest pain. As result of this a Myoview stress test performed in our office 08/14/14 revealed mild anteroapical ischemia. She has no symptoms. I think this is low risk. Since I saw her back a year ago she's remained currently stable. She denies chest pain, shortness of breath or claudication. She has complained of some fatigue. She was seen in the emergency room 06/19/16 with new onset A. fib. She was started on Xarelto. Her CHA2DsVAS2c score was equal to 5.   Current Outpatient Prescriptions  Medication Sig Dispense Refill  . acetaminophen (TYLENOL) 325 MG tablet Take 325 mg by mouth 2 (two) times daily as needed. For pain    . clopidogrel (PLAVIX) 75 MG tablet Take 75 mg by mouth daily.    Marland Kitchen diltiazem (DILACOR XR) 240 MG 24 hr capsule Take 360 mg by mouth daily.     . Garlic 10 MG CAPS Take 1 capsule by mouth daily.     . hydrALAZINE (APRESOLINE) 10 MG tablet Take 1 tablet by mouth 3 (three) times daily.    . hydrochlorothiazide (HYDRODIURIL) 25 MG  tablet Take 25 mg by mouth daily.    Marland Kitchen lisinopril (PRINIVIL,ZESTRIL) 40 MG tablet Take 40 mg by mouth daily.    . metoprolol tartrate (LOPRESSOR) 25 MG tablet Take 25 mg by mouth 2 (two) times daily.     . Multiple Vitamin (MULITIVITAMIN WITH MINERALS) TABS Take 1 tablet by mouth daily.    Marland Kitchen omega-3 acid ethyl esters (LOVAZA) 1 G capsule Take 1 g by mouth daily.     Marland Kitchen omeprazole (PRILOSEC) 20 MG capsule Take 20 mg by mouth daily as needed. For GERD    . rivaroxaban (XARELTO) 20 MG TABS tablet Take 1 tablet (20 mg total) by mouth daily with supper. 30 tablet 0  . vitamin C (ASCORBIC ACID) 500 MG tablet Take 500 mg by mouth daily.     No current facility-administered medications for this visit.     Allergies  Allergen Reactions  . Aspirin Nausea And Vomiting and Other (See Comments)    "pain; hurting"  . Tetanus Toxoids Anaphylaxis and Swelling  . Pravastatin     myalgia    Social History   Social History  . Marital status: Married    Spouse name: N/A  . Number of children: N/A  . Years of education: N/A   Occupational History  . Not on file.   Social History Main Topics  . Smoking status: Current Some Day Smoker    Packs/day: 0.50    Years: 10.00    Types: Cigarettes  .  Smokeless tobacco: Never Used  . Alcohol use No  . Drug use: No  . Sexual activity: Yes   Other Topics Concern  . Not on file   Social History Narrative  . No narrative on file     Review of Systems: General: negative for chills, fever, night sweats or weight changes.  Cardiovascular: negative for chest pain, dyspnea on exertion, edema, orthopnea, palpitations, paroxysmal nocturnal dyspnea or shortness of breath Dermatological: negative for rash Respiratory: negative for cough or wheezing Urologic: negative for hematuria Abdominal: negative for nausea, vomiting, diarrhea, bright red blood per rectum, melena, or hematemesis Neurologic: negative for visual changes, syncope, or dizziness All other  systems reviewed and are otherwise negative except as noted above.    Blood pressure (!) 161/80, pulse 77, height 5\' 6"  (1.676 m), weight 188 lb 12.8 oz (85.6 kg).  General appearance: alert and no distress Neck: no adenopathy, no carotid bruit, no JVD, supple, symmetrical, trachea midline and thyroid not enlarged, symmetric, no tenderness/mass/nodules Lungs: clear to auscultation bilaterally Heart: irregularly irregular rhythm Extremities: extremities normal, atraumatic, no cyanosis or edema  EKG atrial fibrillation with a ventricular response of 66 and septal Q waves. I personally reviewed this EKG  ASSESSMENT AND PLAN:   PVD, Rt SFA PTA 12/29/11,  Lt. PTA SFA 03/01/12  History of peripheral arterial disease status post right common iliac artery stent, left SFA stent the left SFA directional atherectomy as recently as 03/01/2012. She really denies claudication. Her most recent Dopplers performed 09/23/15 revealed a right ABI 0.7 to the left 0.65. There were no obvious high-frequency signals.  HTN, LVH, Nl LVF May 2010 History of hypertension blood pressure measured at 161/80. She is on hydralazine, hydrochlorothiazide, lisinopril and metoprolol. Continue current meds at current dosing  Dyslipidemia History of dyslipidemia not on statin therapy followed by her PCP  Atrial fibrillation [facility]) The patient was found to be in A. fib when seen in the emergency room on 06/19/16 and begun on Xarelto . This is new for her. She was seen in the ER several weeks later because of bleeding in her foot as a result of trauma.Her CHA2DsVASc2 score was equal to 5. She does complain of some fatigue. I'm going to arrange for her to undergo outpatient DC cardioversion in the next 3-4 weeks.      Runell Gess MD FACP,FACC,FAHA, [facility] 07/22/2016 2:26 PM

## 2016-08-10 NOTE — Op Note (Signed)
Procedure: Electrical Cardioversion Indications:  Atrial Fibrillation  Procedure Details:  Consent: Risks of procedure as well as the alternatives and risks of each were explained to the (patient/caregiver).  Consent for procedure obtained.  Time Out: Verified patient identification, verified procedure, site/side was marked, verified correct patient position, special equipment/implants available, medications/allergies/relevent history reviewed, required imaging and test results available.  Performed  Patient placed on cardiac monitor, pulse oximetry, supplemental oxygen as necessary.  Sedation given: IV propofol 80 mg, Dr. Lennox Grumbles Pacer pads placed anterior and posterior chest.  Cardioverted 1 time(s).  Cardioversion with synchronized biphasic 120J shock.  Evaluation: Findings: Post procedure EKG shows: NSR Complications: None Patient did tolerate procedure well.  Time Spent Directly with the Patient:  30 minutes   Calle Schader 08/10/2016, 2:06 PM

## 2016-08-10 NOTE — Anesthesia Preprocedure Evaluation (Addendum)
Anesthesia Evaluation  Patient identified by MRN, date of birth, ID band Patient awake    Reviewed: Allergy & Precautions, NPO status , Patient's Chart, lab work & pertinent test results, reviewed documented beta blocker date and time   History of Anesthesia Complications Negative for: history of anesthetic complications  Airway Mallampati: II  TM Distance: >3 FB Neck ROM: Full    Dental  (+) Dental Advisory Given   Pulmonary Current Smoker,    breath sounds clear to auscultation       Cardiovascular hypertension, Pt. on medications and Pt. on home beta blockers + Peripheral Vascular Disease  + dysrhythmias  Rhythm:Irregular     Neuro/Psych negative neurological ROS  negative psych ROS   GI/Hepatic Neg liver ROS, GERD  Medicated,  Endo/Other  negative endocrine ROS  Renal/GU Renal disease  negative genitourinary   Musculoskeletal  (+) Arthritis , Osteoarthritis,    Abdominal   Peds negative pediatric ROS (+)  Hematology negative hematology ROS (+)   Anesthesia Other Findings   Reproductive/Obstetrics negative OB ROS                            Lab Results  Component Value Date   WBC 11.8 (H) 08/03/2016   HGB 13.0 08/03/2016   HCT 40.2 08/03/2016   MCV 82.7 08/03/2016   PLT 357 08/03/2016   Lab Results  Component Value Date   INR 1.1 08/03/2016   EKG: atrial fibrillation.  Anesthesia Physical Anesthesia Plan  ASA: III  Anesthesia Plan: General   Post-op Pain Management:    Induction: Intravenous  Airway Management Planned: Natural Airway and Mask  Additional Equipment: None  Intra-op Plan:   Post-operative Plan:   Informed Consent: I have reviewed the patients History and Physical, chart, labs and discussed the procedure including the risks, benefits and alternatives for the proposed anesthesia with the patient or authorized representative who has indicated his/her  understanding and acceptance.   Dental advisory given  Plan Discussed with: CRNA and Surgeon  Anesthesia Plan Comments:        Anesthesia Quick Evaluation

## 2016-08-10 NOTE — Anesthesia Procedure Notes (Signed)
Procedure Name: MAC Date/Time: 08/10/2016 2:00 PM Performed by: Kyung Rudd Pre-anesthesia Checklist: Patient identified, Emergency Drugs available, Suction available, Patient being monitored and Timeout performed Patient Re-evaluated:Patient Re-evaluated prior to inductionOxygen Delivery Method: Ambu bag Preoxygenation: Pre-oxygenation with 100% oxygen Intubation Type: IV induction Ventilation: Mask ventilation without difficulty Dental Injury: Teeth and Oropharynx as per pre-operative assessment

## 2016-08-10 NOTE — Progress Notes (Signed)
Pt requesting stitch in top of L foot be removed.  Placed by ED in 12/17.  Sw Dr. Recardo Evangelist, vo received to remove stitch.  Obtained kit from [employer].  Stitch removed without difficulty.  Stitch site intact.

## 2016-08-10 NOTE — Anesthesia Postprocedure Evaluation (Addendum)
Anesthesia Post Note  Patient: Christy Campbell  Procedure(s) Performed: Procedure(s) (LRB): CARDIOVERSION (N/A)  Patient location during evaluation: Endoscopy Anesthesia Type: General Level of consciousness: awake and alert Pain management: pain level controlled Vital Signs Assessment: post-procedure vital signs reviewed and stable Respiratory status: spontaneous breathing, nonlabored ventilation, respiratory function stable and patient connected to nasal cannula oxygen Cardiovascular status: blood pressure returned to baseline and stable Postop Assessment: no signs of nausea or vomiting Anesthetic complications: no       Last Vitals:  Vitals:   08/10/16 1420 08/10/16 1430  BP: (!) 137/48 (!) 159/94  Pulse:    Resp:    Temp:      Last Pain:  Vitals:   08/10/16 1410  TempSrc: Oral                 [name]

## 2016-08-11 ENCOUNTER — Encounter (HOSPITAL_COMMUNITY): Payer: Self-pay | Admitting: Cardiovascular Disease

## 2016-08-28 ENCOUNTER — Encounter: Payer: Self-pay | Admitting: Cardiovascular Disease

## 2016-08-28 ENCOUNTER — Ambulatory Visit (INDEPENDENT_AMBULATORY_CARE_PROVIDER_SITE_OTHER): Payer: Medicare HMO | Admitting: Cardiovascular Disease

## 2016-08-28 VITALS — BP 148/74 | HR 58 | Ht 66.0 in | Wt 189.0 lb

## 2016-08-28 DIAGNOSIS — I739 Peripheral vascular disease, unspecified: Secondary | ICD-10-CM

## 2016-08-28 DIAGNOSIS — F17200 Nicotine dependence, unspecified, uncomplicated: Secondary | ICD-10-CM

## 2016-08-28 DIAGNOSIS — I4819 Other persistent atrial fibrillation: Secondary | ICD-10-CM

## 2016-08-28 DIAGNOSIS — I481 Persistent atrial fibrillation: Secondary | ICD-10-CM

## 2016-08-28 DIAGNOSIS — R0989 Other specified symptoms and signs involving the circulatory and respiratory systems: Secondary | ICD-10-CM | POA: Diagnosis not present

## 2016-08-28 DIAGNOSIS — E785 Hyperlipidemia, unspecified: Secondary | ICD-10-CM | POA: Diagnosis not present

## 2016-08-28 MED ORDER — RIVAROXABAN 20 MG PO TABS: 20.0000 mg | ORAL_TABLET | Freq: Every day | ORAL | 3 refills | Status: DC

## 2016-08-28 NOTE — Progress Notes (Signed)
08/28/2016 Christy Campbell   Jan 17, 1941  [id number]  Primary Physician [facility] Primary Cardiologist: Runell Gess MD Nicholes [place], [place]  HPI:  The patient is a very pleasant, 76 year old, mild to moderately overweight, married Caucasian female, mother of 2, grandmother to 3 grandchildren who I saw 07/22/16. She has a history of continued tobacco abuse 5 to 6 cigarettes a day, treated hypertension and dyslipidemia. 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 Her most recent Dopplers performed 10/16/13 revealed ABIs of close to 1 bilaterally with a moderately-sized signal in the left external iliac artery. She denies claudication. She was hospitalized for nephrolithiasis at the end of last year and had an episode of hypotension and chest pain. As result of this a Myoview stress test performed in our office 08/14/14 revealed mild anteroapical ischemia. She has no symptoms. I think this is low risk. Since I saw her back a year ago she's remained currently stable. She denies chest pain, shortness of breath or claudication. She has complained of some fatigue. She was seen in the emergency room 06/19/16 with new onset A. fib. She was started on Xarelto. Her CHA2DsVAS2c score was equal to 5. She underwent successful elective outpatient DC cardioversion by Dr. Royann Shivers on 08/10/16 to sinus rhythm. Today she is back in atrial fibrillation but is asymptomatic.   Current Outpatient Prescriptions  Medication Sig Dispense Refill  . acetaminophen (TYLENOL) 325 MG tablet Take 325 mg by mouth 2 (two) times daily as needed. For pain    . diltiazem (DILACOR XR) 240 MG 24 hr capsule Take 360 mg by mouth daily.     . Garlic 10 MG CAPS Take 1 capsule by mouth daily.     . hydrALAZINE (APRESOLINE) 10  MG tablet Take 1 tablet by mouth 3 (three) times daily.    . hydrochlorothiazide (HYDRODIURIL) 25 MG tablet Take 25 mg by mouth daily.    Marland Kitchen lisinopril (PRINIVIL,ZESTRIL) 40 MG tablet Take 40 mg by mouth daily.    . metoprolol tartrate (LOPRESSOR) 25 MG tablet Take 25 mg by mouth 2 (two) times daily.     . Multiple Vitamin (MULITIVITAMIN WITH MINERALS) TABS Take 1 tablet by mouth daily.    Marland Kitchen omega-3 acid ethyl esters (LOVAZA) 1 G capsule Take 1 g by mouth daily.     . rivaroxaban (XARELTO) 20 MG TABS tablet Take 1 tablet (20 mg total) by mouth daily with supper. 30 tablet 5  . vitamin C (ASCORBIC ACID) 500 MG tablet Take 500 mg by mouth daily.     No current facility-administered medications for this visit.     Allergies  Allergen Reactions  . Aspirin Nausea And Vomiting and Other (See Comments)    "pain; hurting"  . Tetanus Toxoids Anaphylaxis and Swelling  . Pravastatin     myalgia    Social History   Social History  . Marital status: Married    Spouse name: N/A  . Number of children: N/A  . Years of education: N/A   Occupational History  . Not on file.   Social History Main Topics  . Smoking status: Current Some Day Smoker    Packs/day: 0.50    Years: 10.00    Types: Cigarettes  . Smokeless tobacco: Never Used  . Alcohol use No  .  Drug use: No  . Sexual activity: Yes   Other Topics Concern  . Not on file   Social History Narrative  . No narrative on file     Review of Systems: General: negative for chills, fever, night sweats or weight changes.  Cardiovascular: negative for chest pain, dyspnea on exertion, edema, orthopnea, palpitations, paroxysmal nocturnal dyspnea or shortness of breath Dermatological: negative for rash Respiratory: negative for cough or wheezing Urologic: negative for hematuria Abdominal: negative for nausea, vomiting, diarrhea, bright red blood per rectum, melena, or hematemesis Neurologic: negative for visual changes, syncope, or  dizziness All other systems reviewed and are otherwise negative except as noted above.    Blood pressure (!) 148/74, pulse (!) 58, height 5\' 6"  (1.676 m), weight 189 lb (85.7 kg).  General appearance: alert and no distress Neck: no adenopathy, no carotid bruit, no JVD, supple, symmetrical, trachea midline and thyroid not enlarged, symmetric, no tenderness/mass/nodules Lungs: clear to auscultation bilaterally Heart: irregularly irregular rhythm Extremities: extremities normal, atraumatic, no cyanosis or edema  EKG initial fibrillation with a ventricular response of 58 and nonspecific ST-T wave changes. There were septal Q waves. I personally reviewed this EKG  ASSESSMENT AND PLAN:   PVD, Rt SFA PTA 12/29/11,  Lt. PTA SFA 03/01/12  History of peripheral vascular disease status post multiple procedures on her lower extremities including right common iliac stent in the left SFA stent left SFA directional atherectomy as recently as 03/01/12. She really denies claudication. Her most recent lower extremity arterial Doppler studies performed/6/17 revealed right ABI 0.7 to moderately elevated signal in the right external iliac artery and a right ABI 0.65. We will repeat lower extremity artery Doppler studies in March of this year  HTN, LVH, Nl LVF May 2010 History of hypertension blood pressure measured today at 148/74. She says she measures her blood pressure at home and is much lower than this. She is on diltiazem, hydrochlorothiazide, lisinopril, metoprolol and hydralazine. Continue current meds at current dosing  Dyslipidemia History of hyperlipidemia on Lovaza followed by her PCP  Smoker History of ongoing tobacco abuse recalcitrant risk factor modification.  Atrial fibrillation, persistent (HCC) History of persistent atrial fibrillation status post successful DC cardioversion by Dr. Royann Shivers 08/10/16. She is rate controlled on Xarelto oral anticoagulation. Today she is back in atrial  fibrillation with a heart rate of 58. I'm going to keep her in A. fib rate controlled on oral anticoagulation.      Runell Gess MD FACP,FACC,FAHA, [facility] 08/28/2016 3:20 PM

## 2016-08-28 NOTE — Assessment & Plan Note (Signed)
History of hypertension blood pressure measured today at 148/74. She says she measures her blood pressure at home and is much lower than this. She is on diltiazem, hydrochlorothiazide, lisinopril, metoprolol and hydralazine. Continue current meds at current dosing

## 2016-08-28 NOTE — Addendum Note (Signed)
Addended by: Evans Lance on: 08/28/2016 03:27 PM   Modules accepted: Orders

## 2016-08-28 NOTE — Assessment & Plan Note (Signed)
History of peripheral vascular disease status post multiple procedures on her lower extremities including right common iliac stent in the left SFA stent left SFA directional atherectomy as recently as 03/01/12. She really denies claudication. Her most recent lower extremity arterial Doppler studies performed/6/17 revealed right ABI 0.7 to moderately elevated signal in the right external iliac artery and a right ABI 0.65. We will repeat lower extremity artery Doppler studies in March of this year

## 2016-08-28 NOTE — Patient Instructions (Signed)
Medication Instructions: Your physician recommends that you continue on your current medications as directed. Please refer to the Current Medication list given to you today.  Testing/Procedures: Your physician has requested that you have a carotid duplex. This test is an ultrasound of the carotid arteries in your neck. It looks at blood flow through these arteries that supply the brain with blood. Allow one hour for this exam. There are no restrictions or special instructions.  Schedule LEA/ABI for March 2018  Follow-Up: We request that you follow-up in: 6 months with an extender and in 12 months with Dr San Morelle will receive a reminder letter in the mail two months in advance. If you don't receive a letter, please call our office to schedule the follow-up appointment.  If you need a refill on your cardiac medications before your next appointment, please call your pharmacy.

## 2016-08-28 NOTE — Assessment & Plan Note (Signed)
History of ongoing tobacco abuse recalcitrant risk factor modification 

## 2016-08-28 NOTE — Assessment & Plan Note (Signed)
History of hyperlipidemia on Lovaza followed by her PCP

## 2016-08-28 NOTE — Assessment & Plan Note (Signed)
History of persistent atrial fibrillation status post successful DC cardioversion by Dr. Royann Shivers 08/10/16. She is rate controlled on Xarelto oral anticoagulation. Today she is back in atrial fibrillation with a heart rate of 58. I'm going to keep her in A. fib rate controlled on oral anticoagulation.

## 2016-09-01 ENCOUNTER — Ambulatory Visit: Payer: Medicare HMO | Admitting: Cardiovascular Disease

## 2016-09-01 NOTE — Addendum Note (Signed)
Addended by: Chana Bode on: 09/01/2016 09:48 AM   Modules accepted: Orders

## 2016-09-09 ENCOUNTER — Ambulatory Visit (HOSPITAL_COMMUNITY)
Admission: RE | Admit: 2016-09-09 | Discharge: 2016-09-09 | Disposition: A | Discharge status: Home / Self Care | Payer: Medicare HMO | Source: Ambulatory Visit | Attending: Cardiology | Admitting: Cardiology

## 2016-09-09 DIAGNOSIS — I6523 Occlusion and stenosis of bilateral carotid arteries: Secondary | ICD-10-CM | POA: Insufficient documentation

## 2016-09-09 DIAGNOSIS — R0989 Other specified symptoms and signs involving the circulatory and respiratory systems: Secondary | ICD-10-CM

## 2016-09-11 ENCOUNTER — Other Ambulatory Visit: Payer: Self-pay | Admitting: Cardiovascular Disease

## 2016-09-11 DIAGNOSIS — I6522 Occlusion and stenosis of left carotid artery: Secondary | ICD-10-CM

## 2016-09-15 ENCOUNTER — Other Ambulatory Visit: Payer: Self-pay | Admitting: Cardiovascular Disease

## 2016-09-15 DIAGNOSIS — I739 Peripheral vascular disease, unspecified: Secondary | ICD-10-CM

## 2016-09-21 ENCOUNTER — Telehealth: Payer: Self-pay | Admitting: Cardiovascular Disease

## 2016-09-21 NOTE — Telephone Encounter (Signed)
Requesting surgical clearance:  1. Type of surgery: Open Reduction Internal Fixation Left Distal Radius, Left Carpal Tunnel Release --Anesthesia: General and Axillary Block  2. Surgeon: Dr. Dairl Ponder  3.Surgical Date:  09/24/26  4. Medications that need to be held: Xarelto   5. CAD: No  6. I will defer to:  Dr. Conception Oms Information:   [facility] Phone:  [phone]  Fax:  [phone]

## 2016-09-22 NOTE — Telephone Encounter (Signed)
Clearance routed to number provided via EPIC. 

## 2016-09-22 NOTE — Telephone Encounter (Signed)
Okay to stop Xarelto  3 days prior to orthopedic surgery

## 2016-09-23 ENCOUNTER — Encounter: Payer: Self-pay | Admitting: Cardiovascular Disease

## 2016-09-23 NOTE — Telephone Encounter (Signed)
Faxed Clearance letter this morning to [phone].

## 2016-10-05 ENCOUNTER — Inpatient Hospital Stay (HOSPITAL_COMMUNITY): Admission: RE | Admit: 2016-10-05 | Payer: Medicare HMO | Source: Ambulatory Visit

## 2016-11-09 ENCOUNTER — Encounter (HOSPITAL_COMMUNITY): Payer: Medicare HMO

## 2016-12-02 ENCOUNTER — Ambulatory Visit (HOSPITAL_COMMUNITY)
Admission: RE | Admit: 2016-12-02 | Discharge: 2016-12-02 | Disposition: A | Discharge status: Home / Self Care | Payer: Medicare HMO | Source: Ambulatory Visit | Attending: Cardiovascular Disease | Admitting: Cardiovascular Disease

## 2016-12-02 DIAGNOSIS — I739 Peripheral vascular disease, unspecified: Secondary | ICD-10-CM

## 2016-12-02 DIAGNOSIS — E785 Hyperlipidemia, unspecified: Secondary | ICD-10-CM | POA: Diagnosis not present

## 2016-12-02 DIAGNOSIS — I10 Essential (primary) hypertension: Secondary | ICD-10-CM | POA: Insufficient documentation

## 2016-12-02 DIAGNOSIS — Z87898 Personal history of other specified conditions: Secondary | ICD-10-CM | POA: Insufficient documentation

## 2016-12-21 NOTE — Addendum Note (Signed)
Addendum  created 12/21/16 1016 by Bayley Hurn, MD   Sign clinical note    

## 2016-12-23 ENCOUNTER — Ambulatory Visit (INDEPENDENT_AMBULATORY_CARE_PROVIDER_SITE_OTHER): Payer: Medicare HMO | Admitting: Cardiovascular Disease

## 2016-12-23 ENCOUNTER — Encounter: Payer: Self-pay | Admitting: Cardiovascular Disease

## 2016-12-23 VITALS — BP 140/83 | HR 80 | Ht 66.0 in | Wt 190.0 lb

## 2016-12-23 DIAGNOSIS — I739 Peripheral vascular disease, unspecified: Secondary | ICD-10-CM

## 2016-12-23 NOTE — Assessment & Plan Note (Signed)
Ms. Christy Campbell comes back in for follow-up of her lower extremity Dopplers recently performed 12/02/16. Her ABIs have remained stable although she has a new mid right SFA stenosis and appears physiologically significant. She denies lifestyle limiting claudication. We will continue to monitor her Dopplers on a semiannual basis. 

## 2016-12-23 NOTE — Progress Notes (Signed)
Ms. Christy Campbell comes back in for follow-up of her lower extremity Dopplers recently performed 12/02/16. Her ABIs have remained stable although she has a new mid right SFA stenosis and appears physiologically significant. She denies lifestyle limiting claudication. We will continue to monitor her Dopplers on a semiannual basis.

## 2016-12-23 NOTE — Patient Instructions (Signed)
Medication Instructions: Your physician recommends that you continue on your current medications as directed. Please refer to the Current Medication list given to you today.   Testing/Procedures: Your physician has requested that you have a lower extremity arterial duplex. During this test, ultrasound is used to evaluate arterial blood flow in the legs. Allow one hour for this exam. There are no restrictions or special instructions.  Your physician has requested that you have an ankle brachial index (ABI). During this test an ultrasound and blood pressure cuff are used to evaluate the arteries that supply the arms and legs with blood. Allow thirty minutes for this exam. There are no restrictions or special instructions.  Follow-Up: Your physician wants you to follow-up in: 1 year with Dr. Berry. You will receive a reminder letter in the mail two months in advance. If you don't receive a letter, please call our office to schedule the follow-up appointment.  If you need a refill on your cardiac medications before your next appointment, please call your pharmacy.  

## 2017-01-17 DIAGNOSIS — I219 Acute myocardial infarction, unspecified: Secondary | ICD-10-CM

## 2017-01-17 HISTORY — DX: Acute myocardial infarction, unspecified: I21.9

## 2017-01-22 ENCOUNTER — Other Ambulatory Visit: Payer: Self-pay | Admitting: Pharmacist

## 2017-01-22 MED ORDER — RIVAROXABAN 20 MG PO TABS: 20.0000 mg | ORAL_TABLET | Freq: Every day | ORAL | 3 refills | Status: DC

## 2017-01-30 ENCOUNTER — Encounter (HOSPITAL_COMMUNITY)
Admission: EM | Disposition: A | Discharge status: Home / Self Care | Payer: Self-pay | Source: Home / Self Care | Attending: Cardiology

## 2017-01-30 ENCOUNTER — Encounter (HOSPITAL_COMMUNITY): Payer: Self-pay

## 2017-01-30 ENCOUNTER — Emergency Department (HOSPITAL_COMMUNITY): Payer: Medicare HMO

## 2017-01-30 ENCOUNTER — Inpatient Hospital Stay (HOSPITAL_COMMUNITY)
Admission: EM | Admit: 2017-01-30 | Discharge: 2017-02-04 | DRG: 249 | Disposition: A | Discharge status: Home / Self Care | Payer: Medicare HMO | Attending: Cardiology | Admitting: Cardiology

## 2017-01-30 DIAGNOSIS — I2119 ST elevation (STEMI) myocardial infarction involving other coronary artery of inferior wall: Secondary | ICD-10-CM | POA: Diagnosis present

## 2017-01-30 DIAGNOSIS — I259 Chronic ischemic heart disease, unspecified: Secondary | ICD-10-CM | POA: Diagnosis present

## 2017-01-30 DIAGNOSIS — I481 Persistent atrial fibrillation: Secondary | ICD-10-CM | POA: Diagnosis present

## 2017-01-30 DIAGNOSIS — I219 Acute myocardial infarction, unspecified: Secondary | ICD-10-CM | POA: Diagnosis not present

## 2017-01-30 DIAGNOSIS — E876 Hypokalemia: Secondary | ICD-10-CM | POA: Diagnosis present

## 2017-01-30 DIAGNOSIS — I2510 Atherosclerotic heart disease of native coronary artery without angina pectoris: Secondary | ICD-10-CM | POA: Diagnosis present

## 2017-01-30 DIAGNOSIS — I340 Nonrheumatic mitral (valve) insufficiency: Secondary | ICD-10-CM | POA: Diagnosis not present

## 2017-01-30 DIAGNOSIS — F17210 Nicotine dependence, cigarettes, uncomplicated: Secondary | ICD-10-CM | POA: Diagnosis present

## 2017-01-30 DIAGNOSIS — Z95820 Peripheral vascular angioplasty status with implants and grafts: Secondary | ICD-10-CM | POA: Diagnosis not present

## 2017-01-30 DIAGNOSIS — R112 Nausea with vomiting, unspecified: Secondary | ICD-10-CM | POA: Diagnosis present

## 2017-01-30 DIAGNOSIS — Z888 Allergy status to other drugs, medicaments and biological substances status: Secondary | ICD-10-CM

## 2017-01-30 DIAGNOSIS — I071 Rheumatic tricuspid insufficiency: Secondary | ICD-10-CM | POA: Diagnosis present

## 2017-01-30 DIAGNOSIS — I482 Chronic atrial fibrillation: Secondary | ICD-10-CM | POA: Diagnosis present

## 2017-01-30 DIAGNOSIS — Z887 Allergy status to serum and vaccine status: Secondary | ICD-10-CM | POA: Diagnosis not present

## 2017-01-30 DIAGNOSIS — M479 Spondylosis, unspecified: Secondary | ICD-10-CM | POA: Diagnosis present

## 2017-01-30 DIAGNOSIS — I2111 ST elevation (STEMI) myocardial infarction involving right coronary artery: Secondary | ICD-10-CM

## 2017-01-30 DIAGNOSIS — I272 Pulmonary hypertension, unspecified: Secondary | ICD-10-CM | POA: Diagnosis present

## 2017-01-30 DIAGNOSIS — Z7901 Long term (current) use of anticoagulants: Secondary | ICD-10-CM

## 2017-01-30 DIAGNOSIS — Z8711 Personal history of peptic ulcer disease: Secondary | ICD-10-CM | POA: Diagnosis not present

## 2017-01-30 DIAGNOSIS — M1710 Unilateral primary osteoarthritis, unspecified knee: Secondary | ICD-10-CM | POA: Diagnosis present

## 2017-01-30 DIAGNOSIS — G2581 Restless legs syndrome: Secondary | ICD-10-CM | POA: Diagnosis present

## 2017-01-30 DIAGNOSIS — I10 Essential (primary) hypertension: Secondary | ICD-10-CM | POA: Diagnosis present

## 2017-01-30 DIAGNOSIS — Z7982 Long term (current) use of aspirin: Secondary | ICD-10-CM

## 2017-01-30 DIAGNOSIS — E785 Hyperlipidemia, unspecified: Secondary | ICD-10-CM | POA: Diagnosis present

## 2017-01-30 DIAGNOSIS — R1013 Epigastric pain: Secondary | ICD-10-CM | POA: Diagnosis present

## 2017-01-30 DIAGNOSIS — I739 Peripheral vascular disease, unspecified: Secondary | ICD-10-CM | POA: Diagnosis present

## 2017-01-30 DIAGNOSIS — Z79899 Other long term (current) drug therapy: Secondary | ICD-10-CM | POA: Diagnosis not present

## 2017-01-30 DIAGNOSIS — R072 Precordial pain: Secondary | ICD-10-CM | POA: Diagnosis not present

## 2017-01-30 DIAGNOSIS — M19079 Primary osteoarthritis, unspecified ankle and foot: Secondary | ICD-10-CM | POA: Diagnosis present

## 2017-01-30 DIAGNOSIS — J9621 Acute and chronic respiratory failure with hypoxia: Secondary | ICD-10-CM | POA: Diagnosis not present

## 2017-01-30 HISTORY — PX: CORONARY/GRAFT ACUTE MI REVASCULARIZATION: CATH118305

## 2017-01-30 HISTORY — PX: LEFT HEART CATH AND CORONARY ANGIOGRAPHY: CATH118249

## 2017-01-30 LAB — TROPONIN I: Troponin I: 5.54 ng/mL (ref <0.03)

## 2017-01-30 LAB — CBC
HEMATOCRIT: 42.3 % (ref 36.0–46.0)
Hemoglobin: 14.1 g/dL (ref 12.0–15.0)
MCH: 27.6 pg (ref 26.0–34.0)
MCHC: 33.3 g/dL (ref 30.0–36.0)
MCV: 82.8 fL (ref 78.0–100.0)
PLATELETS: 315 10*3/uL (ref 150–400)
RBC: 5.11 MIL/uL (ref 3.87–5.11)
RDW: 16.1 % — AB (ref 11.5–15.5)
WBC: 15.5 10*3/uL — ABNORMAL HIGH (ref 4.0–10.5)

## 2017-01-30 LAB — URINALYSIS, ROUTINE W REFLEX MICROSCOPIC
BACTERIA UA: NONE SEEN
BILIRUBIN URINE: NEGATIVE
Glucose, UA: NEGATIVE mg/dL
Ketones, ur: NEGATIVE mg/dL
Leukocytes, UA: NEGATIVE
Nitrite: NEGATIVE
PROTEIN: 100 mg/dL — AB
SPECIFIC GRAVITY, URINE: 1.023 (ref 1.005–1.030)
pH: 6 (ref 5.0–8.0)

## 2017-01-30 LAB — COMPREHENSIVE METABOLIC PANEL
ALBUMIN: 3.8 g/dL (ref 3.5–5.0)
ALT: 87 U/L — ABNORMAL HIGH (ref 14–54)
AST: 122 U/L — AB (ref 15–41)
Alkaline Phosphatase: 106 U/L (ref 38–126)
Anion gap: 10 (ref 5–15)
BUN: 28 mg/dL — AB (ref 6–20)
CO2: 22 mmol/L (ref 22–32)
CREATININE: 1.23 mg/dL — AB (ref 0.44–1.00)
Calcium: 9.6 mg/dL (ref 8.9–10.3)
Chloride: 105 mmol/L (ref 101–111)
GFR calc Af Amer: 48 mL/min — ABNORMAL LOW (ref >60)
GFR calc non Af Amer: 42 mL/min — ABNORMAL LOW (ref >60)
Glucose, Bld: 145 mg/dL — ABNORMAL HIGH (ref 65–99)
Potassium: 4.4 mmol/L (ref 3.5–5.1)
SODIUM: 137 mmol/L (ref 135–145)
Total Bilirubin: 0.8 mg/dL (ref 0.3–1.2)
Total Protein: 6.2 g/dL — ABNORMAL LOW (ref 6.5–8.1)

## 2017-01-30 LAB — LIPID PANEL
CHOLESTEROL: 142 mg/dL (ref 0–200)
HDL: 41 mg/dL (ref >40)
LDL CALC: 85 mg/dL (ref 0–99)
TRIGLYCERIDES: 80 mg/dL (ref <150)
Total CHOL/HDL Ratio: 3.5 RATIO
VLDL: 16 mg/dL (ref 0–40)

## 2017-01-30 LAB — I-STAT TROPONIN, ED: TROPONIN I, POC: 3.2 ng/mL — AB (ref 0.00–0.08)

## 2017-01-30 LAB — LIPASE, BLOOD: LIPASE: 23 U/L (ref 11–51)

## 2017-01-30 LAB — APTT: APTT: 34 s (ref 24–36)

## 2017-01-30 LAB — PROTIME-INR
INR: 1.35
Prothrombin Time: 16.7 seconds — ABNORMAL HIGH (ref 11.4–15.2)

## 2017-01-30 SURGERY — LEFT HEART CATH AND CORONARY ANGIOGRAPHY
Anesthesia: LOCAL

## 2017-01-30 MED ORDER — ASPIRIN 81 MG PO CHEW
324.0000 mg | CHEWABLE_TABLET | Freq: Once | ORAL | Status: AC
  Administered 2017-01-30: 324 mg via ORAL

## 2017-01-30 MED ORDER — ATROPINE SULFATE 1 MG/10ML IJ SOSY
PREFILLED_SYRINGE | INTRAMUSCULAR | Status: AC
  Filled 2017-01-30: qty 10

## 2017-01-30 MED ORDER — ASPIRIN 81 MG PO CHEW
324.0000 mg | CHEWABLE_TABLET | Freq: Once | ORAL | Status: AC
  Administered 2017-01-30: 324 mg via ORAL
  Filled 2017-01-30: qty 4

## 2017-01-30 MED ORDER — ONDANSETRON HCL 4 MG/2ML IJ SOLN
4.0000 mg | Freq: Once | INTRAMUSCULAR | Status: AC
  Administered 2017-01-30: 4 mg via INTRAVENOUS

## 2017-01-30 MED ORDER — LIDOCAINE HCL (PF) 1 % IJ SOLN
INTRAMUSCULAR | Status: DC | PRN
  Administered 2017-01-30: 2 mL

## 2017-01-30 MED ORDER — HEPARIN (PORCINE) IN NACL 100-0.45 UNIT/ML-% IJ SOLN
1200.0000 [IU]/h | INTRAMUSCULAR | Status: DC
  Administered 2017-01-31: 1200 [IU]/h via INTRAVENOUS
  Filled 2017-01-30: qty 250

## 2017-01-30 MED ORDER — VERAPAMIL HCL 2.5 MG/ML IV SOLN
INTRAVENOUS | Status: AC
  Filled 2017-01-30: qty 2

## 2017-01-30 MED ORDER — HEPARIN (PORCINE) IN NACL 2-0.9 UNIT/ML-% IJ SOLN
INTRAMUSCULAR | Status: AC
  Filled 2017-01-30: qty 500

## 2017-01-30 MED ORDER — ONDANSETRON 4 MG PO TBDP
ORAL_TABLET | ORAL | Status: AC
  Administered 2017-01-30: 19:00:00
  Filled 2017-01-30: qty 1

## 2017-01-30 MED ORDER — TICAGRELOR 90 MG PO TABS
ORAL_TABLET | ORAL | Status: DC | PRN
  Administered 2017-01-30 (×2): 180 mg via ORAL

## 2017-01-30 MED ORDER — IOPAMIDOL (ISOVUE-370) INJECTION 76%
INTRAVENOUS | Status: DC | PRN
  Administered 2017-01-30: 140 mL via INTRA_ARTERIAL

## 2017-01-30 MED ORDER — SODIUM CHLORIDE 0.9 % IV SOLN: 250.0000 mL | INTRAVENOUS | Status: DC | PRN

## 2017-01-30 MED ORDER — SODIUM CHLORIDE 0.9 % IV BOLUS (SEPSIS)
1000.0000 mL | Freq: Once | INTRAVENOUS | Status: AC
  Administered 2017-01-30: 1000 mL via INTRAVENOUS

## 2017-01-30 MED ORDER — TICAGRELOR 90 MG PO TABS
ORAL_TABLET | ORAL | Status: AC
  Filled 2017-01-30: qty 2

## 2017-01-30 MED ORDER — SODIUM CHLORIDE 0.9 % IV SOLN
INTRAVENOUS | Status: DC
  Administered 2017-01-30: 20 mL/h via INTRAVENOUS

## 2017-01-30 MED ORDER — SODIUM CHLORIDE 0.9% FLUSH: 3.0000 mL | INTRAVENOUS | Status: DC | PRN

## 2017-01-30 MED ORDER — LIDOCAINE HCL (PF) 1 % IJ SOLN
INTRAMUSCULAR | Status: AC
  Filled 2017-01-30: qty 30

## 2017-01-30 MED ORDER — VERAPAMIL HCL 2.5 MG/ML IV SOLN
INTRAVENOUS | Status: DC | PRN
  Administered 2017-01-30: 10 mL via INTRA_ARTERIAL

## 2017-01-30 MED ORDER — SODIUM CHLORIDE 0.9 % WEIGHT BASED INFUSION
1.0000 mL/kg/h | INTRAVENOUS | Status: AC
  Administered 2017-01-30: 1 mL/kg/h via INTRAVENOUS

## 2017-01-30 MED ORDER — ONDANSETRON HCL 4 MG/2ML IJ SOLN
4.0000 mg | Freq: Four times a day (QID) | INTRAMUSCULAR | Status: DC | PRN
Start: 2017-01-30 — End: 2017-02-04
  Administered 2017-01-31 (×2): 4 mg via INTRAVENOUS
  Filled 2017-01-30 (×4): qty 2

## 2017-01-30 MED ORDER — ONDANSETRON HCL 4 MG/2ML IJ SOLN
INTRAMUSCULAR | Status: AC
  Filled 2017-01-30: qty 2

## 2017-01-30 MED ORDER — TICAGRELOR 90 MG PO TABS
90.0000 mg | ORAL_TABLET | Freq: Two times a day (BID) | ORAL | Status: DC
  Administered 2017-01-31 (×2): 90 mg via ORAL
  Filled 2017-01-30 (×2): qty 1

## 2017-01-30 MED ORDER — SODIUM CHLORIDE 0.9% FLUSH
3.0000 mL | Freq: Two times a day (BID) | INTRAVENOUS | Status: DC
  Administered 2017-01-31 – 2017-02-04 (×6): 3 mL via INTRAVENOUS

## 2017-01-30 MED ORDER — IOPAMIDOL (ISOVUE-370) INJECTION 76%
INTRAVENOUS | Status: AC
  Filled 2017-01-30: qty 50

## 2017-01-30 MED ORDER — HEPARIN (PORCINE) IN NACL 2-0.9 UNIT/ML-% IJ SOLN
INTRAMUSCULAR | Status: AC | PRN
  Administered 2017-01-30 (×2): 1000 mL

## 2017-01-30 MED ORDER — HEPARIN (PORCINE) IN NACL 2-0.9 UNIT/ML-% IJ SOLN
INTRAMUSCULAR | Status: AC
  Filled 2017-01-30: qty 1000

## 2017-01-30 MED ORDER — NITROGLYCERIN 0.4 MG SL SUBL: 0.4000 mg | SUBLINGUAL_TABLET | SUBLINGUAL | Status: DC | PRN

## 2017-01-30 MED ORDER — OMEGA-3-ACID ETHYL ESTERS 1 G PO CAPS
1.0000 g | ORAL_CAPSULE | Freq: Every day | ORAL | Status: DC
  Administered 2017-01-31 – 2017-02-04 (×5): 1 g via ORAL
  Filled 2017-01-30 (×5): qty 1

## 2017-01-30 MED ORDER — ATROPINE SULFATE 1 MG/10ML IJ SOSY
PREFILLED_SYRINGE | INTRAMUSCULAR | Status: DC | PRN
  Administered 2017-01-30: 1 mg via INTRAVENOUS

## 2017-01-30 MED ORDER — ASPIRIN 300 MG RE SUPP: 300.0000 mg | RECTAL | Status: AC

## 2017-01-30 MED ORDER — HEPARIN SODIUM (PORCINE) 1000 UNIT/ML IJ SOLN
INTRAMUSCULAR | Status: AC
  Filled 2017-01-30: qty 1

## 2017-01-30 MED ORDER — HEPARIN SODIUM (PORCINE) 5000 UNIT/ML IJ SOLN
4000.0000 [IU] | Freq: Once | INTRAMUSCULAR | Status: AC
  Administered 2017-01-30: 4000 [IU] via INTRAVENOUS
  Filled 2017-01-30: qty 1

## 2017-01-30 MED ORDER — ASPIRIN EC 81 MG PO TBEC
81.0000 mg | DELAYED_RELEASE_TABLET | Freq: Every day | ORAL | Status: DC
  Administered 2017-01-31 – 2017-02-04 (×5): 81 mg via ORAL
  Filled 2017-01-30 (×5): qty 1

## 2017-01-30 MED ORDER — ONDANSETRON 4 MG PO TBDP
4.0000 mg | ORAL_TABLET | Freq: Once | ORAL | Status: AC | PRN
  Administered 2017-01-30: 4 mg via ORAL

## 2017-01-30 MED ORDER — ATORVASTATIN CALCIUM 80 MG PO TABS
80.0000 mg | ORAL_TABLET | Freq: Every day | ORAL | Status: DC
  Administered 2017-01-31 – 2017-02-03 (×4): 80 mg via ORAL
  Filled 2017-01-30 (×4): qty 1

## 2017-01-30 MED ORDER — ASPIRIN 81 MG PO CHEW
324.0000 mg | CHEWABLE_TABLET | ORAL | Status: AC
  Administered 2017-01-30: 324 mg via ORAL

## 2017-01-30 MED ORDER — ACETAMINOPHEN 325 MG PO TABS
325.0000 mg | ORAL_TABLET | ORAL | Status: DC | PRN
  Administered 2017-01-31 (×2): 325 mg via ORAL
  Filled 2017-01-30 (×2): qty 1

## 2017-01-30 MED ORDER — ONDANSETRON HCL 4 MG/2ML IJ SOLN
INTRAMUSCULAR | Status: DC | PRN
  Administered 2017-01-30: 4 mg via INTRAVENOUS

## 2017-01-30 MED ORDER — HEPARIN SODIUM (PORCINE) 1000 UNIT/ML IJ SOLN
INTRAMUSCULAR | Status: DC | PRN
Start: 2017-01-30 — End: 2017-01-30
  Administered 2017-01-30: 6000 [IU] via INTRAVENOUS

## 2017-01-30 MED ORDER — IOPAMIDOL (ISOVUE-370) INJECTION 76%
INTRAVENOUS | Status: AC
  Filled 2017-01-30: qty 125

## 2017-01-30 SURGICAL SUPPLY — 20 items
BALLN EMERGE MR 2.5X12 (BALLOONS) ×2
BALLN ~~LOC~~ EMERGE MR 3.0X12 (BALLOONS) ×2
BALLOON EMERGE MR 2.5X12 (BALLOONS) ×1 IMPLANT
BALLOON ~~LOC~~ EMERGE MR 3.0X12 (BALLOONS) ×1 IMPLANT
CATH INFINITI 5 FR JL3.5 (CATHETERS) ×2 IMPLANT
CATH INFINITI 5FR ANG PIGTAIL (CATHETERS) ×2 IMPLANT
CATH INFINITI JR4 5F (CATHETERS) ×2 IMPLANT
CATH LAUNCHER 6FR JR4 (CATHETERS) ×2 IMPLANT
DEVICE RAD COMP TR BAND LRG (VASCULAR PRODUCTS) ×2 IMPLANT
GLIDESHEATH SLEND SS 6F .021 (SHEATH) ×2 IMPLANT
GUIDEWIRE INQWIRE 1.5J.035X260 (WIRE) ×1 IMPLANT
INQWIRE 1.5J .035X260CM (WIRE) ×2
KIT ENCORE 26 ADVANTAGE (KITS) ×2 IMPLANT
KIT HEART LEFT (KITS) ×2 IMPLANT
PACK CARDIAC CATHETERIZATION (CUSTOM PROCEDURE TRAY) ×2 IMPLANT
STENT VISION RX 3.0X23 (Permanent Stent) ×2 IMPLANT
SYR MEDRAD MARK V 150ML (SYRINGE) ×2 IMPLANT
TRANSDUCER W/STOPCOCK (MISCELLANEOUS) ×2 IMPLANT
TUBING CIL FLEX 10 FLL-RA (TUBING) ×2 IMPLANT
WIRE ASAHI PROWATER 180CM (WIRE) ×2 IMPLANT

## 2017-01-30 NOTE — ED Notes (Signed)
Cardiology at bedside.

## 2017-01-30 NOTE — Progress Notes (Signed)
ANTICOAGULATION CONSULT NOTE - Initial Consult  Pharmacy Consult for heparin Indication: atrial fibrillation and CAD  Allergies  Allergen Reactions  . Aspirin Nausea And Vomiting and Other (See Comments)    "pain; hurting"  . Tetanus Toxoids Anaphylaxis and Swelling  . Pravastatin     myalgia    Patient Measurements: Height: 5\' 6"  (167.6 cm) Weight: 194 lb 10.7 oz (88.3 kg) IBW/kg (Calculated) : 59.3 Heparin Dosing Weight: 80kg  Vital Signs: Temp: 99 F (37.2 C) (07/14 2300) Temp Source: Axillary (07/14 2300) BP: 115/84 (07/14 2300) Pulse Rate: 213 (07/14 2240)  Labs:  Recent Labs  01/30/17 1844 01/30/17 2104  HGB 14.1  --   HCT 42.3  --   PLT 315  --   APTT  --  34  LABPROT  --  16.7*  INR  --  1.35  CREATININE 1.23*  --   TROPONINI  --  5.54*    Estimated Creatinine Clearance: 44.2 mL/min (A) (by C-G formula based on SCr of 1.23 mg/dL (H)).   Medical History: Past Medical History:  Diagnosis Date  . Anemia    "long time ago"  . Arthritis    "back; right knee and ankle"  . Dyslipidemia   . GERD (gastroesophageal reflux disease)   . H/O cardiovascular stress test 12/06/08   normal, no ischemia  . History of stomach ulcers 03/01/12   "little bitty ones"  . Hypertension    echo 12/06/08-EF>55%, mild AS and mild pulmonary htn  . Kidney stones   . PVD (peripheral vascular disease) (HCC) 12/29/2011   h/o right common iliac artery stent, left SFA stent and left SFA directional atherectomy 02/29/12  . Tobacco abuse     Medications:  Prescriptions Prior to Admission  Medication Sig Dispense Refill Last Dose  . acetaminophen (TYLENOL) 325 MG tablet Take 325 mg by mouth 2 (two) times daily as needed. For pain   01/30/2017 at Unknown time  . diltiazem (DILACOR XR) 240 MG 24 hr capsule Take 360 mg by mouth daily.    01/30/2017 at Unknown time  . Garlic 10 MG CAPS Take 1 capsule by mouth daily.    01/30/2017 at Unknown time  . hydrALAZINE (APRESOLINE) 10 MG tablet  Take 1 tablet by mouth 3 (three) times daily.   01/30/2017 at Unknown time  . hydrochlorothiazide (HYDRODIURIL) 25 MG tablet Take 25 mg by mouth daily.   01/30/2017 at Unknown time  . lisinopril (PRINIVIL,ZESTRIL) 40 MG tablet Take 40 mg by mouth daily.   01/30/2017 at Unknown time  . metoprolol tartrate (LOPRESSOR) 25 MG tablet Take 25 mg by mouth 2 (two) times daily.    01/30/2017 at Unknown time  . Multiple Vitamin (MULITIVITAMIN WITH MINERALS) TABS Take 1 tablet by mouth daily.   01/29/2017 at Unknown time  . omega-3 acid ethyl esters (LOVAZA) 1 G capsule Take 1 g by mouth daily.    01/30/2017 at Unknown time  . rivaroxaban (XARELTO) 20 MG TABS tablet Take 1 tablet (20 mg total) by mouth daily with supper. 30 tablet 3 01/29/2017 at 2000  . vitamin C (ASCORBIC ACID) 500 MG tablet Take 500 mg by mouth daily.   01/29/2017 at Unknown time   Scheduled:  . aspirin  324 mg Oral Once  . aspirin  324 mg Oral NOW   Or  . aspirin  300 mg Rectal NOW  . [START ON 01/31/2017] aspirin EC  81 mg Oral Daily  . [START ON 01/31/2017] atorvastatin  80 mg  Oral q1800  . [START ON 01/31/2017] omega-3 acid ethyl esters  1 g Oral Daily  . ondansetron (ZOFRAN) IV  4 mg Intravenous Once  . ondansetron      . [START ON 01/31/2017] sodium chloride flush  3 mL Intravenous Q12H  . [START ON 01/31/2017] ticagrelor  90 mg Oral BID   Infusions:  . sodium chloride    . [START ON 01/31/2017] sodium chloride    . sodium chloride 1 mL/kg/hr (01/30/17 2348)    Assessment: 76yo female presents as code STEMI and was brought emergently to cath lab, found w/ severe 3-vessel CAD, stented mid RCA, plan for possible CABG in 4-6wk, to begin heparin bridge 8hr after sheath removal (sheath removed 7/14 2222) w/ plan to resume Xarelto for Afib 7/15 pm if no complications; last dose of Xarelto taken 7/13 2000.  Goal of Therapy:  Heparin level 0.3-0.7 units/ml aPTT 66-102 seconds Monitor platelets by anticoagulation protocol: Yes   Plan:   At 0630 will begin heparin gtt at 1200 units/hr and monitor heparin levels, PTTs (while Xarelto affects anti-Xa), and CBC.  Vernard Gambles, PharmD, BCPS  01/30/2017,11:50 PM

## 2017-01-30 NOTE — ED Notes (Signed)
Received call from micro regarding blood cultures sent to lab without an order. Consulted Dr. Eudelia Bunch to see if cultures needed to be ordered. Per Dr. Eudelia Bunch, no blood cultures necessary. Will notify micro.

## 2017-01-30 NOTE — ED Notes (Signed)
Pt reports onset this morning not feeling well, nausea, upper abd cramping, radiating to right neck, clammy.  EKG done by this nurse and given to Dr. Eudelia Bunch.

## 2017-01-30 NOTE — ED Notes (Signed)
Radiolucent pads placed on Pt.

## 2017-01-30 NOTE — Progress Notes (Signed)
Responded to page for STEMI to ED, E 43. Pt and husband were in rm, as she was being evaluated by med staff. Provided spiritual/emotional support, ministry of presence, and prayer in b/t med ministrations. Pt thanked me, said husband was a Product/process development scientist. That appeared accurate.   Nurse helped husband call pt's daughter from number on registration data. She was in [place] and will come now, ETA sometime aft 2200. Pt's husband is pt's daughter's step-dad -- he said her dad was deceased, so she would come asap for her mom.   Accompanied husband to 2H waiting area,nurse provided him w/ [place] sandwiches for him and daughter when he said he hadn't eaten all day. Introduced him to [employer]. Advised that doctor will come there to lhk how his wife is as soon as is possible and he can ask nurse to page for chaplain at any time.Marland Kitchen    01/30/17 2100  Clinical Encounter Type  Visited With Patient and family together;Health care provider  Visit Type Initial;Psychological support;Spiritual support;Social support;ED  Referral From Nurse  Spiritual Encounters  Spiritual Needs Prayer;Emotional  Stress Factors  Patient Stress Factors Health changes;Loss of control  Family Stress Factors Family relationships;Health changes;Loss of control   .Ephraim Hamburger, Chaplain

## 2017-01-30 NOTE — H&P (Signed)
Cardiology Admission History and Physical:   Patient ID: Christy Campbell; [id number]; [birth year]   Admission date: 01/30/2017  Primary Care Provider: [facility], [facility] Primary Cardiologist: Dr. Allyson Sabal Primary Electrophysiologist:  none  Chief Complaint:  Abdominal pain  Patient Profile:   Christy Campbell is a 76 y.o. female with a history of PVD with s/p multiple PTA procedures with right common iliac artery stent, left SFA stent, left SFA directional atherectomy 2013, abnormal nuclear stress test 07/2014 with mild anteroapical ischemia with no angina and persistent atrial fibrillation s/p DCCV 07/2016 on Xarelto with last dose last night.    History of Present Illness:   [name] 76 y.o. female with a history of chronic tobacco abuse, HTN, dyslipidemia and PVD with s/p multiple PTA procedures including right common iliac artery stent, left SFA stent, left SFA directional atherectomy 2013, abnormal nuclear stress test 07/2014 with mild anteroapical ischemia with no angina and persistent atrial fibrillation s/p DCCV 07/2016 on Xarelto. She was last seen by Dr. Allyson Sabal in 08/2016 and was noted to be back in atrial fibrillation and it was decided to pursue rate control.  She denied any claudication symptoms at that time. Vascular dopplers 11/2016 showed patent right common iliac artery stent, stable >50% stenosis in the in the left common iliac artery (low end of range), stable >50% stenosis in the bilateral external iliac arteries (low end of range), new 50-99% stenosis in the right mid to distal SFA, s/p angioplasty and patent left SFA stent. She was seen back by Dr. Allyson Sabal last month and medical management was continued as she had no claudication symptoms.    She was in her USOH until yesterday when she started having intermittent upper abdominal pain under her breasts across her chest.  This was intermittent last night but today became fairly constant and was associated with  nausea.  There was also some mild SOB.  She denied any diaphoresis.  She says that her epigastric discomfort was radiating into her neck and rated a 5/10 currently.  The only thing that makes it better is lying down.  In ER EKG showed mild ST elevation in the inferior leads with reciprocal changes in I and aVL that was new from prior EKG.  Troponin 3.2 and code STEMI called.  Other notable findings that were found were creatinine 1.23, and WBC 15.5.  Chest xray pending.  Cardiology is now asked to admit.   Past Medical History:  Diagnosis Date  . Anemia    "long time ago"  . Arthritis    "back; right knee and ankle"  . Dyslipidemia   . GERD (gastroesophageal reflux disease)   . H/O cardiovascular stress test 12/06/08   normal, no ischemia  . History of stomach ulcers 03/01/12   "little bitty ones"  . Hypertension    echo 12/06/08-EF>55%, mild AS and mild pulmonary htn  . Kidney stones   . PVD (peripheral vascular disease) (HCC) 12/29/2011   h/o right common iliac artery stent, left SFA stent and left SFA directional atherectomy 02/29/12  . Tobacco abuse     Past Surgical History:  Procedure Laterality Date  . APPENDECTOMY    . ATHERECTOMY  03/01/2012  . ATHERECTOMY N/A 12/29/2011   Procedure: ATHERECTOMY;  Surgeon: Runell Gess, MD;  Location: [facility] CATH LAB;  Service: Cardiovascular;  Laterality: N/A;  . ATHERECTOMY N/A 03/01/2012   Procedure: ATHERECTOMY;  Surgeon: Runell Gess, MD;  Location: [facility] CATH LAB;  Service: Cardiovascular;  Laterality: N/A;  . CARDIOVERSION N/A 08/10/2016   Procedure: CARDIOVERSION;  Surgeon: Thurmon Fair, MD;  Location: [facility] ENDOSCOPY;  Service: Cardiovascular;  Laterality: N/A;  . CHOLECYSTECTOMY    . COLONOSCOPY    . EYE SURGERY     Vitrectomy, and bilateral cataracts  . INGUINAL HERNIA REPAIR  1990's   right  . KNEE ARTHROSCOPY  1990's   bilaterally  . LASER PHOTO ABLATION Right 07/27/2013   Procedure: LASER PHOTO ABLATION;  Surgeon: Edmon Crape,  MD;  Location: [facility] OR;  Service: Ophthalmology;  Laterality: Right;  . PARS PLANA VITRECTOMY Right 07/27/2013   Procedure: PARS PLANA VITRECTOMY WITH 25 GAUGE;  Surgeon: Edmon Crape, MD;  Location: [facility] OR;  Service: Ophthalmology;  Laterality: Right;  . PERIPHERAL ARTERIAL STENT GRAFT  03/01/2012   successful directional atheretomy using turbo hawk of a focal prox left SFA lesion   . PV angiogram  12/29/11   successful directinal atherectomy using the turbo hawk to righ SFA stenosis  . PV angiogram  04/17/10   successful PTA/stenting of the left superficial femoral artery and right iliac  . TUBAL LIGATION  1960's  . VAGINAL HYSTERECTOMY  1970's     Medications Prior to Admission: Prior to Admission medications   Medication Sig Start Date End Date Taking? Authorizing Provider  acetaminophen (TYLENOL) 325 MG tablet Take 325 mg by mouth 2 (two) times daily as needed. For pain    [provider]  diltiazem (DILACOR XR) 240 MG 24 hr capsule Take 360 mg by mouth daily.     [provider]  Garlic 10 MG CAPS Take 1 capsule by mouth daily.     [provider]  hydrALAZINE (APRESOLINE) 10 MG tablet Take 1 tablet by mouth 3 (three) times daily. 09/22/13   [provider]  hydrochlorothiazide (HYDRODIURIL) 25 MG tablet Take 25 mg by mouth daily.    [provider]  lisinopril (PRINIVIL,ZESTRIL) 40 MG tablet Take 40 mg by mouth daily.    [provider]  metoprolol tartrate (LOPRESSOR) 25 MG tablet Take 25 mg by mouth 2 (two) times daily.     [provider]  Multiple Vitamin (MULITIVITAMIN WITH MINERALS) TABS Take 1 tablet by mouth daily.    [provider]  omega-3 acid ethyl esters (LOVAZA) 1 G capsule Take 1 g by mouth daily.     [provider]  rivaroxaban (XARELTO) 20 MG TABS tablet Take 1 tablet (20 mg total) by mouth daily with supper. 01/22/17   Runell Gess, MD  vitamin C (ASCORBIC ACID) 500 MG tablet Take 500 mg by  mouth daily.    [provider]     Allergies:    Allergies  Allergen Reactions  . Aspirin Nausea And Vomiting and Other (See Comments)    "pain; hurting"  . Tetanus Toxoids Anaphylaxis and Swelling  . Pravastatin     myalgia    Social History:   Social History   Social History  . Marital status: Married    Spouse name: N/A  . Number of children: N/A  . Years of education: N/A   Occupational History  . Not on file.   Social History Main Topics  . Smoking status: Current Some Day Smoker    Packs/day: 0.50    Years: 10.00    Types: Cigarettes  . Smokeless tobacco: Never Used  . Alcohol use No  . Drug use: No  . Sexual activity: Yes   Other Topics Concern  .  Not on file   Social History Narrative  . No narrative on file    Family History:   The patient's family history includes CVA in her mother; Cancer in her mother; Pneumonia in her father.    ROS:  Please see the history of present illness.  All other ROS reviewed and negative.     Physical Exam/Data:   Vitals:   01/30/17 1846 01/30/17 1847 01/30/17 2039 01/30/17 2045  BP:  127/79 110/76 104/76  Pulse:  61 (!) 51 (!) 57  Resp:  18 18 18   Temp:  (!) 97.4 F (36.3 C)    TempSrc:  Oral    SpO2:  97% 94% 91%  Weight: 185 lb (83.9 kg)     Height: 5\' 7"  (1.702 m)      No intake or output data in the 24 hours ending 01/30/17 2130 Filed Weights   01/30/17 1846  Weight: 185 lb (83.9 kg)   Body mass index is 28.98 kg/m.  General:  Well nourished, well developed, in no acute distress HEENT: normal Lymph: no adenopathy Neck: no JVD Endocrine:  No thryomegaly Vascular: No carotid bruits; FA pulses 2+ bilaterally without bruits  Cardiac:  normal S1, S2; RRR; no murmur  Lungs:  clear to auscultation bilaterally, no wheezing, rhonchi or rales  Abd: soft, nontender, no hepatomegaly  Ext: no edema, trace distal pulses Musculoskeletal:  No deformities, BUE and BLE strength normal and equal Skin:  warm and dry  Neuro:  CNs 2-12 intact, no focal abnormalities noted Psych:  Normal affect    EKG:  The ECG that was done 01/30/2017 was personally reviewed and demonstrates atrial fibrillation with ST elevation in inferior leads with reciprocal changes in I and aVL.    Relevant CV Studies: none  Laboratory Data:  Chemistry  Recent Labs Lab 01/30/17 1844  NA 137  K 4.4  CL 105  CO2 22  GLUCOSE 145*  BUN 28*  CREATININE 1.23*  CALCIUM 9.6  GFRNONAA 42*  GFRAA 48*  ANIONGAP 10     Recent Labs Lab 01/30/17 1844  PROT 6.2*  ALBUMIN 3.8  AST 122*  ALT 87*  ALKPHOS 106  BILITOT 0.8   Hematology  Recent Labs Lab 01/30/17 1844  WBC 15.5*  RBC 5.11  HGB 14.1  HCT 42.3  MCV 82.8  MCH 27.6  MCHC 33.3  RDW 16.1*  PLT 315   Cardiac EnzymesNo results for input(s): TROPONINI in the last 168 hours.   Recent Labs Lab 01/30/17 2110  TROPIPOC 3.20*    BNPNo results for input(s): BNP, PROBNP in the last 168 hours.  DDimer No results for input(s): DDIMER in the last 168 hours.  Radiology/Studies:  No results found.  Assessment and Plan:   1.  Acute inferior STEMI - presenting as severe epigastric pain intermittent last night but today became constant and severe with nausea and radiation to her neck.  Trop elevated at 3. - admit to CCU - emergent cath per Dr. [place] - IV Heparin gtt per pharmacy - Hold Xarelto - ASA 81mg  daily - hold BB, hydralazine, ACE I and cardizem due to soft BP - hold diuretic due to contrast for cath - continue statin - cycle troponin until it peaks - 2D echo in am to assess LVF  2.  HTN  - BP on soft side - hold antihypertensive meds for now  3.  Hyperlipidemia with LDL goal < 70 - she has an intolerance to pravastatin - start high  dose statin with atorvastatin 80mg  daily - check FLP in am although may be falsely low in setting of acute MI - will need FLP and ALT in 6 weeks  4.  PVD - with s/p multiple PTA procedures  including right common iliac artery stent, left SFA stent, left SFA directional atherectomy 2013.  Recent arterial dopplers showed patent right common iliac artery stent, stable >50% stenosis in the in the left common iliac artery (low end of range), stable >50% stenosis in the bilateral external iliac arteries (low end of range), new 50-99% stenosis in the right mid to distal SFA, s/p angioplasty and patent left SFA stent. She was seen back by Dr. Allyson Sabal last month and medical management was continued as she had no claudication symptoms.   - continue ASA  - start statin  5.  Permanent atrial fibrillation - s/p DCCV 07/2016 with reoccurrence - decision made to pursue rate control - Hold Xarelto - IV heparin per pharmacy - HR controlled currently - will hold BB and CCB for now due to soft BP    Severity of Illness: The appropriate patient status for this patient is INPATIENT. Inpatient status is judged to be reasonable and necessary in order to provide the required intensity of service to ensure the patient's safety. The patient's presenting symptoms, physical exam findings, and initial radiographic and laboratory data in the context of their chronic comorbidities is felt to place them at high risk for further clinical deterioration. Furthermore, it is not anticipated that the patient will be medically stable for discharge from the hospital within 2 midnights of admission. The following factors support the patient status of inpatient.   " The patient's presenting symptoms include epigastric and neck pain in setting of inferior STEMI. " The worrisome physical exam findings include ongoing CP, hypotension and elevated troponin with EKG changes. " The initial radiographic and laboratory data are worrisome because of elevated troponin and ST elevation on EKG. " The chronic co-morbidities include PVD, HTN, hyperlipidemia, tobacco abuse.   * I certify that at the point of admission it is my clinical  judgment that the patient will require inpatient hospital care spanning beyond 2 midnights from the point of admission due to high intensity of service, high risk for further deterioration and high frequency of surveillance required.*    Signed, Armanda Magic, MD  01/30/2017 9:30 PM

## 2017-01-30 NOTE — Progress Notes (Signed)
Found pt's husband in 2H waiting rm now joined by daughter Elease Hashimoto -- who said her dad had died just three months ago after two yrs fighting cancer. Found doctor in Cath Lab ready to come talk w/ pt's family; accompanied him to do so. Family grieved to receive news that pt had heart attack, would likely need bypass surgery. They understood that pt would have to do her part (e.g., stop smoking), thanking doctor for clarity.   Provided emotional/spiritual support, ministry of presence/touch, and prayer.Family will wait to see pt after she is settled in rm, then husband may go home (an hr away) to take care of dogs/collect some things, as daughter stays w/ mom tonight. Chaplain available for f/u   01/30/17 2200  Clinical Encounter Type  Visited With Patient and family together;Health care provider  Visit Type Follow-up;Psychological support;Spiritual support;Social support;Post-op;Critical Care  Referral From Nurse  Spiritual Encounters  Spiritual Needs Prayer;Emotional  Stress Factors  Patient Stress Factors Health changes;Loss of control;Major life changes  Family Stress Factors Family relationships;Health changes;Loss of control;Major life changes   Ephraim Hamburger, [address]

## 2017-01-30 NOTE — ED Triage Notes (Signed)
Pt presents to the ed for complaints of upper abdominal pain that started yesterday that feels achy and burning. States that she also feels nauseous. She has a history of stomach ulcers and cannot tell if this is what that is or not.

## 2017-01-30 NOTE — ED Provider Notes (Signed)
[facility]-EMERGENCY DEPT Provider Note   CSN: [id number] Arrival date & time: 01/30/17  1839     History   Chief Complaint Chief Complaint  Patient presents with  . Abdominal Pain    HPI Akylah Hascall Leatherbury is a 76 y.o. female.  The history is provided by the patient.  Abdominal Pain   This is a new problem. The current episode started 12 to 24 hours ago. The problem occurs constantly. The problem has been gradually worsening. The pain is associated with an unknown factor. The pain is located in the epigastric region. The quality of the pain is dull, cramping and aching. The pain is moderate. Associated symptoms include nausea. Pertinent negatives include fever, diarrhea, melena, vomiting and constipation. Associated symptoms comments: SOB. Exacerbated by: exertion. Nothing relieves the symptoms.    Past Medical History:  Diagnosis Date  . Anemia    "long time ago"  . Arthritis    "back; right knee and ankle"  . Dyslipidemia   . GERD (gastroesophageal reflux disease)   . H/O cardiovascular stress test 12/06/08   normal, no ischemia  . History of stomach ulcers 03/01/12   "little bitty ones"  . Hypertension    echo 12/06/08-EF>55%, mild AS and mild pulmonary htn  . Kidney stones   . PVD (peripheral vascular disease) (HCC) 12/29/2011   h/o right common iliac artery stent, left SFA stent and left SFA directional atherectomy 02/29/12  . Tobacco abuse     Patient Active Problem List   Diagnosis Date Noted  . Atrial fibrillation, persistent (HCC) 07/22/2016  . Transient hypotension and bradycardia in setting of "indigestion" 07/18/2014  . Nephrolithiasis 07/18/2014  . Smoker 03/24/2013  . Renal lesion, incidental finding on CT 12/29/11 12/30/2011  . HTN, LVH, Nl LVF May 2010 12/30/2011  . Dyslipidemia 12/30/2011  . Normal Myoview, May 2010 12/30/2011  . PVD, Rt SFA PTA 12/29/11,  Lt. PTA SFA 03/01/12  12/29/2011  . Claudication in peripheral vascular disease, lifestyle limiting  12/29/2011    Past Surgical History:  Procedure Laterality Date  . APPENDECTOMY    . ATHERECTOMY  03/01/2012  . ATHERECTOMY N/A 12/29/2011   Procedure: ATHERECTOMY;  Surgeon: Runell Gess, MD;  Location: [facility] CATH LAB;  Service: Cardiovascular;  Laterality: N/A;  . ATHERECTOMY N/A 03/01/2012   Procedure: ATHERECTOMY;  Surgeon: Runell Gess, MD;  Location: [facility] CATH LAB;  Service: Cardiovascular;  Laterality: N/A;  . CARDIOVERSION N/A 08/10/2016   Procedure: CARDIOVERSION;  Surgeon: Thurmon Fair, MD;  Location: [facility] ENDOSCOPY;  Service: Cardiovascular;  Laterality: N/A;  . CHOLECYSTECTOMY    . COLONOSCOPY    . EYE SURGERY     Vitrectomy, and bilateral cataracts  . INGUINAL HERNIA REPAIR  1990's   right  . KNEE ARTHROSCOPY  1990's   bilaterally  . LASER PHOTO ABLATION Right 07/27/2013   Procedure: LASER PHOTO ABLATION;  Surgeon: Edmon Crape, MD;  Location: [facility] OR;  Service: Ophthalmology;  Laterality: Right;  . PARS PLANA VITRECTOMY Right 07/27/2013   Procedure: PARS PLANA VITRECTOMY WITH 25 GAUGE;  Surgeon: Edmon Crape, MD;  Location: [facility] OR;  Service: Ophthalmology;  Laterality: Right;  . PERIPHERAL ARTERIAL STENT GRAFT  03/01/2012   successful directional atheretomy using turbo hawk of a focal prox left SFA lesion   . PV angiogram  12/29/11   successful directinal atherectomy using the turbo hawk to righ SFA stenosis  . PV angiogram  04/17/10   successful PTA/stenting of the left superficial femoral artery  and right iliac  . TUBAL LIGATION  1960's  . VAGINAL HYSTERECTOMY  1970's    OB History    No data available       Home Medications    Prior to Admission medications   Medication Sig Start Date End Date Taking? Authorizing Provider  acetaminophen (TYLENOL) 325 MG tablet Take 325 mg by mouth 2 (two) times daily as needed. For pain    [provider]  diltiazem (DILACOR XR) 240 MG 24 hr capsule Take 360 mg by mouth daily.     [provider]  Garlic 10 MG  CAPS Take 1 capsule by mouth daily.     [provider]  hydrALAZINE (APRESOLINE) 10 MG tablet Take 1 tablet by mouth 3 (three) times daily. 09/22/13   [provider]  hydrochlorothiazide (HYDRODIURIL) 25 MG tablet Take 25 mg by mouth daily.    [provider]  lisinopril (PRINIVIL,ZESTRIL) 40 MG tablet Take 40 mg by mouth daily.    [provider]  metoprolol tartrate (LOPRESSOR) 25 MG tablet Take 25 mg by mouth 2 (two) times daily.     [provider]  Multiple Vitamin (MULITIVITAMIN WITH MINERALS) TABS Take 1 tablet by mouth daily.    [provider]  omega-3 acid ethyl esters (LOVAZA) 1 G capsule Take 1 g by mouth daily.     [provider]  rivaroxaban (XARELTO) 20 MG TABS tablet Take 1 tablet (20 mg total) by mouth daily with supper. 01/22/17   Runell Gess, MD  vitamin C (ASCORBIC ACID) 500 MG tablet Take 500 mg by mouth daily.    [provider]    Family History No family history on file.  Social History Social History  Substance Use Topics  . Smoking status: Current Some Day Smoker    Packs/day: 0.50    Years: 10.00    Types: Cigarettes  . Smokeless tobacco: Never Used  . Alcohol use No     Allergies   Aspirin; Tetanus toxoids; and Pravastatin   Review of Systems Review of Systems  Constitutional: Negative for fever.  Gastrointestinal: Positive for abdominal pain and nausea. Negative for constipation, diarrhea, melena and vomiting.  All other systems are reviewed and are negative for acute change except as noted in the HPI    Physical Exam Updated Vital Signs BP 127/79   Pulse 61   Temp (!) 97.4 F (36.3 C) (Oral)   Resp 18   Ht 5\' 7"  (1.702 m)   Wt 83.9 kg (185 lb)   SpO2 97%   BMI 28.98 kg/m   Physical Exam  Constitutional: She is oriented to person, place, and time. She appears well-developed and well-nourished. No distress.  HENT:  Head: Normocephalic and atraumatic.  Nose:  Nose normal.  Eyes: Pupils are equal, round, and reactive to light. Conjunctivae and EOM are normal. Right eye exhibits no discharge. Left eye exhibits no discharge. No scleral icterus.  Neck: Normal range of motion. Neck supple.  Cardiovascular: Normal rate and regular rhythm.  Exam reveals no gallop and no friction rub.   No murmur heard. Pulmonary/Chest: Effort normal and breath sounds normal. No stridor. No respiratory distress. She has no rales.  Abdominal: Soft. She exhibits no distension. There is no tenderness. There is no rigidity, no rebound and no guarding.  Musculoskeletal: She exhibits no edema or tenderness.  Neurological: She is alert and oriented to person, place, and time.  Skin: Skin is warm and dry. No rash  noted. She is not diaphoretic. No erythema.  Psychiatric: She has a normal mood and affect.  Vitals reviewed.    ED Treatments / Results  Labs (all labs ordered are listed, but only abnormal results are displayed) Labs Reviewed  COMPREHENSIVE METABOLIC PANEL - Abnormal; Notable for the following:       Result Value   Glucose, Bld 145 (*)    BUN 28 (*)    Creatinine, Ser 1.23 (*)    Total Protein 6.2 (*)    AST 122 (*)    ALT 87 (*)    GFR calc non Af Amer 42 (*)    GFR calc Af Amer 48 (*)    All other components within normal limits  CBC - Abnormal; Notable for the following:    WBC 15.5 (*)    RDW 16.1 (*)    All other components within normal limits  URINALYSIS, ROUTINE W REFLEX MICROSCOPIC - Abnormal; Notable for the following:    Color, Urine AMBER (*)    APPearance HAZY (*)    Hgb urine dipstick SMALL (*)    Protein, ur 100 (*)    Squamous Epithelial / LPF 0-5 (*)    All other components within normal limits  PROTIME-INR - Abnormal; Notable for the following:    Prothrombin Time 16.7 (*)    All other components within normal limits  TROPONIN I - Abnormal; Notable for the following:    Troponin I 5.54 (*)    All other components within normal  limits  I-STAT TROPOININ, ED - Abnormal; Notable for the following:    Troponin i, poc 3.20 (*)    All other components within normal limits  MRSA PCR SCREENING  MRSA PCR SCREENING  LIPASE, BLOOD  APTT  LIPID PANEL  CBC WITH DIFFERENTIAL/PLATELET  OCCULT BLOOD X 1 CARD TO LAB, STOOL  BASIC METABOLIC PANEL  CBC  TROPONIN I  TROPONIN I  TROPONIN I  HEPARIN LEVEL (UNFRACTIONATED)  HEPARIN LEVEL (UNFRACTIONATED)  APTT    EKG  EKG Interpretation  Date/Time:  Saturday January 30 2017 20:43:32 EDT Ventricular Rate:  67 PR Interval:    QRS Duration: 74 QT Interval:  415 QTC Calculation: 439 R Axis:   -12 Text Interpretation:  Sinus rhythm Ventricular premature complex Short PR interval Inferior infarct, acute (RCA) Anteroseptal infarct, age indeterminate Probable RV involvement, suggest recording right precordial leads ** ** ACUTE MI / STEMI ** ** Confirmed by Drema Pry [phone]) on 01/30/2017 9:06:05 PM       Radiology Dg Chest Port 1 View  Result Date: 01/30/2017 CLINICAL DATA:  Epigastric abdominal pain EXAM: PORTABLE CHEST 1 VIEW COMPARISON:  06/19/2016 chest radiograph. FINDINGS: Stable cardiomediastinal silhouette with top-normal heart size. No pneumothorax. No pleural effusion. Mild scarring at the lung bases, stable. No pulmonary edema. No acute consolidative airspace disease. IMPRESSION: No active cardiopulmonary disease.  Stable mild bibasilar scarring. Electronically Signed   By: Delbert Phenix M.D.   On: 01/30/2017 21:42    Procedures Procedures (including critical care time) CRITICAL CARE Performed by: Amadeo Garnet Tyge Somers Total critical care time: 30 minutes Critical care time was exclusive of separately billable procedures and treating other patients. Critical care was necessary to treat or prevent imminent or life-threatening deterioration. Critical care was time spent personally by me on the following activities: development of treatment plan with patient  and/or surrogate as well as nursing, discussions with consultants, evaluation of patient's response to treatment, examination of patient, obtaining history from patient or  surrogate, ordering and performing treatments and interventions, ordering and review of laboratory studies, ordering and review of radiographic studies, pulse oximetry and re-evaluation of patient's condition.   Medications Ordered in ED Medications  ondansetron (ZOFRAN-ODT) 4 MG disintegrating tablet (not administered)  ondansetron (ZOFRAN-ODT) disintegrating tablet 4 mg (4 mg Oral Given 01/30/17 1849)     Initial Impression / Assessment and Plan / ED Course  I have reviewed the triage vital signs and the nursing notes.  Pertinent labs & imaging results that were available during my care of the patient were reviewed by me and considered in my medical decision making (see chart for details).  Clinical Course as of Feb 01 36  Sat Jan 30, 2017  2104 Epigastric abdominal pain with nausea. Pain seemed to be exertional. EKG concerning for inferior STEMI. Confirm with cardiology. Code STEMI initiated. Patient given 324 of aspirin and started on heparin drip. IV fluids given. Nitroglycerin held given a systolic blood pressure of 104 with inferior STEMI. Patient reported that her pain is mild and will hold off on any pain medicine. She is complaining of nausea so we'll provide some nausea meds.  [PC]    Clinical Course User Index [PC] Micharl Helmes, Amadeo Garnet, MD    Patient taken to Cath Lab for further management  Final Clinical Impressions(s) / ED Diagnoses   Final diagnoses:  Epigastric pain  ST elevation myocardial infarction (STEMI) involving other coronary artery of inferior wall (HCC)      Trust Leh, Amadeo Garnet, MD 01/31/17 0041

## 2017-01-31 DIAGNOSIS — I219 Acute myocardial infarction, unspecified: Secondary | ICD-10-CM

## 2017-01-31 LAB — TSH: TSH: 0.675 u[IU]/mL (ref 0.350–4.500)

## 2017-01-31 LAB — BASIC METABOLIC PANEL
ANION GAP: 10 (ref 5–15)
BUN: 26 mg/dL — ABNORMAL HIGH (ref 6–20)
CO2: 20 mmol/L — AB (ref 22–32)
Calcium: 8.8 mg/dL — ABNORMAL LOW (ref 8.9–10.3)
Chloride: 106 mmol/L (ref 101–111)
Creatinine, Ser: 1.08 mg/dL — ABNORMAL HIGH (ref 0.44–1.00)
GFR calc Af Amer: 57 mL/min — ABNORMAL LOW (ref >60)
GFR, EST NON AFRICAN AMERICAN: 49 mL/min — AB (ref >60)
GLUCOSE: 118 mg/dL — AB (ref 65–99)
POTASSIUM: 3.7 mmol/L (ref 3.5–5.1)
Sodium: 136 mmol/L (ref 135–145)

## 2017-01-31 LAB — CBC
HEMATOCRIT: 39.5 % (ref 36.0–46.0)
HEMOGLOBIN: 13 g/dL (ref 12.0–15.0)
MCH: 27 pg (ref 26.0–34.0)
MCHC: 32.9 g/dL (ref 30.0–36.0)
MCV: 82 fL (ref 78.0–100.0)
Platelets: 259 10*3/uL (ref 150–400)
RBC: 4.82 MIL/uL (ref 3.87–5.11)
RDW: 16 % — ABNORMAL HIGH (ref 11.5–15.5)
WBC: 12.8 10*3/uL — ABNORMAL HIGH (ref 4.0–10.5)

## 2017-01-31 LAB — TROPONIN I
TROPONIN I: 30.19 ng/mL — AB (ref <0.03)
Troponin I: 24.87 ng/mL (ref <0.03)
Troponin I: 30.16 ng/mL (ref <0.03)

## 2017-01-31 LAB — HEPARIN LEVEL (UNFRACTIONATED): HEPARIN UNFRACTIONATED: 1.36 [IU]/mL — AB (ref 0.30–0.70)

## 2017-01-31 LAB — MRSA PCR SCREENING: MRSA by PCR: NEGATIVE

## 2017-01-31 LAB — GLUCOSE, CAPILLARY
GLUCOSE-CAPILLARY: 126 mg/dL — AB (ref 65–99)
GLUCOSE-CAPILLARY: 137 mg/dL — AB (ref 65–99)
GLUCOSE-CAPILLARY: 138 mg/dL — AB (ref 65–99)

## 2017-01-31 LAB — MAGNESIUM: Magnesium: 1.7 mg/dL (ref 1.7–2.4)

## 2017-01-31 MED ORDER — GUAIFENESIN-DM 100-10 MG/5ML PO SYRP
5.0000 mL | ORAL_SOLUTION | ORAL | Status: DC | PRN
  Administered 2017-01-31 – 2017-02-03 (×5): 5 mL via ORAL
  Filled 2017-01-31 (×5): qty 5

## 2017-01-31 MED ORDER — METOPROLOL TARTRATE 25 MG PO TABS
25.0000 mg | ORAL_TABLET | Freq: Once | ORAL | Status: AC
Start: 2017-02-01 — End: 2017-02-01
  Administered 2017-02-01: 25 mg via ORAL
  Filled 2017-01-31: qty 1

## 2017-01-31 MED ORDER — METOPROLOL TARTRATE 50 MG PO TABS
50.0000 mg | ORAL_TABLET | Freq: Two times a day (BID) | ORAL | Status: DC
  Administered 2017-01-31 – 2017-02-03 (×7): 50 mg via ORAL
  Filled 2017-01-31 (×7): qty 1

## 2017-01-31 MED ORDER — RIVAROXABAN 20 MG PO TABS: 20.0000 mg | ORAL_TABLET | Freq: Every day | ORAL | Status: DC

## 2017-01-31 MED ORDER — RIVAROXABAN 20 MG PO TABS
20.0000 mg | ORAL_TABLET | Freq: Every day | ORAL | Status: DC
  Administered 2017-01-31 – 2017-02-03 (×4): 20 mg via ORAL
  Filled 2017-01-31 (×4): qty 1

## 2017-01-31 MED ORDER — LISINOPRIL 20 MG PO TABS
20.0000 mg | ORAL_TABLET | Freq: Two times a day (BID) | ORAL | Status: DC
  Administered 2017-01-31 – 2017-02-04 (×9): 20 mg via ORAL
  Filled 2017-01-31 (×7): qty 1
  Filled 2017-01-31: qty 2
  Filled 2017-01-31: qty 1

## 2017-01-31 MED ORDER — ROPINIROLE HCL 0.5 MG PO TABS
0.2500 mg | ORAL_TABLET | Freq: Every day | ORAL | Status: DC
  Administered 2017-01-31 – 2017-02-03 (×3): 0.25 mg via ORAL
  Filled 2017-01-31 (×5): qty 1

## 2017-01-31 MED ORDER — TICAGRELOR 90 MG PO TABS
90.0000 mg | ORAL_TABLET | Freq: Once | ORAL | Status: AC
  Administered 2017-02-01: 90 mg via ORAL
  Filled 2017-01-31: qty 1

## 2017-01-31 NOTE — Progress Notes (Addendum)
ANTICOAGULATION CONSULT NOTE - Initial Consult  Pharmacy Consult for heparin to rivaroxaban Indication: atrial fibrillation  Allergies  Allergen Reactions  . Aspirin Nausea And Vomiting and Other (See Comments)    "pain; hurting"  . Tetanus Toxoids Anaphylaxis and Swelling  . Pravastatin     myalgia    Patient Measurements: Height: 5\' 6"  (167.6 cm) Weight: 194 lb 10.7 oz (88.3 kg) IBW/kg (Calculated) : 59.3   Vital Signs: Temp: 99.8 F (37.7 C) (07/15 0700) Temp Source: Oral (07/15 0700) BP: 150/85 (07/15 1030) Pulse Rate: 93 (07/15 1030)  Labs:  Recent Labs  01/30/17 1844 01/30/17 2104 01/31/17 0343 01/31/17 0933  HGB 14.1  --  13.0  --   HCT 42.3  --  39.5  --   PLT 315  --  259  --   APTT  --  34  --   --   LABPROT  --  16.7*  --   --   INR  --  1.35  --   --   HEPARINUNFRC  --   --  1.36*  --   CREATININE 1.23*  --  1.08*  --   TROPONINI  --  5.54* 24.87* 30.19*    Estimated Creatinine Clearance: 50.4 mL/min (A) (by C-G formula based on SCr of 1.08 mg/dL (H)).   Medical History: Past Medical History:  Diagnosis Date  . Anemia    "long time ago"  . Arthritis    "back; right knee and ankle"  . Dyslipidemia   . GERD (gastroesophageal reflux disease)   . H/O cardiovascular stress test 12/06/08   normal, no ischemia  . History of stomach ulcers 03/01/12   "little bitty ones"  . Hypertension    echo 12/06/08-EF>55%, mild AS and mild pulmonary htn  . Kidney stones   . PVD (peripheral vascular disease) (HCC) 12/29/2011   h/o right common iliac artery stent, left SFA stent and left SFA directional atherectomy 02/29/12  . Tobacco abuse     Assessment: 8 yoF presents with STEMI found to have significant 3-vessel disease with plans for CABG in 4-6 weeks. Pt started on heparin drip post-cath now to resume PTA rivaroxaban for PMH atrial fibrillation. CrCl ~82ml/min with total body weight so home 20mg  dose appropriate.  Goal of Therapy:  Full Dose  Anticoagulation Monitor platelets by anticoagulation protocol: Yes   Plan:  -Discontinue heparin at time of resuming rivaroxaban -Rivaroxaban 20mg  PO daily -Considered shortened duration of DAPT with concomitant anticoagulation -Pharmacy will sign off, please re-consult as needed  Fredonia Highland, PharmD PGY-2 Cardiology Pharmacy Resident Pager: [phone] 01/31/2017

## 2017-01-31 NOTE — Progress Notes (Signed)
Progress Note  Patient Name: Christy Campbell Date of Encounter: 01/31/2017  Primary Cardiologist: Dorma Russell     Patient Profile    Christy Campbell is a 76 y.o. female with a history of PVD with s/p multiple PTA procedures with right common iliac artery stent, left SFA stent, left SFA directional atherectomy 2013, abnormal nuclear stress test 07/2014 with mild anteroapical ischemia with no angina and persistent atrial fibrillation s/p DCCV 07/2016 on Xarelto with last dose last night.    7/14 presented with chest pain with elevated troponin 3.2 and ST changes in the inferior leads Catheterization-emergent demonstrated severe three-vessel disease>>  90% focal proximal LAD     85% segmental LAD following the first diagonal    95% ostial first OM   100% mid LCx. Left to left collaterals to OM2 and OM3     100% mid RCA- this is the culprit lesion>>Successful stenting of the mid RCA with DES   >>Mild LV dysfunction with inferior wall motion abnormality.  .  >>Normal LVEDP  .     Subjective  Feels much better this morning. Breathing is normal no chest pain.    Inpatient Medications    Scheduled Meds: . aspirin EC  81 mg Oral Daily  . atorvastatin  80 mg Oral q1800  . omega-3 acid ethyl esters  1 g Oral Daily  . sodium chloride flush  3 mL Intravenous Q12H  . ticagrelor  90 mg Oral BID   Continuous Infusions: . sodium chloride Stopped (01/31/17 0000)  . sodium chloride    . sodium chloride 1 mL/kg/hr (01/30/17 2348)  . heparin 1,200 Units/hr (01/31/17 0646)   PRN Meds: sodium chloride, acetaminophen, guaiFENesin-dextromethorphan, nitroGLYCERIN, ondansetron (ZOFRAN) IV, sodium chloride flush   Vital Signs    Vitals:   01/31/17 0432 01/31/17 0500 01/31/17 0600 01/31/17 0700  BP:  (!) 159/86  (!) 155/103  Pulse:  89 78 85  Resp:  (!) 27 18 (!) 21  Temp: 99.5 F (37.5 C)   99.8 F (37.7 C)  TempSrc: Oral   Oral  SpO2:  95% 94% 95%  Weight:      Height:         Intake/Output Summary (Last 24 hours) at 01/31/17 0908 Last data filed at 01/31/17 0700  Gross per 24 hour  Intake           666.88 ml  Output              130 ml  Net           536.88 ml   Filed Weights   01/30/17 1846 01/30/17 2300  Weight: 185 lb (83.9 kg) 194 lb 10.7 oz (88.3 kg)    Telemetry    Sinus rhythm Personally Reviewed  ECG       Physical Exam  Well developed and nourished in no acute distress HENT normal Neck supple with JVP-flat Clear Regular rate and rhythm, no murmurs or gallops Abd-soft with active BS No Clubbing cyanosis edema Skin-warm and dry A & Oriented  Grossly normal sensory and motor function Right hand with good perfusion    Labs    Chemistry Recent Labs Lab 01/30/17 1844 01/31/17 0343  NA 137 136  K 4.4 3.7  CL 105 106  CO2 22 20*  GLUCOSE 145* 118*  BUN 28* 26*  CREATININE 1.23* 1.08*  CALCIUM 9.6 8.8*  PROT 6.2*  --   ALBUMIN 3.8  --   AST 122*  --  ALT 87*  --   ALKPHOS 106  --   BILITOT 0.8  --   GFRNONAA 42* 49*  GFRAA 48* 57*  ANIONGAP 10 10     Hematology Recent Labs Lab 01/30/17 1844 01/31/17 0343  WBC 15.5* 12.8*  RBC 5.11 4.82  HGB 14.1 13.0  HCT 42.3 39.5  MCV 82.8 82.0  MCH 27.6 27.0  MCHC 33.3 32.9  RDW 16.1* 16.0*  PLT 315 259    Cardiac Enzymes Recent Labs Lab 01/30/17 2104 01/31/17 0343  TROPONINI 5.54* 24.87*    Recent Labs Lab 01/30/17 2110  TROPIPOC 3.20*     BNPNo results for input(s): BNP, PROBNP in the last 168 hours.   DDimer No results for input(s): DDIMER in the last 168 hours.   Radiology    Dg Chest Port 1 View  Result Date: 01/30/2017 CLINICAL DATA:  Epigastric abdominal pain EXAM: PORTABLE CHEST 1 VIEW COMPARISON:  06/19/2016 chest radiograph. FINDINGS: Stable cardiomediastinal silhouette with top-normal heart size. No pneumothorax. No pleural effusion. Mild scarring at the lung bases, stable. No pulmonary edema. No acute consolidative airspace disease.  IMPRESSION: No active cardiopulmonary disease.  Stable mild bibasilar scarring. Electronically Signed   By: Delbert Phenix M.D.   On: 01/30/2017 21:42    Cardiac Studies   As above Echo pending     Assessment & Plan    Inferior wall MI-acute status post DES Three-vessel coronary disease-severe Peripheral vascular disease Hypertension Atrial fibrillation-permanent   We'll stop the calcium blocker and continue the beta blocker.  resume lisinopril  Resume anticoagulation.   Signed, Sherryl Manges, MD  01/31/2017, 9:08 AM

## 2017-02-01 ENCOUNTER — Encounter (HOSPITAL_COMMUNITY): Payer: Self-pay | Admitting: Cardiology

## 2017-02-01 ENCOUNTER — Other Ambulatory Visit (HOSPITAL_COMMUNITY): Payer: Medicare HMO

## 2017-02-01 DIAGNOSIS — I482 Chronic atrial fibrillation: Secondary | ICD-10-CM

## 2017-02-01 DIAGNOSIS — I10 Essential (primary) hypertension: Secondary | ICD-10-CM

## 2017-02-01 LAB — BASIC METABOLIC PANEL
Anion gap: 8 (ref 5–15)
BUN: 20 mg/dL (ref 6–20)
CALCIUM: 9 mg/dL (ref 8.9–10.3)
CO2: 22 mmol/L (ref 22–32)
CREATININE: 0.96 mg/dL (ref 0.44–1.00)
Chloride: 103 mmol/L (ref 101–111)
GFR calc Af Amer: >60 mL/min (ref >60)
GFR, EST NON AFRICAN AMERICAN: 56 mL/min — AB (ref >60)
Glucose, Bld: 122 mg/dL — ABNORMAL HIGH (ref 65–99)
POTASSIUM: 3.4 mmol/L — AB (ref 3.5–5.1)
SODIUM: 133 mmol/L — AB (ref 135–145)

## 2017-02-01 LAB — TROPONIN I: TROPONIN I: 21.79 ng/mL — AB (ref <0.03)

## 2017-02-01 LAB — GLUCOSE, CAPILLARY
GLUCOSE-CAPILLARY: 141 mg/dL — AB (ref 65–99)
GLUCOSE-CAPILLARY: 123 mg/dL — AB (ref 65–99)
GLUCOSE-CAPILLARY: 120 mg/dL — AB (ref 65–99)
Glucose-Capillary: 111 mg/dL — ABNORMAL HIGH (ref 65–99)

## 2017-02-01 MED ORDER — CLOPIDOGREL BISULFATE 75 MG PO TABS
75.0000 mg | ORAL_TABLET | Freq: Every day | ORAL | Status: DC
  Administered 2017-02-02 – 2017-02-04 (×3): 75 mg via ORAL
  Filled 2017-02-01 (×4): qty 1

## 2017-02-01 MED ORDER — POTASSIUM CHLORIDE 10 MEQ/100ML IV SOLN
10.0000 meq | INTRAVENOUS | Status: AC
  Administered 2017-02-01 (×3): 10 meq via INTRAVENOUS
  Filled 2017-02-01 (×3): qty 100

## 2017-02-01 MED ORDER — HYDRALAZINE HCL 20 MG/ML IJ SOLN
10.0000 mg | Freq: Four times a day (QID) | INTRAMUSCULAR | Status: DC | PRN
  Administered 2017-02-01 – 2017-02-03 (×2): 10 mg via INTRAVENOUS
  Filled 2017-02-01 (×2): qty 1

## 2017-02-01 MED ORDER — NITROGLYCERIN IN D5W 200-5 MCG/ML-% IV SOLN
0.0000 ug/min | INTRAVENOUS | Status: DC
  Administered 2017-02-01: 10 ug/min via INTRAVENOUS
  Filled 2017-02-01: qty 250

## 2017-02-01 MED ORDER — CLOPIDOGREL BISULFATE 300 MG PO TABS
300.0000 mg | ORAL_TABLET | Freq: Once | ORAL | Status: AC
  Administered 2017-02-01: 300 mg via ORAL
  Filled 2017-02-01: qty 1

## 2017-02-01 MED ORDER — PROMETHAZINE HCL 25 MG/ML IJ SOLN
12.5000 mg | Freq: Three times a day (TID) | INTRAMUSCULAR | Status: DC | PRN
  Administered 2017-02-01: 12.5 mg via INTRAVENOUS
  Filled 2017-02-01: qty 1

## 2017-02-01 NOTE — Progress Notes (Signed)
Progress Note  Patient Name: Christy Campbell Date of Encounter: 02/01/2017  Primary Cardiologist: Allyson Sabal  Subjective   Christy Campbell is postop day 2 inferior STEMI and placement of a bare metal stent by Dr. [place]. She denies chest pain but has had persistent nausea. She has remained hemodynamic stable but somewhat hypertensive.  Inpatient Medications    Scheduled Meds: . aspirin EC  81 mg Oral Daily  . atorvastatin  80 mg Oral q1800  . lisinopril  20 mg Oral BID  . metoprolol tartrate  50 mg Oral BID  . omega-3 acid ethyl esters  1 g Oral Daily  . rivaroxaban  20 mg Oral Q supper  . rOPINIRole  0.25 mg Oral QHS  . sodium chloride flush  3 mL Intravenous Q12H  . ticagrelor  90 mg Oral BID   Continuous Infusions: . sodium chloride Stopped (01/31/17 0000)  . sodium chloride     PRN Meds: sodium chloride, acetaminophen, guaiFENesin-dextromethorphan, nitroGLYCERIN, ondansetron (ZOFRAN) IV, promethazine, sodium chloride flush   Vital Signs    Vitals:   02/01/17 0300 02/01/17 0400 02/01/17 0500 02/01/17 0600  BP: (!) 158/117   (!) 162/102  Pulse: 92 96    Resp: (!) 25 (!) 25 18 19   Temp: 98.8 F (37.1 C)     TempSrc: Oral     SpO2: 94% 94%    Weight:      Height:        Intake/Output Summary (Last 24 hours) at 02/01/17 0756 Last data filed at 02/01/17 0535  Gross per 24 hour  Intake              450 ml  Output              552 ml  Net             -102 ml   Filed Weights   01/30/17 1846 01/30/17 2300  Weight: 185 lb (83.9 kg) 194 lb 10.7 oz (88.3 kg)    Telemetry    Atrial fibrillation with a ventricular response in the low 100 range - Personally Reviewed  ECG    Atrial fibrillation with a ventricular response of 103, left intrafascicular block. Inferior Q waves with inferolateral T-wave inversion. - Personally Reviewed  Physical Exam   GEN: No acute distress.   Neck: No JVD Cardiac: RRR, no murmurs, rubs, or gallops.  Respiratory: Clear to  auscultation bilaterally. GI: Soft, nontender, non-distended . Active bowel sounds Christy: No edema; No deformity. Neuro:  Nonfocal  Psych: Normal affect  Extremity: Right radial puncture site intact   Labs    Chemistry Recent Labs Lab 01/30/17 1844 01/31/17 0343 02/01/17 0014  NA 137 136 133*  K 4.4 3.7 3.4*  CL 105 106 103  CO2 22 20* 22  GLUCOSE 145* 118* 122*  BUN 28* 26* 20  CREATININE 1.23* 1.08* 0.96  CALCIUM 9.6 8.8* 9.0  PROT 6.2*  --   --   ALBUMIN 3.8  --   --   AST 122*  --   --   ALT 87*  --   --   ALKPHOS 106  --   --   BILITOT 0.8  --   --   GFRNONAA 42* 49* 56*  GFRAA 48* 57* >60  ANIONGAP 10 10 8      Hematology Recent Labs Lab 01/30/17 1844 01/31/17 0343  WBC 15.5* 12.8*  RBC 5.11 4.82  HGB 14.1 13.0  HCT 42.3 39.5  MCV 82.8  82.0  MCH 27.6 27.0  MCHC 33.3 32.9  RDW 16.1* 16.0*  PLT 315 259    Cardiac Enzymes Recent Labs Lab 01/31/17 0343 01/31/17 0933 01/31/17 1347 02/01/17 0014  TROPONINI 24.87* 30.19* 30.16* 21.79*    Recent Labs Lab 01/30/17 2110  TROPIPOC 3.20*     BNPNo results for input(s): BNP, PROBNP in the last 168 hours.   DDimer No results for input(s): DDIMER in the last 168 hours.   Radiology    Dg Chest Port 1 View  Result Date: 01/30/2017 CLINICAL DATA:  Epigastric abdominal pain EXAM: PORTABLE CHEST 1 VIEW COMPARISON:  06/19/2016 chest radiograph. FINDINGS: Stable cardiomediastinal silhouette with top-normal heart size. No pneumothorax. No pleural effusion. Mild scarring at the lung bases, stable. No pulmonary edema. No acute consolidative airspace disease. IMPRESSION: No active cardiopulmonary disease.  Stable mild bibasilar scarring. Electronically Signed   By: Delbert Phenix M.D.   On: 01/30/2017 21:42    Cardiac Studies   Left Heart Cath and Coronary Angiography 01/30/17   Conclusion     Ost LAD to Prox LAD lesion, 90 %stenosed.  Prox LAD to Mid LAD lesion, 85 %stenosed.  Prox Cx to Mid Cx lesion,  100 %stenosed.  Ost 1st Mrg to 1st Mrg lesion, 95 %stenosed.  There is mild left ventricular systolic dysfunction.  LV end diastolic pressure is normal.  The left ventricular ejection fraction is 45-50% by visual estimate.  A STENT VISION RX 3.0X23 bare metal stent was successfully placed.  Mid RCA lesion, 100 %stenosed.  Post intervention, there is a 0% residual stenosis.   1. Severe 3 vessel obstructive CAD    - 90% focal proximal LAD    - 85% segmental LAD following the first diagonal    - 95% ostial first OM    - 100% mid LCx. Left to left collaterals to OM2 and OM3    - 100% mid RCA- this is the culprit lesion 2. Mild LV dysfunction with inferior wall motion abnormality. 3. Normal LVEDP 4. Successful stenting of the mid RCA with DES  Plan: would continue DAPT for one month. Resume IV heparin for Afib. May resume Xarelto tomorrow pm if no complications. Given advanced 3 vessel disease I would recommend CABG once she has recovered from her infarct in 4-6 weeks. Aggressive risk factor modification.     Patient Profile     76 y.o. female history of peripheral arterial disease and coronary artery disease. Other problems include hypertension and hyperlipidemia. She does have atrial fibrillation on oral" status post cardioversion by Dr. Royann Shivers 08/10/16 successfully but ultimately have recurrent A. fib. She was admitted on 01/30/17 with an inferior STEMI and underwent PCI and bare metal stenting using a vision stent by Dr. [place] radially. She had high-grade LAD and a total AV groove circumflex with preserved LV function. The plan was to treat her with dual antibiotic therapy as well as Xarelto for her A. fib for one month and then stop the P2Y12 and consider bypass surgery. She currently is on low-dose aspirin, Brilenta and Xarelto. I believe given her age we will transition her to Plavix. She also is complaining of some nausea which may be due to her Brilenta, her only new  drug.  Assessment & Plan    1: Inferior STEMI-postop day #2 inferior STEMI treated with PCI and bare-metal stenting by Dr. [place]. She does have a total AV groove circumflex and high-grade LAD disease with preserved LV function. She is on aspirin and Brilenta.  Her peak troponin was 30. The plan was to give her one month of dual antibiotic therapy and consider elective bypass surgery after that. She is nauseated which may be related to [place]. Given the fact that she will also need to be on Xarelto for her A. fib I favor transitioning from Brilenta to Plavix.  2: Peripheral arterial disease-stable  3: Hypertension-her. Blood pressures in the 160-170 range. She is somewhat nauseated and has been unable to keep down her lisinopril and beta blocker. We will put her on low-dose IV nitroglycerin to treat her hypertension until she she's able to take by mouth medications.  We'll mobilize her and make sure that she spends some time out of bed in a chair today. Cardiac rehabilitation for ambulation. We'll keep her in the unit for observation for the next 24-48 hours until she is able to take by mouth.  Alphonsus Sias, MD  02/01/2017, 7:56 AM

## 2017-02-01 NOTE — Progress Notes (Signed)
Dr.Christopher made aware of patient's nausea with vomiting up emesis after night time medication and concern over blood pressure and the increase of PVC's, MD will be up to see patient soon.

## 2017-02-01 NOTE — Progress Notes (Signed)
CARDIAC REHAB PHASE I   PRE:  Rate/Rhythm: 93 a fib  BP:  Sitting: 172/114        SaO2: 93 RA  MODE:  Ambulation: to chair   POST:  Rate/Rhythm: 118 a fib  BP:  Sitting: 184/132         SaO2: 93 RA  Pt in bed, very sleepy, difficulty keeping eyes open during conversation. Pt agreeable to attempt to walk, states she lives at home with her husband, independent prior to admission. Pt required moderate assistance to stand, very weak, mildly unsteady gait. Pt states "I don't think I can walk." Pt assisted to chair, assist x2. Pt BP elevated, RN aware. Pt set up for breakfast, feet elevated, call bell within reach. Will follow.    [id number] Joylene Grapes, RN, BSN 02/01/2017 9:20 AM

## 2017-02-01 NOTE — Progress Notes (Signed)
SBP still running 150-160s s/p nitro started and increased to . Orders received for hydralazine

## 2017-02-02 ENCOUNTER — Inpatient Hospital Stay (HOSPITAL_COMMUNITY): Payer: Medicare HMO

## 2017-02-02 DIAGNOSIS — R072 Precordial pain: Secondary | ICD-10-CM

## 2017-02-02 DIAGNOSIS — I340 Nonrheumatic mitral (valve) insufficiency: Secondary | ICD-10-CM

## 2017-02-02 LAB — BASIC METABOLIC PANEL
ANION GAP: 9 (ref 5–15)
BUN: 18 mg/dL (ref 6–20)
CALCIUM: 8.6 mg/dL — AB (ref 8.9–10.3)
CO2: 23 mmol/L (ref 22–32)
CREATININE: 0.92 mg/dL (ref 0.44–1.00)
Chloride: 103 mmol/L (ref 101–111)
GFR calc non Af Amer: 59 mL/min — ABNORMAL LOW (ref >60)
Glucose, Bld: 117 mg/dL — ABNORMAL HIGH (ref 65–99)
Potassium: 3.3 mmol/L — ABNORMAL LOW (ref 3.5–5.1)
SODIUM: 135 mmol/L (ref 135–145)

## 2017-02-02 LAB — ECHOCARDIOGRAM COMPLETE
HEIGHTINCHES: 66 in
Weight: 3047.64 oz

## 2017-02-02 LAB — URINALYSIS, ROUTINE W REFLEX MICROSCOPIC
BILIRUBIN URINE: NEGATIVE
GLUCOSE, UA: NEGATIVE mg/dL
KETONES UR: NEGATIVE mg/dL
LEUKOCYTES UA: NEGATIVE
NITRITE: NEGATIVE
PH: 6 (ref 5.0–8.0)
Protein, ur: NEGATIVE mg/dL
SPECIFIC GRAVITY, URINE: 1.012 (ref 1.005–1.030)

## 2017-02-02 LAB — GLUCOSE, CAPILLARY
GLUCOSE-CAPILLARY: 110 mg/dL — AB (ref 65–99)
GLUCOSE-CAPILLARY: 155 mg/dL — AB (ref 65–99)
GLUCOSE-CAPILLARY: 142 mg/dL — AB (ref 65–99)
GLUCOSE-CAPILLARY: 134 mg/dL — AB (ref 65–99)
Glucose-Capillary: 126 mg/dL — ABNORMAL HIGH (ref 65–99)

## 2017-02-02 LAB — POCT ACTIVATED CLOTTING TIME: Activated Clotting Time: 384 seconds

## 2017-02-02 LAB — HEMOGLOBIN A1C
Hgb A1c MFr Bld: 5.7 % — ABNORMAL HIGH (ref 4.8–5.6)
MEAN PLASMA GLUCOSE: 117 mg/dL

## 2017-02-02 NOTE — Care Management Note (Signed)
Case Management Note  Patient Details  Name: Christy Campbell MRN: [id number] Date of Birth: Aug 21, 1940  Subjective/Objective:   STEMI                 Action/Plan:   PTA independent from home.  Pt is now s/p  placement of a bare metal stent - pt already on Xarelto at home.  CM will continue to follow for discharge.     Expected Discharge Date:  02/10/17               Expected Discharge Plan:  Home/Self Care  In-House Referral:     Discharge planning Services  CM Consult  Post Acute Care Choice:    Choice offered to:     DME Arranged:    DME Agency:     HH Arranged:    HH Agency:     Status of Service:     If discussed at [employer], dates discussed:    Additional Comments:  Cherylann Parr, RN 02/02/2017, 3:46 PM

## 2017-02-02 NOTE — Plan of Care (Signed)
Problem: Education: Goal: Understanding of cardiac disease, CV risk reduction, and recovery process will improve Outcome: Progressing Provided pt with cardiac surgery booklet regarding pending CABG procedure.

## 2017-02-02 NOTE — Progress Notes (Signed)
Progress Note  Patient Name: Christy Campbell Date of Encounter: 02/02/2017  Primary Cardiologist: Allyson Sabal  Subjective   [name] is postop day 3 inferior STEMI and placement of a bare metal stent by Dr. [place]. She denies chest pain And her nausea has significant improved since yesterday.Marland Kitchen Her blood pressure has improved since yesterday as well.  Inpatient Medications    Scheduled Meds: . aspirin EC  81 mg Oral Daily  . atorvastatin  80 mg Oral q1800  . clopidogrel  75 mg Oral Daily  . lisinopril  20 mg Oral BID  . metoprolol tartrate  50 mg Oral BID  . omega-3 acid ethyl esters  1 g Oral Daily  . rivaroxaban  20 mg Oral Q supper  . rOPINIRole  0.25 mg Oral QHS  . sodium chloride flush  3 mL Intravenous Q12H   Continuous Infusions: . sodium chloride Stopped (01/31/17 0000)  . sodium chloride Stopped (02/02/17 0400)  . nitroGLYCERIN Stopped (02/02/17 0400)   PRN Meds: sodium chloride, acetaminophen, guaiFENesin-dextromethorphan, hydrALAZINE, nitroGLYCERIN, ondansetron (ZOFRAN) IV, promethazine, sodium chloride flush   Vital Signs    Vitals:   02/02/17 0400 02/02/17 0500 02/02/17 0600 02/02/17 0700  BP: 90/63 (!) 135/98 (!) 158/104 (!) 161/107  Pulse: (!) 104 90 (!) 50 (!) 51  Resp: (!) 24 20 19  (!) 25  Temp: 100 F (37.8 C)   98.6 F (37 C)  TempSrc: Oral   Oral  SpO2: 91% 94% 93% 95%  Weight: 190 lb 7.6 oz (86.4 kg)     Height:        Intake/Output Summary (Last 24 hours) at 02/02/17 0851 Last data filed at 02/02/17 0400  Gross per 24 hour  Intake           930.63 ml  Output             1025 ml  Net           -94.37 ml   Filed Weights   01/30/17 1846 01/30/17 2300 02/02/17 0400  Weight: 185 lb (83.9 kg) 194 lb 10.7 oz (88.3 kg) 190 lb 7.6 oz (86.4 kg)    Telemetry    Atrial fibrillation with a ventricular response in the low 100 range - Personally Reviewed  ECG    Atrial fibrillation with a ventricular response of 103, left intrafascicular  block. Inferior Q waves with inferolateral T-wave inversion. - Personally Reviewed  Physical Exam   GEN: No acute distress.   Neck: No JVD Cardiac:  irregularly irregular, no murmurs, rubs, or gallops.  Respiratory: Clear to auscultation bilaterally. GI: Soft, nontender, non-distended . Active bowel sounds MS: No edema; No deformity. Neuro:  Nonfocal  Psych: Normal affect  Extremity: Right radial puncture site intact   Labs    Chemistry  Recent Labs Lab 01/30/17 1844 01/31/17 0343 02/01/17 0014  NA 137 136 133*  K 4.4 3.7 3.4*  CL 105 106 103  CO2 22 20* 22  GLUCOSE 145* 118* 122*  BUN 28* 26* 20  CREATININE 1.23* 1.08* 0.96  CALCIUM 9.6 8.8* 9.0  PROT 6.2*  --   --   ALBUMIN 3.8  --   --   AST 122*  --   --   ALT 87*  --   --   ALKPHOS 106  --   --   BILITOT 0.8  --   --   GFRNONAA 42* 49* 56*  GFRAA 48* 57* >60  ANIONGAP 10 10 8  Hematology  Recent Labs Lab 01/30/17 1844 01/31/17 0343  WBC 15.5* 12.8*  RBC 5.11 4.82  HGB 14.1 13.0  HCT 42.3 39.5  MCV 82.8 82.0  MCH 27.6 27.0  MCHC 33.3 32.9  RDW 16.1* 16.0*  PLT 315 259    Cardiac Enzymes  Recent Labs Lab 01/31/17 0343 01/31/17 0933 01/31/17 1347 02/01/17 0014  TROPONINI 24.87* 30.19* 30.16* 21.79*     Recent Labs Lab 01/30/17 2110  TROPIPOC 3.20*     BNPNo results for input(s): BNP, PROBNP in the last 168 hours.   DDimer No results for input(s): DDIMER in the last 168 hours.   Radiology    No results found.  Cardiac Studies   Left Heart Cath and Coronary Angiography 01/30/17   Conclusion     Ost LAD to Prox LAD lesion, 90 %stenosed.  Prox LAD to Mid LAD lesion, 85 %stenosed.  Prox Cx to Mid Cx lesion, 100 %stenosed.  Ost 1st Mrg to 1st Mrg lesion, 95 %stenosed.  There is mild left ventricular systolic dysfunction.  LV end diastolic pressure is normal.  The left ventricular ejection fraction is 45-50% by visual estimate.  A STENT VISION RX 3.0X23 bare metal  stent was successfully placed.  Mid RCA lesion, 100 %stenosed.  Post intervention, there is a 0% residual stenosis.   1. Severe 3 vessel obstructive CAD    - 90% focal proximal LAD    - 85% segmental LAD following the first diagonal    - 95% ostial first OM    - 100% mid LCx. Left to left collaterals to OM2 and OM3    - 100% mid RCA- this is the culprit lesion 2. Mild LV dysfunction with inferior wall motion abnormality. 3. Normal LVEDP 4. Successful stenting of the mid RCA with DES  Plan: would continue DAPT for one month. Resume IV heparin for Afib. May resume Xarelto tomorrow pm if no complications. Given advanced 3 vessel disease I would recommend CABG once she has recovered from her infarct in 4-6 weeks. Aggressive risk factor modification.     Patient Profile     76 y.o. female history of peripheral arterial disease and coronary artery disease. Other problems include hypertension and hyperlipidemia. She does have atrial fibrillation on oral" status post cardioversion by Dr. Royann Shivers 08/10/16 successfully but ultimately have recurrent A. fib. She was admitted on 01/30/17 with an inferior STEMI and underwent PCI and bare metal stenting using a vision stent by Dr. [place] radially. She had high-grade LAD and a total AV groove circumflex with preserved LV function. The plan was to treat her with dual Antiplatelet therapy as well as Xarelto for her A. fib for one month and then stop the P2Y12 and consider bypass surgery. She currently is on low-dose aspirin, Plavix and Xarelto. I believe given her age we will transition her to Plavix. She was complaining of nausea yesterday which has since resolved after changing her Brilenta to Plavix.  Assessment & Plan    1: Inferior STEMI-postop day #3 inferior STEMI treated with PCI and bare-metal stenting by Dr. [place]. She does have a total AV groove circumflex and high-grade LAD disease with preserved LV function. She is on aspirin and Brilenta. Her  peak troponin was 30. The plan was to give her one month of dual antibiotic therapy and consider elective bypass surgery after that. I changed her Brilenta the Plavix yesterday because of nausea which has since resolved.  2: Peripheral arterial disease-stable  3: Hypertension-her. Blood pressures  in the 160-170 range. She is somewhat nauseated and has been unable to keep down her lisinopril and beta blocker. We will put her on low-dose IV nitroglycerin to treat her hypertension until she she's able to take by mouth medications.   4: Atrial fibrillation-heart rate controlled in the 90-100 range. She is on Xarelto.  She feels clinically improved this morning and is up in chair eating breakfast. Her blood pressure is better controlled. She denies chest pain or shortness of breath. I'm going to transfer her to telemetry and allow her to ambulate with cardiac rehabilitation with anticipation to be discharged in the next 24-48 hours.   Alphonsus Sias, MD  02/02/2017, 8:51 AM

## 2017-02-02 NOTE — Progress Notes (Signed)
  Echocardiogram 2D Echocardiogram has been performed.  Janalyn Harder 02/02/2017, 9:57 AM

## 2017-02-02 NOTE — Progress Notes (Signed)
CARDIAC REHAB PHASE I   PRE:  Rate/Rhythm: 104 afib  BP:  Supine: 117/78  Sitting:   Standing:    SaO2: 95% RA  MODE:  Ambulation: 80 ft   POST:  Rate/Rhythm: 125-133 afib  BP:  Supine:   Sitting: 131/103  Standing:    SaO2: 96%RA 1132-1202 Pt assisted to bathroom and then walked 80 ft with rolling walker. Gait fairly steady. Stated she has rollator at home she can use. No CP. To recliner after walk. BP and HR elevated. Pt did not want to see pre op video. Left pre op booklet and care guide. Discussed sternal precautions and importance of mobility after surgery. She stated her husband would be able to stay with her after surgery. Gave MI booklet and reviewed NTG use.    Luetta Nutting, RN BSN  02/02/2017 11:59 AM

## 2017-02-02 NOTE — Discharge Summary (Signed)
Discharge Summary    Patient ID: Christy Campbell,  MRN: [id number], DOB/AGE: 76-Jun-1942 76 y.o.  Admit date: 01/30/2017 Discharge date: 02/04/2017  Primary Care Provider: Pc, [facility] Primary Cardiologist: Christy Campbell   Discharge Diagnoses    Active Problems:   Acute ST elevation myocardial infarction (STEMI) of inferior wall (HCC)  HTN  HLD  PVD  Restless leg syndrome  Tobacco abuse   Allergies Allergies  Allergen Reactions  . Aspirin Nausea And Vomiting and Other (See Comments)    "pain; hurting"  . Tetanus Toxoids Anaphylaxis and Swelling  . Phenergan [Promethazine Hcl] Other (See Comments)    Severe confusion  . Pravastatin     myalgia    Diagnostic Studies/Procedures    LHC: 01/30/17  Conclusion     Ost LAD to Prox LAD lesion, 90 %stenosed.  Prox LAD to Mid LAD lesion, 85 %stenosed.  Prox Cx to Mid Cx lesion, 100 %stenosed.  Ost 1st Mrg to 1st Mrg lesion, 95 %stenosed.  There is mild left ventricular systolic dysfunction.  LV end diastolic pressure is normal.  The left ventricular ejection fraction is 45-50% by visual estimate.  A STENT VISION RX 3.0X23 bare metal stent was successfully placed.  Mid RCA lesion, 100 %stenosed.  Post intervention, there is a 0% residual stenosis.   1. Severe 3 vessel obstructive CAD    - 90% focal proximal LAD    - 85% segmental LAD following the first diagonal    - 95% ostial first OM    - 100% mid LCx. Left to left collaterals to OM2 and OM3    - 100% mid RCA- this is the culprit lesion 2. Mild LV dysfunction with inferior wall motion abnormality. 3. Normal LVEDP 4. Successful stenting of the mid RCA with DES  Plan: would continue DAPT for one month. Resume IV heparin for Afib. May resume Xarelto tomorrow pm if no complications. Given advanced 3 vessel disease I would recommend CABG once she has recovered from her infarct in 4-6 weeks. Aggressive risk factor modification.   TTE:  02/02/17  Study Conclusions  - Left ventricle: The cavity size was normal. Wall thickness was   increased in a pattern of mild LVH. Systolic function was mildly   reduced. The estimated ejection fraction was in the range of 45%   to 50%. There is hypokinesis of the basal-midinferior myocardium. - Aortic valve: Trileaflet; mildly thickened, mildly calcified   leaflets. - Mitral valve: There was moderate regurgitation. - Left atrium: The atrium was mildly dilated. - Right atrium: The atrium was mildly dilated. - Tricuspid valve: There was moderate regurgitation directed   eccentrically and toward the septum. - Pulmonary arteries: Systolic pressure was moderately increased.   PA peak pressure: 52 mm Hg (S) _____________   History of Present Illness     Christy Campbell 76 y.o. female with a history of chronic tobacco abuse, HTN, dyslipidemia and PVD with s/p multiple PTA procedures including right common iliac artery stent, left SFA stent, left SFA directional atherectomy 2013, abnormal nuclear stress test 07/2014 with mild anteroapical ischemia with no angina and persistent atrial fibrillation s/p DCCV 07/2016 on Xarelto. She was last seen by Dr. Allyson Campbell in 08/2016 and was noted to be back in atrial fibrillation and it was decided to pursue rate control.  She denied any claudication symptoms at that time. Vascular dopplers 11/2016 showed patent right common iliac artery stent, stable >50% stenosis in the in the left common iliac  artery (low end of range), stable >50% stenosis in the bilateral external iliac arteries (low end of range), new 50-99% stenosis in the right mid to distal SFA, s/p angioplasty and patent left SFA stent. She was seen back by Dr. Allyson Campbell last month and medical management was continued as she had no claudication symptoms.    She was in her USOH until 01/29/17 when she started having intermittent upper abdominal pain under her breasts across her chest.  This was intermittent that  night but the day of admission became fairly constant and was associated with nausea.  There was also some mild SOB. She denied any diaphoresis.  She said that her epigastric discomfort was radiating into her neck and rated a 5/10.  The only thing that made it better was lying down.  In ER EKG showed mild ST elevation in the inferior leads with reciprocal changes in I and aVL that was new from prior EKG.  Troponin 3.2 and code STEMI called.  Other notable findings that were found were creatinine 1.23, and WBC 15.5. Cardiology was asked to admit.   Hospital Course     Consultants: None   She was admitted and underwent LHC with Dr. Swaziland noted above with successful PCI and BMS of the mRCA with residual disease (90% pLAD, 95% first OM, 100% mLCx with collaterals) and mild LV dysfunction in the inferior wall. Normal LVEDP. She was placed on DAPT with ASA/Brilinta. Post cath remained nauseated and Brilinta was switched Plavix (also required triple therapy) which seemed to help. Also plan for triple therapy with ASA/plavix/Xarelto.   Her blood pressure was soft at admission then remained elevated and she was placed on IV nitro for period of time as she was unable to keep oral medications down. Her home metoprolol and lisinopril were resumed with improvement in BP once able to take POs. Post cath labs showed stabilized Cr and Hgb 13.0. Troponin peaked at 30.19. LDL 85. Hgb A1c 5.7. Remained in AFib throughout admission with rate controlled at 100s. Discontinued cardizem given mild LV dysfunction. up titrated BB.   01/30/2017: Cholesterol 142; HDL 41; LDL Cholesterol 85; Triglycerides 80; VLDL 16. Lipitor started this admission. Consider OP f/u labs 6-8 weeks given statin initiation this admission.  Her plan is to give her one month of dual antibiotic therapy and consider elective bypass surgery after that. No TCM appointment available. Her urine culture grown >100K Ecoli. No treatment as patient was  asymptomatic. The patient will follow up with PCP if dysuria or polyuria.   Patient had restless leg syndrome and started on Requip with improvement. F/u with PCP,.   Discharge Vitals Blood pressure 107/80, pulse (!) 117, temperature 98.4 F (36.9 C), temperature source Oral, resp. rate (!) 0, height 5\' 6"  (1.676 m), weight 195 lb (88.5 kg), SpO2 97 %.  Filed Weights   02/02/17 0400 02/03/17 0454 02/04/17 0533  Weight: 190 lb 7.6 oz (86.4 kg) 193 lb 4.8 oz (87.7 kg) 195 lb (88.5 kg)    Labs & Radiologic Studies    CBC No results for input(s): WBC, NEUTROABS, HGB, HCT, MCV, PLT in the last 72 hours. Basic Metabolic Panel  Recent Labs  02/02/17 0807  NA 135  K 3.3*  CL 103  CO2 23  GLUCOSE 117*  BUN 18  CREATININE 0.92  CALCIUM 8.6*   Liver _____________  Dg Chest Port 1 View  Result Date: 01/30/2017 CLINICAL DATA:  Epigastric abdominal pain EXAM: PORTABLE CHEST 1 VIEW COMPARISON:  06/19/2016  chest radiograph. FINDINGS: Stable cardiomediastinal silhouette with top-normal heart size. No pneumothorax. No pleural effusion. Mild scarring at the lung bases, stable. No pulmonary edema. No acute consolidative airspace disease. IMPRESSION: No active cardiopulmonary disease.  Stable mild bibasilar scarring. Electronically Signed   By: Delbert Phenix M.D.   On: 01/30/2017 21:42   Disposition   Pt is being discharged home today in good condition.  Follow-up Plans & Appointments    Follow-up Information    Abelino Derrick, [place]. Go on 02/19/2017.   Specialties:  Cardiology, Radiology Why:  @8 :30 for post hospital  Contact information: [address] [place] [address] [phone]        Runell Gess, MD. Go on 05/07/2017.   Specialties:  Cardiology, Radiology Why:  @10 :15 for follow up Contact information: [address] [place] [address] [phone]        Pc, [facility]. Schedule an appointment as soon as possible  for a visit in 4 day(s).   Why:  for post hospital follow up  Contact information: [address] [place] [address] [phone]          Discharge Instructions    AMB Referral to Cardiac Rehabilitation - Phase II    Complete by:  As directed    Diagnosis:  STEMI   Diet - low sodium heart healthy    Complete by:  As directed    Discharge instructions    Complete by:  As directed    No driving for 2 weeks. No lifting over 10 lbs for 4 weeks. No sexual activity for 4 weeks. You may not return to work until cleared by your cardiologist. Keep procedure site clean & dry. If you notice increased pain, swelling, bleeding or pus, call/return!  You may shower, but no soaking baths/hot tubs/pools for 1 week.   Increase activity slowly    Complete by:  As directed       Discharge Medications   Current Discharge Medication List    START taking these medications   Details  aspirin EC 81 MG EC tablet Take 1 tablet (81 mg total) by mouth daily. Qty: 30 tablet, Refills: 0    atorvastatin (LIPITOR) 80 MG tablet Take 1 tablet (80 mg total) by mouth daily at 6 PM. Qty: 30 tablet, Refills: 6    clopidogrel (PLAVIX) 75 MG tablet Take 1 tablet (75 mg total) by mouth daily. Qty: 30 tablet, Refills: 11    HYDROcodone-homatropine (HYCODAN) 5-1.5 MG/5ML syrup Take 5 mLs by mouth every 6 (six) hours. Qty: 120 mL, Refills: 0    nitroGLYCERIN (NITROSTAT) 0.4 MG SL tablet Place 1 tablet (0.4 mg total) under the tongue every 5 (five) minutes x 3 doses as needed for chest pain. Qty: 25 tablet, Refills: 12    rOPINIRole (REQUIP) 0.25 MG tablet Take 1 tablet (0.25 mg total) by mouth at bedtime. Qty: 30 tablet, Refills: 1      CONTINUE these medications which have CHANGED   Details  lisinopril (PRINIVIL,ZESTRIL) 20 MG tablet Take 1 tablet (20 mg total) by mouth 2 (two) times daily. Qty: 60 tablet, Refills: 6    metoprolol tartrate (LOPRESSOR) 100 MG tablet Take 1 tablet (100 mg total) by mouth 2  (two) times daily. Qty: 60 tablet, Refills: 6      CONTINUE these medications which have NOT CHANGED   Details  acetaminophen (TYLENOL) 325 MG tablet Take 325 mg by mouth 2 (two) times daily as  needed. For pain    Multiple Vitamin (MULITIVITAMIN WITH MINERALS) TABS Take 1 tablet by mouth daily.    omega-3 acid ethyl esters (LOVAZA) 1 G capsule Take 1 g by mouth daily.     rivaroxaban (XARELTO) 20 MG TABS tablet Take 1 tablet (20 mg total) by mouth daily with supper. Qty: 30 tablet, Refills: 3    vitamin C (ASCORBIC ACID) 500 MG tablet Take 500 mg by mouth daily.      STOP taking these medications     diltiazem (DILACOR XR) 240 MG 24 hr capsule      Garlic 10 MG CAPS      hydrALAZINE (APRESOLINE) 10 MG tablet      hydrochlorothiazide (HYDRODIURIL) 25 MG tablet          Aspirin prescribed at discharge?  Yes High Intensity Statin Prescribed? (Lipitor 40-80mg  or Crestor 20-40mg ): Yes Beta Blocker Prescribed? Yes For EF <40%, was ACEI/ARB Prescribed? Yes ADP Receptor Inhibitor Prescribed? (i.e. Plavix etc.-Includes Medically Managed Patients): Yes For EF <40%, Aldosterone Inhibitor Prescribed? N./A Was EF assessed during THIS hospitalization? Yes Was Cardiac Rehab II ordered? (Included Medically managed Patients): Yes   Outstanding Labs/Studies   Consider OP f/u labs 6-8 weeks given statin initiation this admission.  Duration of Discharge Encounter   Greater than 30 minutes including physician time.  Signed, Keyah Blizard, PA-C .ti

## 2017-02-03 LAB — GLUCOSE, CAPILLARY
Glucose-Capillary: 100 mg/dL — ABNORMAL HIGH (ref 65–99)
Glucose-Capillary: 148 mg/dL — ABNORMAL HIGH (ref 65–99)
Glucose-Capillary: 122 mg/dL — ABNORMAL HIGH (ref 65–99)
Glucose-Capillary: 122 mg/dL — ABNORMAL HIGH (ref 65–99)

## 2017-02-03 MED ORDER — MAGNESIUM HYDROXIDE 400 MG/5ML PO SUSP
30.0000 mL | Freq: Every day | ORAL | Status: DC | PRN
  Administered 2017-02-04: 30 mL via ORAL
  Filled 2017-02-03: qty 30

## 2017-02-03 MED ORDER — HYDROCODONE-HOMATROPINE 5-1.5 MG/5ML PO SYRP
5.0000 mL | ORAL_SOLUTION | Freq: Four times a day (QID) | ORAL | Status: DC
  Administered 2017-02-03 – 2017-02-04 (×4): 5 mL via ORAL
  Filled 2017-02-03 (×4): qty 5

## 2017-02-03 MED ORDER — METOPROLOL TARTRATE 25 MG PO TABS
25.0000 mg | ORAL_TABLET | Freq: Once | ORAL | Status: AC
  Administered 2017-02-03: 25 mg via ORAL
  Filled 2017-02-03: qty 1

## 2017-02-03 MED ORDER — HYDROCODONE-HOMATROPINE 5-1.5 MG/5ML PO SYRP
5.0000 mL | ORAL_SOLUTION | ORAL | Status: DC | PRN
  Administered 2017-02-03 (×2): 5 mL via ORAL
  Filled 2017-02-03 (×2): qty 5

## 2017-02-03 MED ORDER — POTASSIUM CHLORIDE CRYS ER 20 MEQ PO TBCR
40.0000 meq | EXTENDED_RELEASE_TABLET | Freq: Once | ORAL | Status: AC
  Administered 2017-02-03: 40 meq via ORAL

## 2017-02-03 MED ORDER — METOPROLOL TARTRATE 25 MG PO TABS
75.0000 mg | ORAL_TABLET | Freq: Two times a day (BID) | ORAL | Status: DC
  Administered 2017-02-03 – 2017-02-04 (×2): 75 mg via ORAL
  Filled 2017-02-03 (×2): qty 1

## 2017-02-03 NOTE — Care Management Important Message (Signed)
Important Message  Patient Details  Name: Christy Campbell MRN: [id number] Date of Birth: Aug 07, 1940   Medicare Important Message Given:  Yes    [name] [name] 02/03/2017, 10:15 AM

## 2017-02-03 NOTE — Progress Notes (Signed)
CARDIAC REHAB PHASE I   PRE:  Rate/Rhythm: 81 a fib  BP:  Sitting: 127/89        SaO2: 96 RA  MODE:  Ambulation: 70 ft   POST:  Rate/Rhythm: 128 a fib  BP:  Sitting: 148/98         SaO2: 93 RA  Pt ambulated 70 ft on RA, rolling walker, gait belt, assist x1, slow, mostly steady gait, tolerated fair. Pt c/o DOE, weakness in her legs, denies any other complaints, brief standing rest x1. BP improved but still somewhat elevated. Pt appreciative of walk, to bed per pt request, call bell within reach. Will follow.    [phone] Joylene Grapes, RN, BSN 02/03/2017 1:45 PM

## 2017-02-03 NOTE — Progress Notes (Signed)
Progress Note  Patient Name: Christy Campbell Date of Encounter: 02/03/2017  Primary Cardiologist: Allyson Sabal  Subjective   No further chest pain, but with persistent cough.   Inpatient Medications    Scheduled Meds: . aspirin EC  81 mg Oral Daily  . atorvastatin  80 mg Oral q1800  . clopidogrel  75 mg Oral Daily  . lisinopril  20 mg Oral BID  . metoprolol tartrate  50 mg Oral BID  . omega-3 acid ethyl esters  1 g Oral Daily  . rivaroxaban  20 mg Oral Q supper  . rOPINIRole  0.25 mg Oral QHS  . sodium chloride flush  3 mL Intravenous Q12H   Continuous Infusions: . sodium chloride Stopped (01/31/17 0000)  . sodium chloride Stopped (02/02/17 0400)  . nitroGLYCERIN Stopped (02/02/17 0400)   PRN Meds: sodium chloride, acetaminophen, guaiFENesin-dextromethorphan, hydrALAZINE, HYDROcodone-homatropine, magnesium hydroxide, nitroGLYCERIN, ondansetron (ZOFRAN) IV, sodium chloride flush   Vital Signs    Vitals:   02/02/17 2041 02/02/17 2314 02/03/17 0000 02/03/17 0454  BP: (!) 170/108 (!) 148/104  (!) 167/102  Pulse: 86 81 92 (!) 104  Resp: 19 20 13 18   Temp: 98.2 F (36.8 C)  99.2 F (37.3 C) 99.8 F (37.7 C)  TempSrc: Oral   Oral  SpO2: 97% 96% 96% 95%  Weight:    193 lb 4.8 oz (87.7 kg)  Height:        Intake/Output Summary (Last 24 hours) at 02/03/17 1110 Last data filed at 02/03/17 0848  Gross per 24 hour  Intake              600 ml  Output              600 ml  Net                0 ml   Filed Weights   01/30/17 2300 02/02/17 0400 02/03/17 0454  Weight: 194 lb 10.7 oz (88.3 kg) 190 lb 7.6 oz (86.4 kg) 193 lb 4.8 oz (87.7 kg)    Telemetry    Afib rates 90-120s - Personally Reviewed  ECG    N/a - Personally Reviewed  Physical Exam   General: Well developed, well nourished, female appearing in no acute distress. Head: Normocephalic, atraumatic.  Neck: Supple without bruits, JVD. Lungs:  Resp regular and unlabored, CTA. Heart: Irreg Irreg, S1, S2, no  S3, S4, or murmur; no rub. Abdomen: Soft, non-tender, non-distended with normoactive bowel sounds. No hepatomegaly. No rebound/guarding. No obvious abdominal masses. Extremities: No clubbing, cyanosis, edema. Distal pedal pulses are 2+ bilaterally. Neuro: Alert and oriented X 3. Moves all extremities spontaneously. Psych: Normal affect.  Labs    Chemistry Recent Labs Lab 01/30/17 1844 01/31/17 0343 02/01/17 0014 02/02/17 0807  NA 137 136 133* 135  K 4.4 3.7 3.4* 3.3*  CL 105 106 103 103  CO2 22 20* 22 23  GLUCOSE 145* 118* 122* 117*  BUN 28* 26* 20 18  CREATININE 1.23* 1.08* 0.96 0.92  CALCIUM 9.6 8.8* 9.0 8.6*  PROT 6.2*  --   --   --   ALBUMIN 3.8  --   --   --   AST 122*  --   --   --   ALT 87*  --   --   --   ALKPHOS 106  --   --   --   BILITOT 0.8  --   --   --   GFRNONAA 42* 49* 56* 59*  GFRAA 48* 57* >60 >60  ANIONGAP 10 10 8 9      Hematology Recent Labs Lab 01/30/17 1844 01/31/17 0343  WBC 15.5* 12.8*  RBC 5.11 4.82  HGB 14.1 13.0  HCT 42.3 39.5  MCV 82.8 82.0  MCH 27.6 27.0  MCHC 33.3 32.9  RDW 16.1* 16.0*  PLT 315 259    Cardiac Enzymes Recent Labs Lab 01/31/17 0343 01/31/17 0933 01/31/17 1347 02/01/17 0014  TROPONINI 24.87* 30.19* 30.16* 21.79*    Recent Labs Lab 01/30/17 2110  TROPIPOC 3.20*     BNPNo results for input(s): BNP, PROBNP in the last 168 hours.   DDimer No results for input(s): DDIMER in the last 168 hours.    Radiology    No results found.  Cardiac Studies   LHC: 01/30/17  Conclusion     Ost LAD to Prox LAD lesion, 90 %stenosed.  Prox LAD to Mid LAD lesion, 85 %stenosed.  Prox Cx to Mid Cx lesion, 100 %stenosed.  Ost 1st Mrg to 1st Mrg lesion, 95 %stenosed.  There is mild left ventricular systolic dysfunction.  LV end diastolic pressure is normal.  The left ventricular ejection fraction is 45-50% by visual estimate.  A STENT VISION RX 3.0X23 bare metal stent was successfully placed.  Mid RCA  lesion, 100 %stenosed.  Post intervention, there is a 0% residual stenosis.   1. Severe 3 vessel obstructive CAD    - 90% focal proximal LAD    - 85% segmental LAD following the first diagonal    - 95% ostial first OM    - 100% mid LCx. Left to left collaterals to OM2 and OM3    - 100% mid RCA- this is the culprit lesion 2. Mild LV dysfunction with inferior wall motion abnormality. 3. Normal LVEDP 4. Successful stenting of the mid RCA with DES  Plan: would continue DAPT for one month. Resume IV heparin for Afib. May resume Xarelto tomorrow pm if no complications. Given advanced 3 vessel disease I would recommend CABG once she has recovered from her infarct in 4-6 weeks. Aggressive risk factor modification.   TTE: 02/02/17  Study Conclusions  - Left ventricle: The cavity size was normal. Wall thickness was   increased in a pattern of mild LVH. Systolic function was mildly   reduced. The estimated ejection fraction was in the range of 45%   to 50%. There is hypokinesis of the basal-midinferior myocardium. - Aortic valve: Trileaflet; mildly thickened, mildly calcified   leaflets. - Mitral valve: There was moderate regurgitation. - Left atrium: The atrium was mildly dilated. - Right atrium: The atrium was mildly dilated. - Tricuspid valve: There was moderate regurgitation directed   eccentrically and toward the septum. - Pulmonary arteries: Systolic pressure was moderately increased.   PA peak pressure: 52 mm Hg (S).  Patient Profile     76 y.o. female history of peripheral arterial disease and coronary artery disease. Other problems include hypertension and hyperlipidemia. She does have atrial fibrillation on [facility] post cardioversion by Dr. Royann Shivers 08/10/16 successfully but ultimately had recurrent A. fib. She was admitted on 01/30/17 with an inferior STEMI and underwent PCI and bare metal stenting using a vision stent by Dr. Sw[place] radially. She had high-grade LAD and a total AV  groove circumflex with preserved LV function. The plan was to treat her with dual Antiplatelet therapy as well as Xarelto for her A. fib for one month and then stop the P2Y12 and consider bypass surgery.  Assessment & Plan  1: Inferior STEMI: postop day #4 inferior STEMI treated with PCI and bare-metal stenting by Dr. Swaziland. She does have a total AV groove circumflex and high-grade LAD disease with preserved LV functio. Her peak troponin was 30. The plan was to give her one month of DAPT and consider elective bypass surgery after that. No recurrent chest pain. -- ASA, plavix, xarelto, ACEi, BB, statin  2: Peripheral arterial disease: stable  3: Hypertension: Remains elevated. Will further increase metoprolol to 75mg  BID today.  -- continue ACEi  4: Atrial fibrillation: heart rate controlled in the 90-100 range. She is on Xarelto.  5. Hypokalemia: Replaced   Signed, Laverda Page, NP  02/03/2017, 11:10 AM     Agree with note by Laverda Page NP-C  Stable today, probably ready for discharge tomorrow.  Runell Gess, M.D., FACP, [facility], [facility] [address] [place], [place]  [address]  [phone] 02/04/2017 10:52 AM

## 2017-02-03 NOTE — Progress Notes (Signed)
Paged on-call for Cardiology per pt request for a stronger cough medicine. Verbal order received for PO Hycodan cough syrup. Will continue to monitor and assess.  Viviano Simas, RN

## 2017-02-04 LAB — URINE CULTURE: Culture: >=100000 — AB

## 2017-02-04 LAB — GLUCOSE, CAPILLARY
GLUCOSE-CAPILLARY: 121 mg/dL — AB (ref 65–99)
Glucose-Capillary: 100 mg/dL — ABNORMAL HIGH (ref 65–99)

## 2017-02-04 MED ORDER — HYDROCODONE-HOMATROPINE 5-1.5 MG/5ML PO SYRP: 5.0000 mL | ORAL_SOLUTION | Freq: Four times a day (QID) | ORAL | 0 refills | Status: DC

## 2017-02-04 MED ORDER — ATORVASTATIN CALCIUM 80 MG PO TABS: 80.0000 mg | ORAL_TABLET | Freq: Every day | ORAL | 6 refills | Status: DC

## 2017-02-04 MED ORDER — LISINOPRIL 20 MG PO TABS: 20.0000 mg | ORAL_TABLET | Freq: Two times a day (BID) | ORAL | 6 refills | Status: DC

## 2017-02-04 MED ORDER — CLOPIDOGREL BISULFATE 75 MG PO TABS: 75.0000 mg | ORAL_TABLET | Freq: Every day | ORAL | 11 refills | Status: DC

## 2017-02-04 MED ORDER — ASPIRIN 81 MG PO TBEC: 81.0000 mg | DELAYED_RELEASE_TABLET | Freq: Every day | ORAL | 0 refills | Status: DC

## 2017-02-04 MED ORDER — ROPINIROLE HCL 0.25 MG PO TABS: 0.2500 mg | ORAL_TABLET | Freq: Every day | ORAL | 1 refills | Status: DC

## 2017-02-04 MED ORDER — METOPROLOL TARTRATE 100 MG PO TABS: 100.0000 mg | ORAL_TABLET | Freq: Two times a day (BID) | ORAL | 6 refills | Status: AC

## 2017-02-04 MED ORDER — NITROGLYCERIN 0.4 MG SL SUBL: 0.4000 mg | SUBLINGUAL_TABLET | SUBLINGUAL | 12 refills | Status: DC | PRN

## 2017-02-04 NOTE — Discharge Instructions (Signed)

## 2017-02-04 NOTE — Progress Notes (Signed)
Progress Note  Patient Name: Christy Campbell Date of Encounter: 02/04/2017  Primary Cardiologist: Allyson Sabal  Subjective   Christy Campbell is postop day 4  inferior STEMI and placement of a bare metal stent by Dr. Swaziland. She denies chest pain or shortness of breath. She remains in atrial fibrillation with a mildly elevated ventricular response.    Inpatient Medications    Scheduled Meds: . aspirin EC  81 mg Oral Daily  . atorvastatin  80 mg Oral q1800  . clopidogrel  75 mg Oral Daily  . HYDROcodone-homatropine  5 mL Oral Q6H  . lisinopril  20 mg Oral BID  . metoprolol tartrate  75 mg Oral BID  . omega-3 acid ethyl esters  1 g Oral Daily  . rivaroxaban  20 mg Oral Q supper  . rOPINIRole  0.25 mg Oral QHS  . sodium chloride flush  3 mL Intravenous Q12H   Continuous Infusions: . sodium chloride Stopped (01/31/17 0000)  . sodium chloride Stopped (02/02/17 0400)  . nitroGLYCERIN Stopped (02/02/17 0400)   PRN Meds: sodium chloride, acetaminophen, hydrALAZINE, magnesium hydroxide, nitroGLYCERIN, ondansetron (ZOFRAN) IV, sodium chloride flush   Vital Signs    Vitals:   02/03/17 1327 02/03/17 2049 02/04/17 0533 02/04/17 0605  BP: (!) 148/98 (!) 136/91  107/80  Pulse: 93 96 (!) 117   Resp: (!) 21 20 (!) 22   Temp: 98 F (36.7 C) 98.9 F (37.2 C) 98.4 F (36.9 C)   TempSrc: Oral Oral Oral   SpO2: 98% 97%    Weight:   195 lb (88.5 kg)   Height:        Intake/Output Summary (Last 24 hours) at 02/04/17 1034 Last data filed at 02/04/17 0125  Gross per 24 hour  Intake              840 ml  Output              200 ml  Net              640 ml   Filed Weights   02/02/17 0400 02/03/17 0454 02/04/17 0533  Weight: 190 lb 7.6 oz (86.4 kg) 193 lb 4.8 oz (87.7 kg) 195 lb (88.5 kg)    Telemetry    Atrial fibrillation with a ventricular response in the low 100 range - Personally Reviewed  ECG    Atrial fibrillation with a ventricular response of 103, left intrafascicular block.  Inferior Q waves with inferolateral T-wave inversion. - Personally Reviewed  Physical Exam   GEN: No acute distress.   Neck: No JVD Cardiac: irregularly irregular, no murmurs, rubs, or gallops.  Respiratory: Clear to auscultation bilaterally. GI: Soft, nontender, non-distended . Active bowel sounds Christy: No edema; No deformity. Neuro:  Nonfocal  Psych: Normal affect  Extremity: Right radial puncture site intact   Labs    Chemistry  Recent Labs Lab 01/30/17 1844 01/31/17 0343 02/01/17 0014 02/02/17 0807  NA 137 136 133* 135  K 4.4 3.7 3.4* 3.3*  CL 105 106 103 103  CO2 22 20* 22 23  GLUCOSE 145* 118* 122* 117*  BUN 28* 26* 20 18  CREATININE 1.23* 1.08* 0.96 0.92  CALCIUM 9.6 8.8* 9.0 8.6*  PROT 6.2*  --   --   --   ALBUMIN 3.8  --   --   --   AST 122*  --   --   --   ALT 87*  --   --   --  ALKPHOS 106  --   --   --   BILITOT 0.8  --   --   --   GFRNONAA 42* 49* 56* 59*  GFRAA 48* 57* >60 >60  ANIONGAP 10 10 8 9      Hematology  Recent Labs Lab 01/30/17 1844 01/31/17 0343  WBC 15.5* 12.8*  RBC 5.11 4.82  HGB 14.1 13.0  HCT 42.3 39.5  MCV 82.8 82.0  MCH 27.6 27.0  MCHC 33.3 32.9  RDW 16.1* 16.0*  PLT 315 259    Cardiac Enzymes  Recent Labs Lab 01/31/17 0343 01/31/17 0933 01/31/17 1347 02/01/17 0014  TROPONINI 24.87* 30.19* 30.16* 21.79*     Recent Labs Lab 01/30/17 2110  TROPIPOC 3.20*     BNPNo results for input(s): BNP, PROBNP in the last 168 hours.   DDimer No results for input(s): DDIMER in the last 168 hours.   Radiology    No results found.  Cardiac Studies   Left Heart Cath and Coronary Angiography 01/30/17   Conclusion     Ost LAD to Prox LAD lesion, 90 %stenosed.  Prox LAD to Mid LAD lesion, 85 %stenosed.  Prox Cx to Mid Cx lesion, 100 %stenosed.  Ost 1st Mrg to 1st Mrg lesion, 95 %stenosed.  There is mild left ventricular systolic dysfunction.  LV end diastolic pressure is normal.  The left ventricular  ejection fraction is 45-50% by visual estimate.  A STENT VISION RX [id number] bare metal stent was successfully placed.  Mid RCA lesion, 100 %stenosed.  Post intervention, there is a 0% residual stenosis.   1. Severe 3 vessel obstructive CAD    - 90% focal proximal LAD    - 85% segmental LAD following the first diagonal    - 95% ostial first OM    - 100% mid LCx. Left to left collaterals to OM2 and OM3    - 100% mid RCA- this is the culprit lesion 2. Mild LV dysfunction with inferior wall motion abnormality. 3. Normal LVEDP 4. Successful stenting of the mid RCA with DES  Plan: would continue DAPT for one month. Resume IV heparin for Afib. May resume Xarelto tomorrow pm if no complications. Given advanced 3 vessel disease I would recommend CABG once she has recovered from her infarct in 4-6 weeks. Aggressive risk factor modification.   2-D echocardiogram (02/02/17)  Study Conclusions  - Left ventricle: The cavity size was normal. Wall thickness was   increased in a pattern of mild LVH. Systolic function was mildly   reduced. The estimated ejection fraction was in the range of 45%   to 50%. There is hypokinesis of the basal-midinferior myocardium. - Aortic valve: Trileaflet; mildly thickened, mildly calcified   leaflets. - Mitral valve: There was moderate regurgitation. - Left atrium: The atrium was mildly dilated. - Right atrium: The atrium was mildly dilated. - Tricuspid valve: There was moderate regurgitation directed   eccentrically and toward the septum. - Pulmonary arteries: Systolic pressure was moderately increased.   PA peak pressure: 52 mm Hg (S).  Patient Profile     76 y.o. female history of peripheral arterial disease and coronary artery disease. Other problems include hypertension and hyperlipidemia. She does have atrial fibrillation on oral" status post cardioversion by Dr. Royann Campbell 08/10/16 successfully but ultimately have recurrent A. fib. She was admitted on  01/30/17 with an inferior STEMI and underwent PCI and bare metal stenting using a vision stent by Dr. Swaziland radially. She had high-grade LAD and a total AV  groove circumflex with preserved LV function. The plan was to treat her with dual Antiplatelety as well as Xarelto for her A. fib for one month and then stop the P2Y12 and consider bypass surgery. She currently is on low-dose aspirin,clopidogrel and  Xarelto.her nausea has resolved. She is ambulating without limitation.    Assessment & Plan    1: Inferior STEMI-postop day #2 inferior STEMI treated with PCI and bare-metal stenting by Dr. Swaziland. She does have a total AV groove circumflex and high-grade LAD disease with preserved LV function. She is on aspirin and Brilenta. Her peak troponin was 30. The plan was to give her one month of dual antibiotic therapy and consider elective bypass surgery after that. She is nauseated which may be related to Archer. Given the fact that she will also need to be on Xarelto for her A. fib I Stopped her Esperanza Heir and began her on clopidogrel.    2: Peripheral arterial disease-stable  3: Hypertension- her blood pressure is well-controlled on her current oral medications.    Christy. Giannattasio looks better today. She is ambulating without limitation. She denies chest pain or shortness of breath. She remains in A. fib which is chronic at this point. She is on low-dose aspirin, Plavix and Xarelto. She is stable for discharge today. We will have her see mid-level provider in 7 days and me back in 2-3 weeks.   Alphonsus Sias, MD  02/04/2017, 10:34 AM

## 2017-02-05 ENCOUNTER — Telehealth: Payer: Self-pay | Admitting: Cardiovascular Disease

## 2017-02-05 MED ORDER — ROPINIROLE HCL 0.25 MG PO TABS: 0.2500 mg | ORAL_TABLET | Freq: Every day | ORAL | 1 refills | Status: DC

## 2017-02-05 MED ORDER — ATORVASTATIN CALCIUM 80 MG PO TABS: 80.0000 mg | ORAL_TABLET | Freq: Every day | ORAL | 6 refills | Status: AC

## 2017-02-05 MED ORDER — NITROGLYCERIN 0.4 MG SL SUBL: 0.4000 mg | SUBLINGUAL_TABLET | SUBLINGUAL | 12 refills | Status: AC | PRN

## 2017-02-05 MED ORDER — CLOPIDOGREL BISULFATE 75 MG PO TABS: 75.0000 mg | ORAL_TABLET | Freq: Every day | ORAL | 6 refills | Status: AC

## 2017-02-05 NOTE — Telephone Encounter (Signed)
Please call husband at brother's phone [phone]. He will be there for the next 10 minutes waiting for your call. His phone is not working.

## 2017-02-05 NOTE — Telephone Encounter (Signed)
New Message  Pt husband call requesting to speak with RN about getting pt medication filled. Pt husband states pt went to the ER yesterday for a stroke. He states pt did not receive any medication upon discharge. Please call back to discuss

## 2017-02-05 NOTE — Telephone Encounter (Signed)
Returned call to patient's husband.He stated wife went to ED yesterday.Stated they did not get medication refills.Medication reviewed he voiced understanding.Refills sent to pharmacy.Advised to keep appointment with Corine Shelter PA 02/19/17 at 8:30 am.

## 2017-02-10 ENCOUNTER — Encounter (HOSPITAL_COMMUNITY): Payer: Self-pay | Admitting: Emergency Medicine

## 2017-02-10 ENCOUNTER — Inpatient Hospital Stay (HOSPITAL_COMMUNITY)
Admission: EM | Admit: 2017-02-10 | Discharge: 2017-02-18 | DRG: 871 | Disposition: A | Discharge status: Home / Self Care | Payer: Medicare HMO | Attending: Family Medicine | Admitting: Family Medicine

## 2017-02-10 ENCOUNTER — Telehealth: Payer: Self-pay | Admitting: Cardiovascular Disease

## 2017-02-10 ENCOUNTER — Emergency Department (HOSPITAL_COMMUNITY): Payer: Medicare HMO

## 2017-02-10 DIAGNOSIS — R0902 Hypoxemia: Secondary | ICD-10-CM

## 2017-02-10 DIAGNOSIS — I4891 Unspecified atrial fibrillation: Secondary | ICD-10-CM | POA: Diagnosis present

## 2017-02-10 DIAGNOSIS — N179 Acute kidney failure, unspecified: Secondary | ICD-10-CM | POA: Diagnosis present

## 2017-02-10 DIAGNOSIS — J9601 Acute respiratory failure with hypoxia: Secondary | ICD-10-CM | POA: Diagnosis present

## 2017-02-10 DIAGNOSIS — E785 Hyperlipidemia, unspecified: Secondary | ICD-10-CM | POA: Diagnosis not present

## 2017-02-10 DIAGNOSIS — I130 Hypertensive heart and chronic kidney disease with heart failure and stage 1 through stage 4 chronic kidney disease, or unspecified chronic kidney disease: Secondary | ICD-10-CM | POA: Diagnosis present

## 2017-02-10 DIAGNOSIS — D631 Anemia in chronic kidney disease: Secondary | ICD-10-CM | POA: Diagnosis present

## 2017-02-10 DIAGNOSIS — G9341 Metabolic encephalopathy: Secondary | ICD-10-CM | POA: Diagnosis not present

## 2017-02-10 DIAGNOSIS — K449 Diaphragmatic hernia without obstruction or gangrene: Secondary | ICD-10-CM | POA: Diagnosis present

## 2017-02-10 DIAGNOSIS — Z885 Allergy status to narcotic agent status: Secondary | ICD-10-CM

## 2017-02-10 DIAGNOSIS — E872 Acidosis: Secondary | ICD-10-CM | POA: Diagnosis present

## 2017-02-10 DIAGNOSIS — Z7901 Long term (current) use of anticoagulants: Secondary | ICD-10-CM

## 2017-02-10 DIAGNOSIS — E860 Dehydration: Secondary | ICD-10-CM | POA: Diagnosis present

## 2017-02-10 DIAGNOSIS — Z8711 Personal history of peptic ulcer disease: Secondary | ICD-10-CM

## 2017-02-10 DIAGNOSIS — R112 Nausea with vomiting, unspecified: Secondary | ICD-10-CM | POA: Diagnosis present

## 2017-02-10 DIAGNOSIS — T464X5A Adverse effect of angiotensin-converting-enzyme inhibitors, initial encounter: Secondary | ICD-10-CM | POA: Diagnosis present

## 2017-02-10 DIAGNOSIS — Z955 Presence of coronary angioplasty implant and graft: Secondary | ICD-10-CM

## 2017-02-10 DIAGNOSIS — Z6832 Body mass index (BMI) 32.0-32.9, adult: Secondary | ICD-10-CM

## 2017-02-10 DIAGNOSIS — J9621 Acute and chronic respiratory failure with hypoxia: Secondary | ICD-10-CM | POA: Diagnosis present

## 2017-02-10 DIAGNOSIS — N183 Chronic kidney disease, stage 3 (moderate): Secondary | ICD-10-CM | POA: Diagnosis present

## 2017-02-10 DIAGNOSIS — I2119 ST elevation (STEMI) myocardial infarction involving other coronary artery of inferior wall: Secondary | ICD-10-CM | POA: Diagnosis present

## 2017-02-10 DIAGNOSIS — J189 Pneumonia, unspecified organism: Secondary | ICD-10-CM | POA: Diagnosis not present

## 2017-02-10 DIAGNOSIS — Y95 Nosocomial condition: Secondary | ICD-10-CM | POA: Diagnosis present

## 2017-02-10 DIAGNOSIS — N182 Chronic kidney disease, stage 2 (mild): Secondary | ICD-10-CM

## 2017-02-10 DIAGNOSIS — Z7902 Long term (current) use of antithrombotics/antiplatelets: Secondary | ICD-10-CM

## 2017-02-10 DIAGNOSIS — K922 Gastrointestinal hemorrhage, unspecified: Secondary | ICD-10-CM | POA: Diagnosis present

## 2017-02-10 DIAGNOSIS — A419 Sepsis, unspecified organism: Secondary | ICD-10-CM | POA: Diagnosis not present

## 2017-02-10 DIAGNOSIS — R627 Adult failure to thrive: Secondary | ICD-10-CM | POA: Diagnosis present

## 2017-02-10 DIAGNOSIS — I5043 Acute on chronic combined systolic (congestive) and diastolic (congestive) heart failure: Secondary | ICD-10-CM | POA: Diagnosis present

## 2017-02-10 DIAGNOSIS — I2510 Atherosclerotic heart disease of native coronary artery without angina pectoris: Secondary | ICD-10-CM | POA: Diagnosis present

## 2017-02-10 DIAGNOSIS — F17210 Nicotine dependence, cigarettes, uncomplicated: Secondary | ICD-10-CM | POA: Diagnosis present

## 2017-02-10 DIAGNOSIS — I255 Ischemic cardiomyopathy: Secondary | ICD-10-CM

## 2017-02-10 DIAGNOSIS — I481 Persistent atrial fibrillation: Secondary | ICD-10-CM | POA: Diagnosis present

## 2017-02-10 DIAGNOSIS — K222 Esophageal obstruction: Secondary | ICD-10-CM | POA: Diagnosis present

## 2017-02-10 DIAGNOSIS — R7989 Other specified abnormal findings of blood chemistry: Secondary | ICD-10-CM

## 2017-02-10 DIAGNOSIS — K219 Gastro-esophageal reflux disease without esophagitis: Secondary | ICD-10-CM | POA: Diagnosis present

## 2017-02-10 DIAGNOSIS — I252 Old myocardial infarction: Secondary | ICD-10-CM

## 2017-02-10 DIAGNOSIS — R109 Unspecified abdominal pain: Secondary | ICD-10-CM

## 2017-02-10 DIAGNOSIS — R197 Diarrhea, unspecified: Secondary | ICD-10-CM | POA: Diagnosis present

## 2017-02-10 DIAGNOSIS — R778 Other specified abnormalities of plasma proteins: Secondary | ICD-10-CM | POA: Diagnosis present

## 2017-02-10 DIAGNOSIS — K5900 Constipation, unspecified: Secondary | ICD-10-CM | POA: Diagnosis present

## 2017-02-10 DIAGNOSIS — Z800 Family history of malignant neoplasm of digestive organs: Secondary | ICD-10-CM

## 2017-02-10 DIAGNOSIS — R0602 Shortness of breath: Secondary | ICD-10-CM | POA: Diagnosis not present

## 2017-02-10 DIAGNOSIS — I70209 Unspecified atherosclerosis of native arteries of extremities, unspecified extremity: Secondary | ICD-10-CM | POA: Diagnosis present

## 2017-02-10 DIAGNOSIS — F17200 Nicotine dependence, unspecified, uncomplicated: Secondary | ICD-10-CM

## 2017-02-10 DIAGNOSIS — K921 Melena: Secondary | ICD-10-CM | POA: Diagnosis present

## 2017-02-10 DIAGNOSIS — E876 Hypokalemia: Secondary | ICD-10-CM | POA: Diagnosis not present

## 2017-02-10 DIAGNOSIS — Z7982 Long term (current) use of aspirin: Secondary | ICD-10-CM

## 2017-02-10 DIAGNOSIS — Z888 Allergy status to other drugs, medicaments and biological substances status: Secondary | ICD-10-CM

## 2017-02-10 DIAGNOSIS — R748 Abnormal levels of other serum enzymes: Secondary | ICD-10-CM | POA: Diagnosis not present

## 2017-02-10 DIAGNOSIS — Z95828 Presence of other vascular implants and grafts: Secondary | ICD-10-CM

## 2017-02-10 DIAGNOSIS — I5023 Acute on chronic systolic (congestive) heart failure: Secondary | ICD-10-CM | POA: Diagnosis present

## 2017-02-10 DIAGNOSIS — Z887 Allergy status to serum and vaccine status: Secondary | ICD-10-CM

## 2017-02-10 DIAGNOSIS — E669 Obesity, unspecified: Secondary | ICD-10-CM | POA: Diagnosis present

## 2017-02-10 LAB — I-STAT VENOUS BLOOD GAS, ED
Acid-base deficit: 3 mmol/L — ABNORMAL HIGH (ref 0.0–2.0)
Bicarbonate: 19.6 mmol/L — ABNORMAL LOW (ref 20.0–28.0)
O2 SAT: 83 %
PH VEN: 7.464 — AB (ref 7.250–7.430)
TCO2: 20 mmol/L (ref 0–100)
pCO2, Ven: 27.3 mmHg — ABNORMAL LOW (ref 44.0–60.0)
pO2, Ven: 44 mmHg (ref 32.0–45.0)

## 2017-02-10 LAB — CBC
HCT: 36 % (ref 36.0–46.0)
Hemoglobin: 12.2 g/dL (ref 12.0–15.0)
MCH: 27.1 pg (ref 26.0–34.0)
MCHC: 33.9 g/dL (ref 30.0–36.0)
MCV: 80 fL (ref 78.0–100.0)
PLATELETS: 262 10*3/uL (ref 150–400)
RBC: 4.5 MIL/uL (ref 3.87–5.11)
RDW: 16.1 % — ABNORMAL HIGH (ref 11.5–15.5)
WBC: 18.9 10*3/uL — ABNORMAL HIGH (ref 4.0–10.5)

## 2017-02-10 LAB — BASIC METABOLIC PANEL
ANION GAP: 11 (ref 5–15)
BUN: 51 mg/dL — AB (ref 6–20)
CALCIUM: 8.6 mg/dL — AB (ref 8.9–10.3)
CO2: 19 mmol/L — ABNORMAL LOW (ref 22–32)
CREATININE: 1.32 mg/dL — AB (ref 0.44–1.00)
Chloride: 104 mmol/L (ref 101–111)
GFR calc Af Amer: 45 mL/min — ABNORMAL LOW (ref >60)
GFR, EST NON AFRICAN AMERICAN: 38 mL/min — AB (ref >60)
GLUCOSE: 119 mg/dL — AB (ref 65–99)
Potassium: 4.7 mmol/L (ref 3.5–5.1)
Sodium: 134 mmol/L — ABNORMAL LOW (ref 135–145)

## 2017-02-10 LAB — URINALYSIS, ROUTINE W REFLEX MICROSCOPIC
Bilirubin Urine: NEGATIVE
GLUCOSE, UA: NEGATIVE mg/dL
Ketones, ur: NEGATIVE mg/dL
Leukocytes, UA: NEGATIVE
NITRITE: NEGATIVE
Protein, ur: 100 mg/dL — AB
SPECIFIC GRAVITY, URINE: 1.024 (ref 1.005–1.030)
pH: 5 (ref 5.0–8.0)

## 2017-02-10 LAB — I-STAT TROPONIN, ED: TROPONIN I, POC: 0.09 ng/mL — AB (ref 0.00–0.08)

## 2017-02-10 LAB — D-DIMER, QUANTITATIVE (NOT AT ARMC): D DIMER QUANT: 2.28 ug{FEU}/mL — AB (ref 0.00–0.50)

## 2017-02-10 LAB — CBG MONITORING, ED: GLUCOSE-CAPILLARY: 107 mg/dL — AB (ref 65–99)

## 2017-02-10 MED ORDER — VANCOMYCIN HCL 10 G IV SOLR
1500.0000 mg | Freq: Once | INTRAVENOUS | Status: AC
  Administered 2017-02-10: 1500 mg via INTRAVENOUS
  Filled 2017-02-10: qty 1500

## 2017-02-10 MED ORDER — METOPROLOL TARTRATE 25 MG PO TABS
100.0000 mg | ORAL_TABLET | Freq: Once | ORAL | Status: AC
  Administered 2017-02-10: 100 mg via ORAL
  Filled 2017-02-10: qty 4

## 2017-02-10 MED ORDER — PIPERACILLIN-TAZOBACTAM 3.375 G IVPB
3.3750 g | Freq: Three times a day (TID) | INTRAVENOUS | Status: DC
  Filled 2017-02-10: qty 50

## 2017-02-10 MED ORDER — SODIUM CHLORIDE 0.9 % IV BOLUS (SEPSIS)
500.0000 mL | Freq: Once | INTRAVENOUS | Status: AC
  Administered 2017-02-10: 500 mL via INTRAVENOUS

## 2017-02-10 MED ORDER — VANCOMYCIN HCL 10 G IV SOLR: 1250.0000 mg | INTRAVENOUS | Status: DC

## 2017-02-10 MED ORDER — PIPERACILLIN-TAZOBACTAM 3.375 G IVPB 30 MIN
3.3750 g | Freq: Once | INTRAVENOUS | Status: AC
  Administered 2017-02-10: 3.375 g via INTRAVENOUS
  Filled 2017-02-10: qty 50

## 2017-02-10 NOTE — Progress Notes (Signed)
Pharmacy Antibiotic Note Christy Campbell is a 76 y.o. female admitted on 02/10/2017 with increase SOB. Pharmacy has been consulted for Zosyn and vancomycin dosing due to concern for PNA.  Plan: 1. Vancomycin 1250 IV every 24 hours.  Goal trough 15-20 mcg/mL. 2. Zosyn 3.375g IV q8h (4 hour infusion).    Height: 5\' 6"  (167.6 cm) Weight: 199 lb (90.3 kg) IBW/kg (Calculated) : 59.3  Temp (24hrs), Avg:98 F (36.7 C), Min:98 F (36.7 C), Max:98 F (36.7 C)   Recent Labs Lab 02/10/17 2030  WBC 18.9*  CREATININE 1.32*    Estimated Creatinine Clearance: 41.7 mL/min (A) (by C-G formula based on SCr of 1.32 mg/dL (H)).    Allergies  Allergen Reactions  . Aspirin Nausea And Vomiting and Other (See Comments)    "pain; hurting"  . Tetanus Toxoids Anaphylaxis and Swelling  . Phenergan [Promethazine Hcl] Other (See Comments)    Severe confusion  . Hydrocodone Nausea Only  . Oxycodone Nausea Only  . Pravastatin     myalgia    Antimicrobials this admission: 7/25 Zosyn >>  7/25 vancomycin  >>   Thank you for allowing pharmacy to be a part of this patient's care.  Pollyann Samples, PharmD, BCPS 02/10/2017, 10:01 PM

## 2017-02-10 NOTE — ED Provider Notes (Signed)
[facility]-EMERGENCY DEPT Provider Note   CSN: [id number] Arrival date & time: 02/10/17  2002     History   Chief Complaint Chief Complaint  Patient presents with  . Dizziness    HPI Christy Campbell is a 76 y.o. female.  HPI   76 y.o. female history of PAD and recent BMS for inferior STEMI on 7/14. Pt has diffuse disease Pt has afib, now on Xarlto. She had high-grade LAD and a total AV groove circumflex with preserved LV function (48%). Discharged with plan for dual Antiplatelet and   Xarelto for her A. fib then consider bypass surgery.   Patient here with increased shortness of breath. Patient is incredibly tachypnic on exam. They report that she had had some shortness breath after the surgery but got better and then it got worse the last 2 days.  Past Medical History:  Diagnosis Date  . Anemia    "long time ago"  . Arthritis    "back; right knee and ankle"  . Dyslipidemia   . GERD (gastroesophageal reflux disease)   . H/O cardiovascular stress test 12/06/08   normal, no ischemia  . History of stomach ulcers 03/01/12   "little bitty ones"  . Hypertension    echo 12/06/08-EF>55%, mild AS and mild pulmonary htn  . Kidney stones   . PVD (peripheral vascular disease) (HCC) 12/29/2011   h/o right common iliac artery stent, left SFA stent and left SFA directional atherectomy 02/29/12  . Tobacco abuse     Patient Active Problem List   Diagnosis Date Noted  . Epigastric pain   . Acute ST elevation myocardial infarction (STEMI) of inferior wall (HCC)   . Atrial fibrillation, persistent (HCC) 07/22/2016  . Transient hypotension and bradycardia in setting of "indigestion" 07/18/2014  . Nephrolithiasis 07/18/2014  . Smoker 03/24/2013  . Renal lesion, incidental finding on CT 12/29/11 12/30/2011  . HTN, LVH, Nl LVF May 2010 12/30/2011  . Dyslipidemia 12/30/2011  . Normal Myoview, May 2010 12/30/2011  . PVD, Rt SFA PTA 12/29/11,  Lt. PTA SFA 03/01/12  12/29/2011  . Claudication in  peripheral vascular disease, lifestyle limiting 12/29/2011    Past Surgical History:  Procedure Laterality Date  . APPENDECTOMY    . ATHERECTOMY  03/01/2012  . ATHERECTOMY N/A 12/29/2011   Procedure: ATHERECTOMY;  Surgeon: Runell Gess, MD;  Location: [facility] CATH LAB;  Service: Cardiovascular;  Laterality: N/A;  . ATHERECTOMY N/A 03/01/2012   Procedure: ATHERECTOMY;  Surgeon: Runell Gess, MD;  Location: [facility] CATH LAB;  Service: Cardiovascular;  Laterality: N/A;  . CARDIOVERSION N/A 08/10/2016   Procedure: CARDIOVERSION;  Surgeon: Thurmon Fair, MD;  Location: [facility] ENDOSCOPY;  Service: Cardiovascular;  Laterality: N/A;  . CHOLECYSTECTOMY    . COLONOSCOPY    . CORONARY/GRAFT ACUTE MI REVASCULARIZATION N/A 01/30/2017   Procedure: Coronary/Graft Acute MI Revascularization;  Surgeon: Swaziland, Peter M, MD;  Location: [facility] INVASIVE CV LAB;  Service: Cardiovascular;  Laterality: N/A;  . EYE SURGERY     Vitrectomy, and bilateral cataracts  . INGUINAL HERNIA REPAIR  1990's   right  . KNEE ARTHROSCOPY  1990's   bilaterally  . LASER PHOTO ABLATION Right 07/27/2013   Procedure: LASER PHOTO ABLATION;  Surgeon: Edmon Crape, MD;  Location: [facility] OR;  Service: Ophthalmology;  Laterality: Right;  . LEFT HEART CATH AND CORONARY ANGIOGRAPHY N/A 01/30/2017   Procedure: Left Heart Cath and Coronary Angiography;  Surgeon: Swaziland, Peter M, MD;  Location: [facility] INVASIVE CV LAB;  Service: Cardiovascular;  Laterality: N/A;  . PARS PLANA VITRECTOMY Right 07/27/2013   Procedure: PARS PLANA VITRECTOMY WITH 25 GAUGE;  Surgeon: Edmon Crape, MD;  Location: [facility] OR;  Service: Ophthalmology;  Laterality: Right;  . PERIPHERAL ARTERIAL STENT GRAFT  03/01/2012   successful directional atheretomy using turbo hawk of a focal prox left SFA lesion   . PV angiogram  12/29/11   successful directinal atherectomy using the turbo hawk to righ SFA stenosis  . PV angiogram  04/17/10   successful PTA/stenting of the left superficial femoral artery  and right iliac  . TUBAL LIGATION  1960's  . VAGINAL HYSTERECTOMY  1970's    OB History    No data available       Home Medications    Prior to Admission medications   Medication Sig Start Date End Date Taking? Authorizing Provider  acetaminophen (TYLENOL) 325 MG tablet Take 325 mg by mouth 2 (two) times daily as needed. For pain   Yes [provider]  aspirin EC 81 MG EC tablet Take 1 tablet (81 mg total) by mouth daily. 02/05/17  Yes Bhagat, Bhavinkumar, PA  atorvastatin (LIPITOR) 80 MG tablet Take 1 tablet (80 mg total) by mouth daily at 6 PM. 02/05/17  Yes Runell Gess, MD  clopidogrel (PLAVIX) 75 MG tablet Take 1 tablet (75 mg total) by mouth daily. 02/05/17  Yes Runell Gess, MD  HYDROcodone-homatropine [facility]) 5-1.5 MG/5ML syrup Take 5 mLs by mouth every 6 (six) hours. 02/04/17  Yes Bhagat, Bhavinkumar, PA  lisinopril (PRINIVIL,ZESTRIL) 20 MG tablet Take 1 tablet (20 mg total) by mouth 2 (two) times daily. Patient taking differently: Take 40 mg by mouth 2 (two) times daily.  02/04/17  Yes Bhagat, Bhavinkumar, PA  lisinopril (PRINIVIL,ZESTRIL) 40 MG tablet Take 40 mg by mouth daily. 11/06/16  Yes [provider]  metoprolol tartrate (LOPRESSOR) 100 MG tablet Take 1 tablet (100 mg total) by mouth 2 (two) times daily. 02/04/17  Yes Bhagat, Bhavinkumar, PA  Multiple Vitamin (MULITIVITAMIN WITH MINERALS) TABS Take 1 tablet by mouth daily.   Yes [provider]  nitroGLYCERIN (NITROSTAT) 0.4 MG SL tablet Place 1 tablet (0.4 mg total) under the tongue every 5 (five) minutes x 3 doses as needed for chest pain. 02/05/17  Yes Runell Gess, MD  Omega-3 Fatty Acids (FISH OIL PEARLS) 300 MG CAPS Take 300 mg by mouth daily.   Yes [provider]  promethazine (PHENERGAN) 12.5 MG tablet Take 12.5 mg by mouth every 6 (six) hours as needed for nausea or vomiting.   Yes [provider]  rivaroxaban (XARELTO) 20 MG TABS tablet Take 1 tablet (20 mg  total) by mouth daily with supper. 01/22/17  Yes Runell Gess, MD  vitamin C (ASCORBIC ACID) 500 MG tablet Take 1,000 mg by mouth daily.    Yes [provider]  rOPINIRole (REQUIP) 0.25 MG tablet Take 1 tablet (0.25 mg total) by mouth at bedtime. Patient not taking: Reported on 02/10/2017 02/05/17   Runell Gess, MD    Family History Family History  Problem Relation Age of Onset  . CVA Mother   . Cancer Mother   . Pneumonia Father     Social History Social History  Substance Use Topics  . Smoking status: Current Some Day Smoker    Packs/day: 0.50    Years: 10.00    Types: Cigarettes  . Smokeless tobacco: Never Used  . Alcohol use No     Allergies  Aspirin; Tetanus toxoids; Phenergan [promethazine hcl]; Hydrocodone; Oxycodone; and Pravastatin   Review of Systems Review of Systems  Constitutional: Negative for activity change.  Respiratory: Positive for cough and shortness of breath.   Cardiovascular: Negative for chest pain.  Gastrointestinal: Negative for abdominal pain.     Physical Exam Updated Vital Signs BP (!) 159/119   Pulse 100   Temp 98 F (36.7 C) (Oral)   Resp (!) 28   Ht 5\' 6"  (1.676 m)   Wt 90.3 kg (199 lb)   SpO2 97%   BMI 32.12 kg/m   Physical Exam  Constitutional: She is oriented to person, place, and time. She appears well-developed and well-nourished. She appears distressed.  HENT:  Head: Normocephalic and atraumatic.  Eyes: Right eye exhibits no discharge. Left eye exhibits no discharge.  Cardiovascular: Normal heart sounds.   No murmur heard. Irregularly irregular tachycardic.  Pulmonary/Chest: She has no wheezes.  Tachypnea.  Abdominal: Soft. She exhibits no distension. There is no tenderness.  Musculoskeletal: She exhibits no edema.  Neurological: She is oriented to person, place, and time.  Skin: Skin is warm and dry. She is not diaphoretic.  Psychiatric: She has a normal mood and affect.  Nursing note and vitals  reviewed.    ED Treatments / Results  Labs (all labs ordered are listed, but only abnormal results are displayed) Labs Reviewed  BASIC METABOLIC PANEL  CBC  URINALYSIS, ROUTINE W REFLEX MICROSCOPIC  CBG MONITORING, ED  I-STAT TROPONIN, ED  I-STAT VENOUS BLOOD GAS, ED    EKG  EKG Interpretation  Date/Time:  Wednesday February 10 2017 20:11:55 EDT Ventricular Rate:  107 PR Interval:    QRS Duration: 88 QT Interval:  351 QTC Calculation: 469 R Axis:   -35 Text Interpretation:  Atrial fibrillation Probable inferior infarct, age indeterminate Anterior infarct, old Atrial fibrillation Confirmed by Bary Castilla ([id number]) on 02/10/2017 8:48:03 PM       Radiology No results found.  Procedures Procedures (including critical care time)  Medications Ordered in ED Medications  metoprolol tartrate (LOPRESSOR) tablet 100 mg (not administered)     Initial Impression / Assessment and Plan / ED Course  I have reviewed the triage vital signs and the nursing notes.  Pertinent labs & imaging results that were available during my care of the patient were reviewed by me and considered in my medical decision making (see chart for details).     76 y.o. female history of PAD and recent BMS for inferior STEMI on 7/14. Pt has diffuse disease Pt has afib, now on Xarlto. She had high-grade LAD and a total AV groove circumflex with preserved LV function (48%). Discharged with plan for dual Antiplatelet and   Xarelto for her A. fib then consider bypass surgery.   Patient here with increased shortness of breath. Patient is incredibly tachypnic on exam. They report that she had had some shortness breath after the surgery but got better and then it got worse the last 2 days.  She's had a cough for the last 2 days as well. Patient is newly on lisinopril.  11:17 PM Chest x-ray shows pneumonia. This is consistent with her cough, symptoms. Discussed with cardiology who with plan to admit to medicine  for treatment of HCAP. Troponin is 0.09 which likely still downtrending from the peak troponin of 30 last week.  Final Clinical Impressions(s) / ED Diagnoses   Final diagnoses:  None    New Prescriptions New Prescriptions   No medications on file  Abelino Derrick, MD 02/10/17 [phone]

## 2017-02-10 NOTE — ED Notes (Signed)
Re-paged cards  

## 2017-02-10 NOTE — Telephone Encounter (Signed)
Spoke with patient's husband Christy Campbell. Patient was discharged from hospital after cath on 7/17. He states that patient was given Rx for cough syrup containing hydrocodone which patient is allergic to. Explained that this is not a current allergy on her list. He states she is having N/V and is spitting up phlegm. Patient went to PCP and he Rx'ed promethazine, which is on allergy list. Explained the situation to husband regarding allergies and apologized that hydrocodone was not on list but advised that he contact PCP for recommendations on a different antiemetic given allergy to phenergan (confusion) noted. He states his wife is concerned about being at home and would prefer to be in the hospital being monitored. I spoke with wife and explained situation with meds/allergies. Advised her to not taken any more prescription cough med, informed her that husband said he would contact PCP for PRN med recommendations. Patient states when she goes from sitting/laying down position to standing she feels weak and shaky. Explained this could be a side effect of the N/V and not being able to keep foods down. Educated on importance of hydration and eating, especially given the medications she is on. She voiced concern about bleeding ulcers that she has had in the past and I advised her to watch for s/sx of bleeding (on plavix + xarelto) and to contact PCP with concerns r/t this. Advised patient to contact our office for acute concerns r/t heart.

## 2017-02-10 NOTE — ED Triage Notes (Signed)
Per EMS: Pt c/o of dizziness, weakness, n/v, and intermittent SOB.  Pt was d/c from [facility] on Saturday after having a stent placed.  Vitals PTA: BP 158/90, HR 90-120, O2 sat 97% on 2L, CBG 189.

## 2017-02-10 NOTE — H&P (Addendum)
History and Physical    Christy Campbell GMW:[id number] DOB: [birth year] DOA: 02/10/2017  Referring MD/NP/PA:   PCP: Pc, [facility]   Patient coming from:  The patient is coming from home.  At baseline, pt is independent for most of ADL.   Chief Complaint: Shortness of breath and cough, nausea, vomiting, diarrhea  HPI: Christy Campbell is a 76 y.o. female with medical history significant of hypertension, hyperlipidemia, GERD, depression, tobacco abuse, PVD, PUD, atrial fibrillation on Xarelto, CAD, sCHF, recent STEMI, s/p of recent stent placement on 01/30/17, CKD-2, who presents with cough and shortness of breath.  Patient was recently hospitalized from 7/14-7/19 due to STEMI. she had stent placed on 01/30/17. Patient states that he she has been doing fine util 5 days ago when she started having shortness of breath and cough. She can speak in full sentence. She coughs up white mucus. She denies fever or chills. Patient does not have chest pain. She also has nausea, vomiting, diarrhea. She vomited 3-4 times today, and had 10 times of loose stool bowel movement. She has mild abdominal pain in the epigastric area. Denies symptoms of UTI or unilateral weakness. Of note, nurse noted that pt had dark stool and coughed up coffee ground material in ED. Pt states that she has gained 20 LBs recently.  ED Course: pt was found to have positive FOBT, BNP 2783, WBC 18.9, troponin 0.09 which was 21.79 on 02/01/18, worsening renal function, temperature normal, A. fib with RVR on EKG, chest x-ray showed left basilar infiltration. Patient is admitted to stepdown as inpatient.   Review of Systems:   General: no fevers, chills, no changes in body weight, has poor appetite, has fatigue HEENT: no blurry vision, hearing changes or sore throat Respiratory: has dyspnea, coughing, no wheezing CV: no chest pain, no palpitations GI: has nausea, vomiting, abdominal pain, diarrhea GU: no dysuria, burning  on urination, increased urinary frequency, hematuria  Ext: has leg edema Neuro: no unilateral weakness, numbness, or tingling, no vision change or hearing loss Skin: no rash, no skin tear. MSK: No muscle spasm, no deformity, no limitation of range of movement in spin Heme: No easy bruising.  Travel history: No recent long distant travel.  Allergy:  Allergies  Allergen Reactions  . Aspirin Nausea And Vomiting and Other (See Comments)    "pain; hurting"  . Tetanus Toxoids Anaphylaxis and Swelling  . Phenergan [Promethazine Hcl] Other (See Comments)    Severe confusion  . Hydrocodone Nausea Only  . Oxycodone Nausea Only  . Pravastatin     myalgia    Past Medical History:  Diagnosis Date  . Anemia    "long time ago"  . Arthritis    "back; right knee and ankle"  . Dyslipidemia   . GERD (gastroesophageal reflux disease)   . H/O cardiovascular stress test 12/06/08   normal, no ischemia  . History of stomach ulcers 03/01/12   "little bitty ones"  . Hypertension    echo 12/06/08-EF>55%, mild AS and mild pulmonary htn  . Kidney stones   . PVD (peripheral vascular disease) (HCC) 12/29/2011   h/o right common iliac artery stent, left SFA stent and left SFA directional atherectomy 02/29/12  . Tobacco abuse     Past Surgical History:  Procedure Laterality Date  . APPENDECTOMY    . ATHERECTOMY  03/01/2012  . ATHERECTOMY N/A 12/29/2011   Procedure: ATHERECTOMY;  Surgeon: Runell Gess, MD;  Location: [facility] CATH LAB;  Service: Cardiovascular;  Laterality: N/A;  . ATHERECTOMY N/A 03/01/2012   Procedure: ATHERECTOMY;  Surgeon: Runell Gess, MD;  Location: [facility] CATH LAB;  Service: Cardiovascular;  Laterality: N/A;  . CARDIOVERSION N/A 08/10/2016   Procedure: CARDIOVERSION;  Surgeon: Thurmon Fair, MD;  Location: [facility] ENDOSCOPY;  Service: Cardiovascular;  Laterality: N/A;  . CHOLECYSTECTOMY    . COLONOSCOPY    . CORONARY/GRAFT ACUTE MI REVASCULARIZATION N/A 01/30/2017   Procedure:  Coronary/Graft Acute MI Revascularization;  Surgeon: Swaziland, Peter M, MD;  Location: [facility] INVASIVE CV LAB;  Service: Cardiovascular;  Laterality: N/A;  . EYE SURGERY     Vitrectomy, and bilateral cataracts  . INGUINAL HERNIA REPAIR  1990's   right  . KNEE ARTHROSCOPY  1990's   bilaterally  . LASER PHOTO ABLATION Right 07/27/2013   Procedure: LASER PHOTO ABLATION;  Surgeon: Edmon Crape, MD;  Location: [facility] OR;  Service: Ophthalmology;  Laterality: Right;  . LEFT HEART CATH AND CORONARY ANGIOGRAPHY N/A 01/30/2017   Procedure: Left Heart Cath and Coronary Angiography;  Surgeon: Swaziland, Peter M, MD;  Location: [facility] INVASIVE CV LAB;  Service: Cardiovascular;  Laterality: N/A;  . PARS PLANA VITRECTOMY Right 07/27/2013   Procedure: PARS PLANA VITRECTOMY WITH 25 GAUGE;  Surgeon: Edmon Crape, MD;  Location: [facility] OR;  Service: Ophthalmology;  Laterality: Right;  . PERIPHERAL ARTERIAL STENT GRAFT  03/01/2012   successful directional atheretomy using turbo hawk of a focal prox left SFA lesion   . PV angiogram  12/29/11   successful directinal atherectomy using the turbo hawk to righ SFA stenosis  . PV angiogram  04/17/10   successful PTA/stenting of the left superficial femoral artery and right iliac  . TUBAL LIGATION  1960's  . VAGINAL HYSTERECTOMY  1970's    Social History:  reports that she has been smoking Cigarettes.  She has a 5.00 pack-year smoking history. She has never used smokeless tobacco. She reports that she does not drink alcohol or use drugs.  Family History:  Family History  Problem Relation Age of Onset  . CVA Mother   . Cancer Mother   . Pneumonia Father      Prior to Admission medications   Medication Sig Start Date End Date Taking? Authorizing Provider  acetaminophen (TYLENOL) 325 MG tablet Take 325 mg by mouth 2 (two) times daily as needed. For pain   Yes [provider]  aspirin EC 81 MG EC tablet Take 1 tablet (81 mg total) by mouth daily. 02/05/17  Yes Bhagat,  Bhavinkumar, PA  atorvastatin (LIPITOR) 80 MG tablet Take 1 tablet (80 mg total) by mouth daily at 6 PM. 02/05/17  Yes Runell Gess, MD  clopidogrel (PLAVIX) 75 MG tablet Take 1 tablet (75 mg total) by mouth daily. 02/05/17  Yes Runell Gess, MD  HYDROcodone-homatropine [facility]) 5-1.5 MG/5ML syrup Take 5 mLs by mouth every 6 (six) hours. 02/04/17  Yes Bhagat, Bhavinkumar, PA  lisinopril (PRINIVIL,ZESTRIL) 20 MG tablet Take 1 tablet (20 mg total) by mouth 2 (two) times daily. Patient taking differently: Take 40 mg by mouth 2 (two) times daily.  02/04/17  Yes Bhagat, Bhavinkumar, PA  lisinopril (PRINIVIL,ZESTRIL) 40 MG tablet Take 40 mg by mouth daily. 11/06/16  Yes [provider]  metoprolol tartrate (LOPRESSOR) 100 MG tablet Take 1 tablet (100 mg total) by mouth 2 (two) times daily. 02/04/17  Yes Bhagat, Bhavinkumar, PA  Multiple Vitamin (MULITIVITAMIN WITH MINERALS) TABS Take 1 tablet by mouth daily.   Yes [provider]  nitroGLYCERIN (  NITROSTAT) 0.4 MG SL tablet Place 1 tablet (0.4 mg total) under the tongue every 5 (five) minutes x 3 doses as needed for chest pain. 02/05/17  Yes Runell Gess, MD  Omega-3 Fatty Acids (FISH OIL PEARLS) 300 MG CAPS Take 300 mg by mouth daily.   Yes [provider]  promethazine (PHENERGAN) 12.5 MG tablet Take 12.5 mg by mouth every 6 (six) hours as needed for nausea or vomiting.   Yes [provider]  rivaroxaban (XARELTO) 20 MG TABS tablet Take 1 tablet (20 mg total) by mouth daily with supper. 01/22/17  Yes Runell Gess, MD  vitamin C (ASCORBIC ACID) 500 MG tablet Take 1,000 mg by mouth daily.    Yes [provider]  rOPINIRole (REQUIP) 0.25 MG tablet Take 1 tablet (0.25 mg total) by mouth at bedtime. Patient not taking: Reported on 02/10/2017 02/05/17   Runell Gess, MD    Physical Exam: Vitals:   02/11/17 0130 02/11/17 0300 02/11/17 0302 02/11/17 0315  BP: (!) 135/112 (!) 126/103  (!) 123/94    Pulse: (!) 133 85  (!) 117  Resp: (!) 24 (!) 25  (!) 31  Temp:      TempSrc:      SpO2: 99%  99% 97%  Weight:      Height:       General: Not in acute distress HEENT:       Eyes: PERRL, EOMI, no scleral icterus.       ENT: No discharge from the ears and nose, no pharynx injection, no tonsillar enlargement.        Neck: Difficult to assess JVD due to obesity. no bruit, no mass felt. Heme: No neck lymph node enlargement. Cardiac: S1/S2, RRR, No murmurs, No gallops or rubs. Respiratory: No rales, wheezing, rhonchi or rubs. GI: Soft, nondistended, has mild tenderness in epigastric area, no rebound pain, no organomegaly, BS present. GU: No hematuria Ext: 1+ pitting leg edema bilaterally. 2+DP/PT pulse bilaterally. Musculoskeletal: No joint deformities, No joint redness or warmth, no limitation of ROM in spin. Skin: No rashes.  Neuro: Alert, oriented X3, cranial nerves II-XII grossly intact, moves all extremities normally.  Psych: Patient is not psychotic, no suicidal or hemocidal ideation.  Labs on Admission: I have personally reviewed following labs and imaging studies  CBC:  Recent Labs Lab 02/10/17 2030 02/11/17 0321  WBC 18.9* 19.0*  HGB 12.2 11.7*  HCT 36.0 34.7*  MCV 80.0 80.7  PLT 262 230   Basic Metabolic Panel:  Recent Labs Lab 02/10/17 2030  NA 134*  K 4.7  CL 104  CO2 19*  GLUCOSE 119*  BUN 51*  CREATININE 1.32*  CALCIUM 8.6*   GFR: Estimated Creatinine Clearance: 41.7 mL/min (A) (by C-G formula based on SCr of 1.32 mg/dL (H)). Liver Function Tests: No results for input(s): AST, ALT, ALKPHOS, BILITOT, PROT, ALBUMIN in the last 168 hours.  Recent Labs Lab 02/11/17 0124  LIPASE 21   No results for input(s): AMMONIA in the last 168 hours. Coagulation Profile: No results for input(s): INR, PROTIME in the last 168 hours. Cardiac Enzymes:  Recent Labs Lab 02/11/17 0124  TROPONINI 0.19*   BNP (last 3 results) No results for input(s): PROBNP in  the last 8760 hours. HbA1C: No results for input(s): HGBA1C in the last 72 hours. CBG:  Recent Labs Lab 02/04/17 0747 02/04/17 1136 02/10/17 2236  GLUCAP 100* 121* 107*   Lipid Profile: No results for input(s): CHOL, HDL, LDLCALC, TRIG,  CHOLHDL, LDLDIRECT in the last 72 hours. Thyroid Function Tests: No results for input(s): TSH, T4TOTAL, FREET4, T3FREE, THYROIDAB in the last 72 hours. Anemia Panel: No results for input(s): VITAMINB12, FOLATE, FERRITIN, TIBC, IRON, RETICCTPCT in the last 72 hours. Urine analysis:    Component Value Date/Time   COLORURINE AMBER (A) 02/10/2017 2257   APPEARANCEUR HAZY (A) 02/10/2017 2257   LABSPEC 1.024 02/10/2017 2257   PHURINE 5.0 02/10/2017 2257   GLUCOSEU NEGATIVE 02/10/2017 2257   HGBUR SMALL (A) 02/10/2017 2257   BILIRUBINUR NEGATIVE 02/10/2017 2257   KETONESUR NEGATIVE 02/10/2017 2257   PROTEINUR 100 (A) 02/10/2017 2257   UROBILINOGEN 0.2 07/18/2014 0437   NITRITE NEGATIVE 02/10/2017 2257   LEUKOCYTESUR NEGATIVE 02/10/2017 2257   Sepsis Labs: @LABRCNTIP (procalcitonin:4,lacticidven:4) ) Recent Results (from the past 240 hour(s))  Culture, Urine     Status: Abnormal   Collection Time: 02/02/17  5:00 AM  Result Value Ref Range Status   Specimen Description URINE, CLEAN CATCH  Final   Special Requests NONE  Final   Culture >=100,000 COLONIES/mL ESCHERICHIA COLI (A)  Final   Report Status 02/04/2017 FINAL  Final   Organism ID, Bacteria ESCHERICHIA COLI (A)  Final      Susceptibility   Escherichia coli - MIC*    AMPICILLIN <=2 SENSITIVE Sensitive     CEFAZOLIN <=4 SENSITIVE Sensitive     CEFTRIAXONE <=1 SENSITIVE Sensitive     CIPROFLOXACIN <=0.25 SENSITIVE Sensitive     GENTAMICIN <=1 SENSITIVE Sensitive     IMIPENEM <=0.25 SENSITIVE Sensitive     NITROFURANTOIN <=16 SENSITIVE Sensitive     TRIMETH/SULFA <=20 SENSITIVE Sensitive     AMPICILLIN/SULBACTAM <=2 SENSITIVE Sensitive     PIP/TAZO <=4 SENSITIVE Sensitive     Extended  ESBL NEGATIVE Sensitive     * >=100,000 COLONIES/mL ESCHERICHIA COLI     Radiological Exams on Admission: Dg Chest 2 View  Result Date: 02/10/2017 CLINICAL DATA:  Weakness nausea vomiting and dyspnea. Recent hospital discharge after coronary stent placement. EXAM: CHEST  2 VIEW COMPARISON:  01/30/2017 FINDINGS: Left posterior base opacity and effusion, new. Right lung is clear. Pulmonary vasculature is normal. Unchanged moderate cardiomegaly. IMPRESSION: New left base effusion and adjacent airspace opacity. This could be infectious or atelectatic. Unchanged cardiomegaly. Electronically Signed   By: Ellery Plunk M.D.   On: 02/10/2017 21:29     EKG: Independently reviewed.  Atrial fibrillation with RVR, anteroseptal infarction pattern, T-wave inversion in inferior leads, QTC 469   Assessment/Plan Principal Problem:   Acute on chronic respiratory failure with hypoxia (HCC) Active Problems:   Dyslipidemia   Smoker   Atrial fibrillation with RVR (HCC)   Nausea vomiting and diarrhea   CAD (coronary artery disease)   HCAP (healthcare-associated pneumonia)   Sepsis (HCC)   Elevated troponin   Acute renal failure superimposed on stage 2 chronic kidney disease (HCC)   GIB (gastrointestinal bleeding)   Acute on chronic systolic CHF (congestive heart failure) (HCC)   Acute on chronic respiratory failure with hypoxia: Patient has shortness of breath. Differential diagnoses include acute on chronic systolic CHF given weight gain, leg edema and elevated BNP 2783, and possible HCAP given leukocytosis, elevated lactic acid and left basilar infiltration on chest x-ray. Patient does not have fever which is not consistent with pneumonia, but cannot completely rule out pneumonia at this moment.   -will admit to [facility] as inpt -will treat CHF exacerbation and possible HCAP as below  Possible loabar HCAP and sepsis: Patient meets  criteria for sepsis with leukocytosis and tachycardia. Lactic acid  elevated at 2.2. Currently hemodynamically stable. - IV Vancomycin, Zosyn, azithromycin-->will have low threshold to d/c abx. - Mucinex for cough  - Atrovent nebs and Xopenex Neb prn for SOB - Urine legionella and S. pneumococcal antigen - Follow up blood culture x2, sputum culture and respiratory virus panel. - will get Procalcitonin and trend lactic acid level per sepsis protocol - IVF: pt received 500 cc of NS and continued 75 cc/h of NS-->will d/c IVF now given CHF exacerbation.  Acute on chronic systolic congestive heart failure: 2-D echo on 02/02/17 showed EF of 45-50 percent. Pt had weight gain, leg edema and elevated BNP 2783, consistent with CHF exacerbation. -will start IV lasix 40 mg bid -continue metoprolol and aspirin  GIB (gastrointestinal bleeding): Hgb 12.2.  - NPO  - Start IV pantoprazole gtt - Zofran IV for nausea - hold Xarelto and plavix - Avoid NSAIDs and SQ heparin - Maintain IV access (2 large bore IVs if possible). - Monitor closely and follow q6h cbc, transfuse as necessary. - LaB: INR, PTT and type screen  HLD: -Lipitor  Tobacco abuse: -Did counseling about importance of quitting smoking -Nicotine patch  Atrial fibrillation with RVR (HCC): HR is upto 146. CHA2DS2-VASc Score is 5, needs oral anticoagulation. Patient was on Xarelto at home.  -hold Xarelto due to GIB -Continue metoprolol  Nausea vomiting and diarrhea and mild AP:  -will check lipase -will check C diff pcr -When necessary Zofran for nausea -get CT-abdomen/pelvis without contrast  CAD and Elevated troponin: recent STEMI, s/p of stent on 01/30/17. No CP. Trop is 0.09 on admission. No CP. EDP discussed with cardiology, dr. Donnie Aho, who thought this is likely due to downtrending from the peak troponin of 30 last week. -hold Plavix -continue ASA -Continue Lipitor, metoprolol -When necessary nitroglycerin.  HTN: -continue metoprolol -IVF hydralazine when necessary -Hold lisinopril due to  worsening renal function.  AoCKD-II: Baseline Cre is 0.9, pt's Cre is 1.32 on admission. Likely due to prerenal secondary to dehydration and continuation of ACEI - IVF as above - Check FeNa - Follow up renal function by BMP - Hold lisinopril   DVT ppx: SCD Code Status: Full code Family Communication: Yes, patient's husband and daughter at bed side Disposition Plan:  Anticipate discharge back to previous home environment Consults called:  none Admission status: Obs / tele  Inpatient/tele   medical floor/obs     SDU/inpation       Date of Service 02/11/2017    Lorretta Harp [facility] Pager [phone]  If 7PM-7AM, please contact night-coverage [email or web address] Password [email or web address] 02/11/2017, 4:04 AM

## 2017-02-10 NOTE — Telephone Encounter (Signed)
New Message  Pt husband called requesting to speak with RN. He states after pts procedure she is throwing up phlegm husband states she was prescribed hydrocodone which he states she is allergic. pcp states she needed to go to the hospital. Husband would like to speak with RN.

## 2017-02-11 ENCOUNTER — Inpatient Hospital Stay (HOSPITAL_COMMUNITY): Payer: Medicare HMO

## 2017-02-11 ENCOUNTER — Telehealth: Payer: Self-pay | Admitting: Cardiovascular Disease

## 2017-02-11 ENCOUNTER — Encounter (HOSPITAL_COMMUNITY): Payer: Self-pay | Admitting: Physician Assistant

## 2017-02-11 DIAGNOSIS — R197 Diarrhea, unspecified: Secondary | ICD-10-CM | POA: Diagnosis present

## 2017-02-11 DIAGNOSIS — K921 Melena: Secondary | ICD-10-CM | POA: Diagnosis present

## 2017-02-11 DIAGNOSIS — I4891 Unspecified atrial fibrillation: Secondary | ICD-10-CM

## 2017-02-11 DIAGNOSIS — I5043 Acute on chronic combined systolic (congestive) and diastolic (congestive) heart failure: Secondary | ICD-10-CM | POA: Diagnosis present

## 2017-02-11 DIAGNOSIS — R112 Nausea with vomiting, unspecified: Secondary | ICD-10-CM | POA: Diagnosis not present

## 2017-02-11 DIAGNOSIS — K222 Esophageal obstruction: Secondary | ICD-10-CM | POA: Diagnosis present

## 2017-02-11 DIAGNOSIS — K219 Gastro-esophageal reflux disease without esophagitis: Secondary | ICD-10-CM | POA: Diagnosis present

## 2017-02-11 DIAGNOSIS — N170 Acute kidney failure with tubular necrosis: Secondary | ICD-10-CM | POA: Diagnosis not present

## 2017-02-11 DIAGNOSIS — Y95 Nosocomial condition: Secondary | ICD-10-CM | POA: Diagnosis present

## 2017-02-11 DIAGNOSIS — N182 Chronic kidney disease, stage 2 (mild): Secondary | ICD-10-CM | POA: Diagnosis not present

## 2017-02-11 DIAGNOSIS — I340 Nonrheumatic mitral (valve) insufficiency: Secondary | ICD-10-CM | POA: Diagnosis not present

## 2017-02-11 DIAGNOSIS — D631 Anemia in chronic kidney disease: Secondary | ICD-10-CM | POA: Diagnosis present

## 2017-02-11 DIAGNOSIS — I252 Old myocardial infarction: Secondary | ICD-10-CM | POA: Diagnosis not present

## 2017-02-11 DIAGNOSIS — I255 Ischemic cardiomyopathy: Secondary | ICD-10-CM | POA: Diagnosis present

## 2017-02-11 DIAGNOSIS — R748 Abnormal levels of other serum enzymes: Secondary | ICD-10-CM | POA: Diagnosis not present

## 2017-02-11 DIAGNOSIS — I10 Essential (primary) hypertension: Secondary | ICD-10-CM | POA: Diagnosis not present

## 2017-02-11 DIAGNOSIS — J189 Pneumonia, unspecified organism: Secondary | ICD-10-CM | POA: Diagnosis present

## 2017-02-11 DIAGNOSIS — I2510 Atherosclerotic heart disease of native coronary artery without angina pectoris: Secondary | ICD-10-CM | POA: Diagnosis present

## 2017-02-11 DIAGNOSIS — E876 Hypokalemia: Secondary | ICD-10-CM | POA: Diagnosis not present

## 2017-02-11 DIAGNOSIS — A419 Sepsis, unspecified organism: Principal | ICD-10-CM

## 2017-02-11 DIAGNOSIS — K922 Gastrointestinal hemorrhage, unspecified: Secondary | ICD-10-CM | POA: Diagnosis present

## 2017-02-11 DIAGNOSIS — I2119 ST elevation (STEMI) myocardial infarction involving other coronary artery of inferior wall: Secondary | ICD-10-CM | POA: Diagnosis present

## 2017-02-11 DIAGNOSIS — I5023 Acute on chronic systolic (congestive) heart failure: Secondary | ICD-10-CM

## 2017-02-11 DIAGNOSIS — I130 Hypertensive heart and chronic kidney disease with heart failure and stage 1 through stage 4 chronic kidney disease, or unspecified chronic kidney disease: Secondary | ICD-10-CM | POA: Diagnosis present

## 2017-02-11 DIAGNOSIS — N183 Chronic kidney disease, stage 3 (moderate): Secondary | ICD-10-CM | POA: Diagnosis present

## 2017-02-11 DIAGNOSIS — E785 Hyperlipidemia, unspecified: Secondary | ICD-10-CM | POA: Diagnosis present

## 2017-02-11 DIAGNOSIS — N179 Acute kidney failure, unspecified: Secondary | ICD-10-CM | POA: Diagnosis present

## 2017-02-11 DIAGNOSIS — R778 Other specified abnormalities of plasma proteins: Secondary | ICD-10-CM | POA: Diagnosis present

## 2017-02-11 DIAGNOSIS — J9601 Acute respiratory failure with hypoxia: Secondary | ICD-10-CM | POA: Diagnosis not present

## 2017-02-11 DIAGNOSIS — E860 Dehydration: Secondary | ICD-10-CM | POA: Diagnosis present

## 2017-02-11 DIAGNOSIS — G9341 Metabolic encephalopathy: Secondary | ICD-10-CM | POA: Diagnosis not present

## 2017-02-11 DIAGNOSIS — R0602 Shortness of breath: Secondary | ICD-10-CM | POA: Diagnosis present

## 2017-02-11 DIAGNOSIS — J9621 Acute and chronic respiratory failure with hypoxia: Secondary | ICD-10-CM | POA: Diagnosis present

## 2017-02-11 DIAGNOSIS — E872 Acidosis: Secondary | ICD-10-CM | POA: Diagnosis present

## 2017-02-11 DIAGNOSIS — K2971 Gastritis, unspecified, with bleeding: Secondary | ICD-10-CM | POA: Diagnosis not present

## 2017-02-11 DIAGNOSIS — I70209 Unspecified atherosclerosis of native arteries of extremities, unspecified extremity: Secondary | ICD-10-CM | POA: Diagnosis present

## 2017-02-11 DIAGNOSIS — R7989 Other specified abnormal findings of blood chemistry: Secondary | ICD-10-CM

## 2017-02-11 DIAGNOSIS — I481 Persistent atrial fibrillation: Secondary | ICD-10-CM | POA: Diagnosis present

## 2017-02-11 LAB — CBC
HCT: 34.7 % — ABNORMAL LOW (ref 36.0–46.0)
HCT: 37.5 % (ref 36.0–46.0)
HCT: 36.2 % (ref 36.0–46.0)
HCT: 37.4 % (ref 36.0–46.0)
Hemoglobin: 11.7 g/dL — ABNORMAL LOW (ref 12.0–15.0)
Hemoglobin: 12.1 g/dL (ref 12.0–15.0)
Hemoglobin: 12 g/dL (ref 12.0–15.0)
Hemoglobin: 12.1 g/dL (ref 12.0–15.0)
MCH: 27.2 pg (ref 26.0–34.0)
MCH: 26.8 pg (ref 26.0–34.0)
MCH: 27.4 pg (ref 26.0–34.0)
MCH: 26.3 pg (ref 26.0–34.0)
MCHC: 33.7 g/dL (ref 30.0–36.0)
MCHC: 32.3 g/dL (ref 30.0–36.0)
MCHC: 33.1 g/dL (ref 30.0–36.0)
MCHC: 32.4 g/dL (ref 30.0–36.0)
MCV: 80.7 fL (ref 78.0–100.0)
MCV: 83.1 fL (ref 78.0–100.0)
MCV: 82.6 fL (ref 78.0–100.0)
MCV: 81.3 fL (ref 78.0–100.0)
PLATELETS: 200 10*3/uL (ref 150–400)
Platelets: 230 10*3/uL (ref 150–400)
Platelets: 218 10*3/uL (ref 150–400)
Platelets: 214 10*3/uL (ref 150–400)
RBC: 4.3 MIL/uL (ref 3.87–5.11)
RBC: 4.51 MIL/uL (ref 3.87–5.11)
RBC: 4.38 MIL/uL (ref 3.87–5.11)
RBC: 4.6 MIL/uL (ref 3.87–5.11)
RDW: 16.3 % — ABNORMAL HIGH (ref 11.5–15.5)
RDW: 17 % — ABNORMAL HIGH (ref 11.5–15.5)
RDW: 16.8 % — AB (ref 11.5–15.5)
RDW: 16.5 % — ABNORMAL HIGH (ref 11.5–15.5)
WBC: 19 10*3/uL — ABNORMAL HIGH (ref 4.0–10.5)
WBC: 20.9 10*3/uL — ABNORMAL HIGH (ref 4.0–10.5)
WBC: 25.8 10*3/uL — AB (ref 4.0–10.5)
WBC: 28.5 10*3/uL — ABNORMAL HIGH (ref 4.0–10.5)

## 2017-02-11 LAB — PROTIME-INR
INR: 3.57
Prothrombin Time: 36.5 seconds — ABNORMAL HIGH (ref 11.4–15.2)

## 2017-02-11 LAB — RESPIRATORY PANEL BY PCR
ADENOVIRUS-RVPPCR: NOT DETECTED
Bordetella pertussis: NOT DETECTED
CHLAMYDOPHILA PNEUMONIAE-RVPPCR: NOT DETECTED
CORONAVIRUS NL63-RVPPCR: NOT DETECTED
Coronavirus 229E: NOT DETECTED
Coronavirus HKU1: NOT DETECTED
Coronavirus OC43: NOT DETECTED
INFLUENZA A-RVPPCR: NOT DETECTED
INFLUENZA B-RVPPCR: NOT DETECTED
MYCOPLASMA PNEUMONIAE-RVPPCR: NOT DETECTED
Metapneumovirus: NOT DETECTED
PARAINFLUENZA VIRUS 4-RVPPCR: NOT DETECTED
Parainfluenza Virus 1: NOT DETECTED
Parainfluenza Virus 2: NOT DETECTED
Parainfluenza Virus 3: NOT DETECTED
RESPIRATORY SYNCYTIAL VIRUS-RVPPCR: NOT DETECTED
Rhinovirus / Enterovirus: NOT DETECTED

## 2017-02-11 LAB — I-STAT VENOUS BLOOD GAS, ED
ACID-BASE DEFICIT: 15 mmol/L — AB (ref 0.0–2.0)
BICARBONATE: 12.3 mmol/L — AB (ref 20.0–28.0)
O2 Saturation: 58 %
TCO2: 13 mmol/L (ref 0–100)
pCO2, Ven: 33.9 mmHg — ABNORMAL LOW (ref 44.0–60.0)
pH, Ven: 7.168 — CL (ref 7.250–7.430)
pO2, Ven: 38 mmHg (ref 32.0–45.0)

## 2017-02-11 LAB — TROPONIN I
TROPONIN I: 0.19 ng/mL — AB (ref <0.03)
Troponin I: 0.2 ng/mL (ref <0.03)
Troponin I: 0.21 ng/mL (ref <0.03)
Troponin I: 0.26 ng/mL (ref <0.03)
Troponin I: 0.28 ng/mL (ref <0.03)

## 2017-02-11 LAB — ECHOCARDIOGRAM LIMITED
HEIGHTINCHES: 66 in
WEIGHTICAEL: 3184 [oz_av]

## 2017-02-11 LAB — SODIUM, URINE, RANDOM: Sodium, Ur: 14 mmol/L

## 2017-02-11 LAB — LIPASE, BLOOD: Lipase: 21 U/L (ref 11–51)

## 2017-02-11 LAB — C DIFFICILE QUICK SCREEN W PCR REFLEX
C Diff antigen: NEGATIVE
C Diff interpretation: NOT DETECTED
C Diff toxin: NEGATIVE

## 2017-02-11 LAB — CREATININE, URINE, RANDOM: Creatinine, Urine: 143.79 mg/dL

## 2017-02-11 LAB — HIV ANTIBODY (ROUTINE TESTING W REFLEX): HIV Screen 4th Generation wRfx: NONREACTIVE

## 2017-02-11 LAB — LACTIC ACID, PLASMA
LACTIC ACID, VENOUS: 5.2 mmol/L — AB (ref 0.5–1.9)
Lactic Acid, Venous: 2.2 mmol/L (ref 0.5–1.9)
Lactic Acid, Venous: 2.7 mmol/L (ref 0.5–1.9)
Lactic Acid, Venous: 2.9 mmol/L (ref 0.5–1.9)
Lactic Acid, Venous: 2.4 mmol/L (ref 0.5–1.9)

## 2017-02-11 LAB — BRAIN NATRIURETIC PEPTIDE: B Natriuretic Peptide: 2783.2 pg/mL — ABNORMAL HIGH (ref 0.0–100.0)

## 2017-02-11 LAB — STREP PNEUMONIAE URINARY ANTIGEN: Strep Pneumo Urinary Antigen: NEGATIVE

## 2017-02-11 LAB — PROCALCITONIN: Procalcitonin: 0.29 ng/mL

## 2017-02-11 LAB — MRSA PCR SCREENING: MRSA by PCR: NEGATIVE

## 2017-02-11 LAB — ABO/RH: ABO/RH(D): O POS

## 2017-02-11 LAB — TYPE AND SCREEN
ABO/RH(D): O POS
Antibody Screen: NEGATIVE

## 2017-02-11 LAB — APTT: aPTT: 30 seconds (ref 24–36)

## 2017-02-11 LAB — POC OCCULT BLOOD, ED: Fecal Occult Bld: POSITIVE — AB

## 2017-02-11 MED ORDER — NITROGLYCERIN 0.4 MG SL SUBL: 0.4000 mg | SUBLINGUAL_TABLET | SUBLINGUAL | Status: DC | PRN

## 2017-02-11 MED ORDER — SODIUM CHLORIDE 0.9 % IV SOLN
INTRAVENOUS | Status: DC
  Administered 2017-02-11: 01:00:00 via INTRAVENOUS

## 2017-02-11 MED ORDER — FUROSEMIDE 10 MG/ML IJ SOLN: 40.0000 mg | Freq: Once | INTRAMUSCULAR | Status: DC

## 2017-02-11 MED ORDER — LEVALBUTEROL HCL 1.25 MG/0.5ML IN NEBU
1.2500 mg | INHALATION_SOLUTION | Freq: Four times a day (QID) | RESPIRATORY_TRACT | Status: DC
  Administered 2017-02-11: 1.25 mg via RESPIRATORY_TRACT
  Filled 2017-02-11 (×4): qty 0.5

## 2017-02-11 MED ORDER — SODIUM CHLORIDE 0.9 % IV SOLN
0.0000 ug/min | INTRAVENOUS | Status: DC
  Filled 2017-02-11: qty 1

## 2017-02-11 MED ORDER — CLOPIDOGREL BISULFATE 75 MG PO TABS: 75.0000 mg | ORAL_TABLET | Freq: Every day | ORAL | Status: DC

## 2017-02-11 MED ORDER — ADULT MULTIVITAMIN W/MINERALS CH
1.0000 | ORAL_TABLET | Freq: Every day | ORAL | Status: DC
Start: 2017-02-11 — End: 2017-02-11

## 2017-02-11 MED ORDER — ONDANSETRON HCL 4 MG/2ML IJ SOLN
4.0000 mg | Freq: Three times a day (TID) | INTRAMUSCULAR | Status: DC | PRN
  Administered 2017-02-11 – 2017-02-16 (×2): 4 mg via INTRAVENOUS
  Filled 2017-02-11 (×2): qty 2

## 2017-02-11 MED ORDER — ACETAMINOPHEN 325 MG PO TABS: 325.0000 mg | ORAL_TABLET | Freq: Two times a day (BID) | ORAL | Status: DC | PRN

## 2017-02-11 MED ORDER — IPRATROPIUM BROMIDE 0.02 % IN SOLN
0.5000 mg | Freq: Four times a day (QID) | RESPIRATORY_TRACT | Status: DC
  Administered 2017-02-11 (×2): 0.5 mg via RESPIRATORY_TRACT
  Filled 2017-02-11 (×3): qty 2.5

## 2017-02-11 MED ORDER — SODIUM CHLORIDE 0.9 % IV SOLN
250.0000 mL | INTRAVENOUS | Status: DC | PRN
  Administered 2017-02-12: 10 mL via INTRAVENOUS

## 2017-02-11 MED ORDER — IPRATROPIUM BROMIDE 0.02 % IN SOLN: 0.5000 mg | RESPIRATORY_TRACT | Status: DC | PRN

## 2017-02-11 MED ORDER — METOPROLOL TARTRATE 25 MG PO TABS
100.0000 mg | ORAL_TABLET | Freq: Two times a day (BID) | ORAL | Status: DC
Start: 2017-02-11 — End: 2017-02-11
  Administered 2017-02-11: 100 mg via ORAL
  Filled 2017-02-11: qty 4

## 2017-02-11 MED ORDER — NICOTINE 21 MG/24HR TD PT24
21.0000 mg | MEDICATED_PATCH | Freq: Every day | TRANSDERMAL | Status: DC
  Administered 2017-02-11: 21 mg via TRANSDERMAL
  Filled 2017-02-11 (×3): qty 1

## 2017-02-11 MED ORDER — FISH OIL PEARLS 300 MG PO CAPS: 300.0000 mg | ORAL_CAPSULE | Freq: Every day | ORAL | Status: DC

## 2017-02-11 MED ORDER — ORAL CARE MOUTH RINSE
15.0000 mL | Freq: Two times a day (BID) | OROMUCOSAL | Status: DC
  Administered 2017-02-11 – 2017-02-18 (×10): 15 mL via OROMUCOSAL

## 2017-02-11 MED ORDER — SODIUM CHLORIDE 0.9 % IV BOLUS (SEPSIS): 500.0000 mL | Freq: Once | INTRAVENOUS | Status: DC

## 2017-02-11 MED ORDER — LEVALBUTEROL HCL 1.25 MG/0.5ML IN NEBU
1.2500 mg | INHALATION_SOLUTION | RESPIRATORY_TRACT | Status: DC | PRN
Start: 2017-02-11 — End: 2017-02-18
  Filled 2017-02-11: qty 0.5

## 2017-02-11 MED ORDER — SODIUM CHLORIDE 0.9 % IV BOLUS (SEPSIS): 1000.0000 mL | Freq: Once | INTRAVENOUS | Status: DC

## 2017-02-11 MED ORDER — PROMETHAZINE HCL 25 MG PO TABS
12.5000 mg | ORAL_TABLET | Freq: Four times a day (QID) | ORAL | Status: DC | PRN
  Administered 2017-02-11: 12.5 mg via ORAL
  Filled 2017-02-11: qty 1

## 2017-02-11 MED ORDER — HYDROCODONE-HOMATROPINE 5-1.5 MG/5ML PO SYRP: 5.0000 mL | ORAL_SOLUTION | Freq: Four times a day (QID) | ORAL | Status: DC | PRN

## 2017-02-11 MED ORDER — METOCLOPRAMIDE HCL 5 MG/ML IJ SOLN
10.0000 mg | Freq: Once | INTRAMUSCULAR | Status: AC
  Administered 2017-02-11: 10 mg via INTRAVENOUS
  Filled 2017-02-11: qty 2

## 2017-02-11 MED ORDER — METOPROLOL TARTRATE 5 MG/5ML IV SOLN: 2.5000 mg | INTRAVENOUS | Status: DC | PRN

## 2017-02-11 MED ORDER — METOPROLOL TARTRATE 5 MG/5ML IV SOLN
2.5000 mg | Freq: Four times a day (QID) | INTRAVENOUS | Status: DC
  Administered 2017-02-11 – 2017-02-12 (×4): 2.5 mg via INTRAVENOUS
  Filled 2017-02-11 (×5): qty 5

## 2017-02-11 MED ORDER — RIVAROXABAN 20 MG PO TABS: 20.0000 mg | ORAL_TABLET | Freq: Every day | ORAL | Status: DC

## 2017-02-11 MED ORDER — ASPIRIN EC 81 MG PO TBEC
81.0000 mg | DELAYED_RELEASE_TABLET | Freq: Every day | ORAL | Status: DC
Start: 2017-02-11 — End: 2017-02-11

## 2017-02-11 MED ORDER — VITAMIN C 500 MG PO TABS: 1000.0000 mg | ORAL_TABLET | Freq: Every day | ORAL | Status: DC

## 2017-02-11 MED ORDER — FUROSEMIDE 10 MG/ML IJ SOLN
40.0000 mg | Freq: Two times a day (BID) | INTRAMUSCULAR | Status: DC
  Administered 2017-02-11: 40 mg via INTRAVENOUS
  Filled 2017-02-11: qty 4

## 2017-02-11 MED ORDER — SODIUM CHLORIDE 0.9 % IV SOLN
80.0000 mg | Freq: Once | INTRAVENOUS | Status: AC
  Administered 2017-02-11: 80 mg via INTRAVENOUS
  Filled 2017-02-11: qty 80

## 2017-02-11 MED ORDER — HYDRALAZINE HCL 20 MG/ML IJ SOLN: 5.0000 mg | INTRAMUSCULAR | Status: DC | PRN

## 2017-02-11 MED ORDER — ATORVASTATIN CALCIUM 80 MG PO TABS: 80.0000 mg | ORAL_TABLET | Freq: Every day | ORAL | Status: DC

## 2017-02-11 MED ORDER — AZITHROMYCIN 500 MG IV SOLR
500.0000 mg | INTRAVENOUS | Status: DC
  Administered 2017-02-11: 500 mg via INTRAVENOUS
  Filled 2017-02-11: qty 500

## 2017-02-11 MED ORDER — DM-GUAIFENESIN ER 30-600 MG PO TB12
1.0000 | ORAL_TABLET | Freq: Two times a day (BID) | ORAL | Status: DC
  Administered 2017-02-11: 1 via ORAL
  Filled 2017-02-11: qty 1

## 2017-02-11 MED ORDER — SODIUM CHLORIDE 0.9 % IV SOLN
8.0000 mg/h | INTRAVENOUS | Status: DC
  Administered 2017-02-11 – 2017-02-12 (×5): 8 mg/h via INTRAVENOUS
  Filled 2017-02-11 (×15): qty 80

## 2017-02-11 MED ORDER — ZOLPIDEM TARTRATE 5 MG PO TABS: 5.0000 mg | ORAL_TABLET | Freq: Every evening | ORAL | Status: DC | PRN

## 2017-02-11 NOTE — ED Notes (Signed)
Dr Niu at bedside 

## 2017-02-11 NOTE — Telephone Encounter (Signed)
New message    Pt daughter is calling asking for a call back. Pt is in the hospital.

## 2017-02-11 NOTE — Consult Note (Signed)
ROS    Cardiology Consultation:   Patient ID: Christy Campbell; [id number]; 16-Jun-1941   Admit date: 02/10/2017 Date of Consult: 02/11/2017  Primary Care Provider: Pc, [facility] Primary Cardiologist: Dr Allyson Sabal Primary Electrophysiologist:  n/a   Patient Profile:   Christy Campbell is a 76 y.o. female with a hx of chronic tobacco abuse, HTN, dyslipidemia and PVD with s/p multiple PTA procedures including right common iliac artery stent, left SFA stent, left SFA directional atherectomy 2013, persistent atrial fibrillation s/p DCCV 07/2016 on Xarelto and recent STEMI who is being seen today for the evaluation of atrial fib at the request of IM/CCM.  Pt is on BiPAP and not able to communicate well. Husband is at bedside and gives most of the history.   History of Present Illness:   Christy Campbell was admitted 07/14-07/19 for inferior STEMI, s/p BMS mRCA. Pt also has 90% LAD and 100% CFX, consider CABG once she recovers from her MI. EF 45-50% at cath and by echo. Plan was for DAPT x 1 month and then CABG.   At d/c from the hospital, pt was coughing, occasionally productive of whitish sputum. She continued to cough but had no fevers.   She has a hx ulcers and was having epigastric pain at the time of her MI. After d/c, the epigastric pain gradually worsened. Pt was also constipated. She did not see dark or tarry stools, but was concerned about the possibility of bleeding.   She has been compliant with rx, was d/c'd on ASA, Plavix and Xarelto. She was given cough syrup with hydrocodone>>N&V>>Phenergan given to her by PCP>> she is allergic to it with MS changes. Pt also complaining of orthostatic sx, weakness and feeling "shaky".   Finally, last pm, SOB worsened and pt came to the ER. Initial ECG was atrial fib, RVR w/ HR 107. HR recorded up to 120. Pt rec'd 100 mg metoprolol at 21:49 on 07/25 and at 01:12 on 07/26.   Pt HR is currently controlled but SBP has been dropping, now  in the 70s. CCM is aware and pt is receiving IVF per the sepsis protocol, but at a reduced level to avoid CHF.   Pt has not had chest pain, just upper epigastric pain. SOB is improved on the BiPAP. No palpitations, not really aware of the atrial fib.    Past Medical History:  Diagnosis Date  . Anemia    "long time ago"  . Arthritis    "back; right knee and ankle"  . Dyslipidemia   . GERD (gastroesophageal reflux disease)   . H/O cardiovascular stress test 12/06/08   normal, no ischemia  . History of stomach ulcers 03/01/12   "little bitty ones"  . Hypertension    echo 12/06/08-EF>55%, mild AS and mild pulmonary htn  . Kidney stones   . PVD (peripheral vascular disease) (HCC) 12/29/2011   h/o right common iliac artery stent, left SFA stent and left SFA directional atherectomy 02/29/12  . Tobacco abuse    Quit 01/30/2017    Past Surgical History:  Procedure Laterality Date  . APPENDECTOMY    . ATHERECTOMY  03/01/2012  . ATHERECTOMY N/A 12/29/2011   Procedure: ATHERECTOMY;  Surgeon: Runell Gess, MD;  Location: [facility] CATH LAB;  Service: Cardiovascular;  Laterality: N/A;  . ATHERECTOMY N/A 03/01/2012   Procedure: ATHERECTOMY;  Surgeon: Runell Gess, MD;  Location: [facility] CATH LAB;  Service: Cardiovascular;  Laterality: N/A;  . CARDIOVERSION N/A 08/10/2016   Procedure:  CARDIOVERSION;  Surgeon: Thurmon Fair, MD;  Location: [facility] ENDOSCOPY;  Service: Cardiovascular;  Laterality: N/A;  . CHOLECYSTECTOMY    . COLONOSCOPY    . CORONARY/GRAFT ACUTE MI REVASCULARIZATION N/A 01/30/2017   Procedure: Coronary/Graft Acute MI Revascularization;  Surgeon: Swaziland, Peter M, MD;  Location: [facility] INVASIVE CV LAB;  Service: Cardiovascular;  Laterality: N/A;  . EYE SURGERY     Vitrectomy, and bilateral cataracts  . INGUINAL HERNIA REPAIR  1990's   right  . KNEE ARTHROSCOPY  1990's   bilaterally  . LASER PHOTO ABLATION Right 07/27/2013   Procedure: LASER PHOTO ABLATION;  Surgeon: Edmon Crape, MD;  Location:  [facility] OR;  Service: Ophthalmology;  Laterality: Right;  . LEFT HEART CATH AND CORONARY ANGIOGRAPHY N/A 01/30/2017   Procedure: Left Heart Cath and Coronary Angiography;  Surgeon: Swaziland, Peter M, MD;  Location: [facility] INVASIVE CV LAB;  Service: Cardiovascular;  Laterality: N/A;  . PARS PLANA VITRECTOMY Right 07/27/2013   Procedure: PARS PLANA VITRECTOMY WITH 25 GAUGE;  Surgeon: Edmon Crape, MD;  Location: [facility] OR;  Service: Ophthalmology;  Laterality: Right;  . PERIPHERAL ARTERIAL STENT GRAFT  03/01/2012   successful directional atheretomy using turbo hawk of a focal prox left SFA lesion   . PV angiogram  12/29/11   successful directinal atherectomy using the turbo hawk to righ SFA stenosis  . PV angiogram  04/17/10   successful PTA/stenting of the left superficial femoral artery and right iliac  . TUBAL LIGATION  1960's  . VAGINAL HYSTERECTOMY  1970's     Inpatient Medications: Scheduled Meds: . ipratropium  0.5 mg Nebulization Q6H  . levalbuterol  1.25 mg Nebulization Q6H  . nicotine  21 mg Transdermal Daily   Continuous Infusions: . sodium chloride    . azithromycin Stopped (02/11/17 0659)  . pantoprozole (PROTONIX) infusion Stopped (02/11/17 0742)  . piperacillin-tazobactam (ZOSYN)  IV    . sodium chloride    . vancomycin     PRN Meds: sodium chloride, ondansetron Prior to Admission medications   Medication Sig Start Date End Date Taking? Authorizing Provider  acetaminophen (TYLENOL) 325 MG tablet Take 325 mg by mouth 2 (two) times daily as needed. For pain   Yes [provider]  aspirin EC 81 MG EC tablet Take 1 tablet (81 mg total) by mouth daily. 02/05/17  Yes Bhagat, Bhavinkumar, PA  atorvastatin (LIPITOR) 80 MG tablet Take 1 tablet (80 mg total) by mouth daily at 6 PM. 02/05/17  Yes Runell Gess, MD  clopidogrel (PLAVIX) 75 MG tablet Take 1 tablet (75 mg total) by mouth daily. 02/05/17  Yes Runell Gess, MD  HYDROcodone-homatropine [facility]) 5-1.5 MG/5ML syrup Take 5  mLs by mouth every 6 (six) hours. 02/04/17  Yes Bhagat, Bhavinkumar, PA  lisinopril (PRINIVIL,ZESTRIL) 20 MG tablet Take 1 tablet (20 mg total) by mouth 2 (two) times daily. Patient taking differently: Take 40 mg by mouth 2 (two) times daily.  02/04/17  Yes Bhagat, Bhavinkumar, PA  lisinopril (PRINIVIL,ZESTRIL) 40 MG tablet Take 40 mg by mouth daily. 11/06/16  Yes [provider]  metoprolol tartrate (LOPRESSOR) 100 MG tablet Take 1 tablet (100 mg total) by mouth 2 (two) times daily. 02/04/17  Yes Bhagat, Bhavinkumar, PA  Multiple Vitamin (MULITIVITAMIN WITH MINERALS) TABS Take 1 tablet by mouth daily.   Yes [provider]  nitroGLYCERIN (NITROSTAT) 0.4 MG SL tablet Place 1 tablet (0.4 mg total) under the tongue every 5 (five) minutes x 3 doses as needed for chest  pain. 02/05/17  Yes Runell Gess, MD  Omega-3 Fatty Acids (FISH OIL PEARLS) 300 MG CAPS Take 300 mg by mouth daily.   Yes [provider]  promethazine (PHENERGAN) 12.5 MG tablet Take 12.5 mg by mouth every 6 (six) hours as needed for nausea or vomiting.   Yes [provider]  rivaroxaban (XARELTO) 20 MG TABS tablet Take 1 tablet (20 mg total) by mouth daily with supper. 01/22/17  Yes Runell Gess, MD  vitamin C (ASCORBIC ACID) 500 MG tablet Take 1,000 mg by mouth daily.    Yes [provider]  rOPINIRole (REQUIP) 0.25 MG tablet Take 1 tablet (0.25 mg total) by mouth at bedtime. Patient not taking: Reported on 02/10/2017 02/05/17   Runell Gess, MD    Allergies:    Allergies  Allergen Reactions  . Aspirin Nausea And Vomiting and Other (See Comments)    "pain; hurting"  . Tetanus Toxoids Anaphylaxis and Swelling  . Phenergan [Promethazine Hcl] Other (See Comments)    Severe confusion  . Phenergan [Promethazine] Other (See Comments)    Severe confusion  . Hydrocodone Nausea Only  . Oxycodone Nausea Only  . Pravastatin     myalgia    Social History:   Social History   Social  History  . Marital status: Married    Spouse name: N/A  . Number of children: N/A  . Years of education: N/A   Occupational History  . Retired    Social History Main Topics  . Smoking status: Former Smoker    Packs/day: 0.50    Years: 10.00    Types: Cigarettes    Quit date: 01/30/2017  . Smokeless tobacco: Never Used  . Alcohol use No  . Drug use: No  . Sexual activity: Yes   Other Topics Concern  . Not on file   Social History Narrative   Pt lives with husband in [place], [place]    Family History:   Family History  Problem Relation Age of Onset  . CVA Mother   . Cancer Mother   . Pneumonia Father    Family Status  Relation Status  . Mother Deceased  . Father Deceased    ROS:  Please see the history of present illness. ROS limited by pt condition.   Physical Exam/Data:   Vitals:   02/11/17 0815 02/11/17 0830 02/11/17 0850 02/11/17 0856  BP:  (!) 81/50 (!) 89/52 (!) 89/52  Pulse:   74 93  Resp:  (!) 26 18 19   Temp:      TempSrc:      SpO2: 94%  98% 98%  Weight:      Height:        Intake/Output Summary (Last 24 hours) at 02/11/17 0916 Last data filed at 02/11/17 0742  Gross per 24 hour  Intake           643.33 ml  Output              100 ml  Net           543.33 ml   Filed Weights   02/10/17 2008  Weight: 199 lb (90.3 kg)   Body mass index is 32.12 kg/m.  General:  Well nourished, well developed, in mild respiratory distress on BiPAP HEENT: normal for age Lymph: no adenopathy Neck: no JVD seen, difficult to assess 2nd equipment Endocrine:  No thryomegaly Vascular: No carotid bruits; FA pulses 1-2+ bilaterally without bruits  Cardiac:  normal S1, S2;  Irreg R&R; no murmur  Lungs:  Bibasilar rales, no wheezing, + rhonchi, + rales  Abd: soft, diffusely tender, no hepatomegaly  Ext: no edema Musculoskeletal:  No deformities, no joint effusions Skin: warm and dry  Neuro:  CNs 2-12 intact, no focal abnormalities noted  EKG:  The EKG was  personally reviewed and demonstrates:  Atrial fib, RVR, inferior T wave inversions seen at d/c ECG 07/19 Telemetry:  Telemetry was personally reviewed and demonstrates:  Atrial fib, controlled VR  Relevant CV Studies: LHC: 01/30/17 Conclusion    Ost LAD to Prox LAD lesion, 90 %stenosed.  Prox LAD to Mid LAD lesion, 85 %stenosed.  Prox Cx to Mid Cx lesion, 100 %stenosed.  Ost 1st Mrg to 1st Mrg lesion, 95 %stenosed.  There is mild left ventricular systolic dysfunction.  LV end diastolic pressure is normal.  The left ventricular ejection fraction is 45-50% by visual estimate.  A STENT VISION RX [id number] bare metal stent was successfully placed.  Mid RCA lesion, 100 %stenosed.  Post intervention, there is a 0% residual stenosis.  1. Severe 3 vessel obstructive CAD - 90% focal proximal LAD - 85% segmental LAD following the first diagonal - 95% ostial first OM - 100% mid LCx. Left to left collaterals to OM2 and OM3 - 100% mid RCA- this is the culprit lesion 2. Mild LV dysfunction with inferior wall motion abnormality. 3. Normal LVEDP 4. Successful stenting of the mid RCA with DES  Plan: would continue DAPT for one month. Resume IV heparin for Afib. May resume Xarelto tomorrow pm if no complications. Given advanced 3 vessel disease I would recommend CABG once she has recovered from her infarct in 4-6 weeks. Aggressive risk factor modification.   ECHO: 02/02/17 Study Conclusions - Left ventricle: The cavity size was normal. Wall thickness was increased in a pattern of mild LVH. Systolic function was mildly reduced. The estimated ejection fraction was in the range of 45% to 50%. There is hypokinesis of the basal-midinferior myocardium. - Aortic valve: Trileaflet; mildly thickened, mildly calcified leaflets. - Mitral valve: There was moderate regurgitation. - Left atrium: The atrium was mildly dilated. - Right atrium: The atrium was mildly  dilated. - Tricuspid valve: There was moderate regurgitation directed eccentrically and toward the septum. - Pulmonary arteries: Systolic pressure was moderately increased. PA peak pressure: 52 mm Hg (S)   Laboratory Data:  Chemistry  Recent Labs Lab 02/10/17 2030  NA 134*  K 4.7  CL 104  CO2 19*  GLUCOSE 119*  BUN 51*  CREATININE 1.32*  CALCIUM 8.6*  GFRNONAA 38*  GFRAA 45*  ANIONGAP 11    Hematology  Recent Labs Lab 02/10/17 2030 02/11/17 0321  WBC 18.9* 19.0*  RBC 4.50 4.30  HGB 12.2 11.7*  HCT 36.0 34.7*  MCV 80.0 80.7  MCH 27.1 27.2  MCHC 33.9 33.7  RDW 16.1* 16.3*  PLT 262 230   Cardiac Enzymes  Recent Labs Lab 02/11/17 0124  TROPONINI 0.19*    Troponin I  Date Value Ref Range Status  02/11/2017 0.19 (HH) <0.03 ng/mL Final  02/01/2017 21.79 (HH) <0.03 ng/mL Final  01/31/2017 30.16 (HH) <0.03 ng/mL Final  01/31/2017 30.19 (HH) <0.03 ng/mL Final  01/31/2017 24.87 (HH) <0.03 ng/mL Final    Recent Labs Lab 02/10/17 2121  TROPIPOC 0.09*    BNP  Recent Labs Lab 02/10/17 2030  BNP 2,783.2*    DDimer   Recent Labs Lab 02/10/17 2030  DDIMER 2.28*   Lipid Panel  Component Value Date/Time   CHOL 142 01/30/2017 2104   TRIG 80 01/30/2017 2104   HDL 41 01/30/2017 2104   CHOLHDL 3.5 01/30/2017 2104   VLDL 16 01/30/2017 2104   LDLCALC 85 01/30/2017 2104   Radiology/Studies:  Dg Chest 2 View Result Date: 02/10/2017 CLINICAL DATA:  Weakness nausea vomiting and dyspnea. Recent hospital discharge after coronary stent placement. EXAM: CHEST  2 VIEW COMPARISON:  01/30/2017 FINDINGS: Left posterior base opacity and effusion, new. Right lung is clear. Pulmonary vasculature is normal. Unchanged moderate cardiomegaly. IMPRESSION: New left base effusion and adjacent airspace opacity. This could be infectious or atelectatic. Unchanged cardiomegaly. Electronically Signed   By: Ellery Plunk M.D.   On: 02/10/2017 21:29   Dg Chest Portable  1 View Result Date: 02/11/2017 CLINICAL DATA:  Shortness of breath. EXAM: PORTABLE CHEST 1 VIEW COMPARISON:  02/10/2017 FINDINGS: The patient is rotated to the left. The cardiomediastinal silhouette is unchanged. The lungs are mildly hypoinflated. A small left pleural effusion and patchy left basilar airspace opacity are unchanged. There is unchanged minimal right basilar atelectasis. No pleural effusion is seen. Thoracic spondylosis is noted. IMPRESSION: Unchanged small left pleural effusion and left basilar pneumonia or atelectasis. Electronically Signed   By: Sebastian Ache M.D.   On: 02/11/2017 08:25    Assessment and Plan:   1.  Atrial fibrillation with RVR (HCC) - HR is consistent w/ acute illness. - agree with BB for rate control as BP will allow - would use IV metoprolol with parameters - believe current hypotension at least partly caused by metoprolol po. - would tolerate HR up to 110 with acute illness.   2.  Elevated troponin - trending down from STEMI, think levels are consistent w/ recent MI - could also get demand ischemia from 90% LAD and 100% CFX - continue to follow but stent is open  3. CAD (coronary artery disease) with recent STEMI.  - spoke w/ Dr Tresa Endo - ok to hold Plavix, ASA for now - within 48 hr, need to start IIB/IIIA such as Aggrastat w/ recent stent - continue BB, statin as BP and po status allow. - once OK to restart anti-platelet therapy, would not restart ASA  4. Chronic anticoagulation - agree w/ holding Xarelto for now - once source of bleeding known, decide if risks of anticoagulation outweigh benefits - cannot cardiovert if cannot anticoagulate  5.  Acute on chronic systolic CHF (congestive heart failure) (HCC) - BNP significantly elevated - Pt rec'd Lasix 40 mg IV x 1 - monitor volume status carefully, will have to be careful about IVF  Otherwise, per IM/CCM Principal Problem:    Acute on chronic respiratory failure with hypoxia (HCC) -   currently on BiPAP Active Problems:   Dyslipidemia   Smoker   Nausea vomiting and diarrhea   HCAP (healthcare-associated pneumonia)   Sepsis (HCC)   Acute renal failure superimposed on stage 2 chronic kidney disease (HCC)   GIB (gastrointestinal bleeding)   Acute respiratory failure with hypoxia (HCC)    Signed, Theodore Demark, PA-C  02/11/2017 9:16 AM    Patient seen and examined. Agree with assessment and plan.  Idler is a 76 year old female who has a history of significant CAD and PVD, hypertension, hyperlipidemia, atrial fibrillation, and chronic tobacco abuse.  She is status post right common iliac artery stent, left SFA stent, and left SFA atherectomy.  She has persistent atrial fibrillation and has been on Xarelto.  She was admitted on July 14 with an  inferior ST segment elevation MI and underwent bare-metal stenting to her mid RCA.  She was found to have concomitant CAD with 90% LAD stenosis with total occlusion of the circumflex, and ultimate CABG revascularization surgery was considered following recovery of her myocardial infarction.  At that time, ejection fraction was in the 45-50% range.  She has a history of ulcer disease and recently has developed epigastric discomfort, nausea and vomiting.  She develop increasing shortness of breath, left evening.  Upon presentation to the emergency room.  She was in atrial fibrillation with ventricular rate in the low 100s up to 120 bpm.  She received her home dose metoprolol at 2149 last evening and then again at 1:12 AM.  This resulted in hypotension with blood pressure dropping into the 70s.  She has received intravenous fluids.  She has been transported to the medical intensive care unit where following intravenous fluids, her blood pressure is now 110 systolic.  Her atrial fibrillation rate is now 106 bpm. .  She is very sedate and I was unable to obtain any history from the patient.  She did not have any complaints earlier of chest  discomfort.  A CT of her abdomen, pelvis and chest were performed.  There was third spacing of fluid small bilateral pleural effusions and a small amount of ascites and flank edema, as well as retroperitoneal edema.  There was moderate car to my leg CAD and aortic atherosclerosis.  The colon was nondistended, although there is an air-fluid level in the rectum suggestive of a diarrheal process.  She had lower lumbar spondylosis and degenerative disc disease with potential L4-5 impingement.  Agree with the physical exam findings as noted above.  Her ECG today, independently reviewed by me shows atrial fibrillation 10 2 bpm with left anterior hemiblock.  She has inferior T-wave inversion consistent with recent inferior MI.  She also has QS complex anteroseptal he consistent with possible old anteroseptal MI.  QTc interval is prolonged.  Laboratories notable for potassium 4.7.  BUN 51, creatinine 1.32 with stage III kidney disease.  BNP is significantly elevated at 2783 consistent with CHF, probably combined acute on chronic.  She is not significantly anemic with hemoglobin 12.1, hematocrit 37.5.  D-dimer was increased to 2.2.  Weight.  She has been on anticoagulation.  At present, will hold Plavix but this should be reinstituted if hemoglobin is stable and no signs of active GI bleeding.  Her stent is a bare metal stent which was placed on January 30, 2017 and Plavix may ultimately be discontinued after one month, particularly of the plan is for CABG surgery.  However, she is certainly within the window of subacute thrombosis.  If Plavix is need to be held for longer duration during this admission would institute Aggrastat for glycoprotein 2 B3 inhibition in 2 days to reduce risk of subacute thrombosis.  As her blood pressure stabilizes, plan IV diuresis with significant BNP elevation.  Consider repeat echo Doppler study.  Will follow patient with you.   Lennette Bihari, MD, [facility] 02/11/2017 11:24 AM

## 2017-02-11 NOTE — Progress Notes (Signed)
  Echocardiogram 2D Echocardiogram has been performed.  Arvil Chaco 02/11/2017, 2:36 PM

## 2017-02-11 NOTE — Progress Notes (Signed)
RT called by RN for pt declining respiratory status. Pt appears in severe distress, air hungry, inc RR. Pt placed on Bipap at this time. MD at bedside, XRAY at bedside.

## 2017-02-11 NOTE — ED Provider Notes (Signed)
Pt still in the ED awaiting for a bed when the nurse called me to pt's bedside.  The pt has developed more significant SOB.  Her bp is down in the 80s.  The pt is clammy and her hands are very cold.  The pt looks to be in distress.  Pt's admitting doctor called.  Resp called for bipap.  Repeat EKG shows no significant changes.  The pt has now been put on bipap.  Pt given 1L NS.  Hospitalists called critical care.  They are here and I will leave pt in their capable hands.   Jacalyn Lefevre, MD 02/11/17 [phone]

## 2017-02-11 NOTE — ED Notes (Signed)
Pt reports her nausea has subsided since administration of ordered zofran. Dr. Clyde Lundborg paged regarding pt condition and critical lab values.

## 2017-02-11 NOTE — ED Notes (Addendum)
Dr. Clyde Lundborg aware of pt BNP, trop, lactic, and occult result. Dr. Clyde Lundborg aware of pt BP. Per Dr. Clyde Lundborg will d/c pt ordered plavix and wait for protonix order.

## 2017-02-11 NOTE — Care Management Note (Signed)
Case Management Note  Patient Details  Name: Christy Campbell MRN: [id number] Date of Birth: [birth year]  Subjective/Objective:    Presents with NSTEMI, PTA independent from home.  Pt is now s/p placement of a bare metal stent - pt already on Xarelto at home  , was on bipap now on [place], waiting for her to wake up more.              Action/Plan: NCM will follow for dc needs.   Expected Discharge Date:                  Expected Discharge Plan:  Home/Self Care  In-House Referral:     Discharge planning Services  CM Consult  Post Acute Care Choice:    Choice offered to:     DME Arranged:    DME Agency:     HH Arranged:    HH Agency:     Status of Service:  In process, will continue to follow  If discussed at Long Length of Stay Meetings, dates discussed:    Additional Comments:  Leone Haven, RN 02/11/2017, 5:18 PM

## 2017-02-11 NOTE — ED Notes (Signed)
Pt repositioned and linens changed. Pt still c/o of feeling hot and is dry heaving. Reglan given, cooling measures attempted - ice packs, wash rags, etc. Family at bedside.

## 2017-02-11 NOTE — ED Notes (Signed)
Pt's family requested for pt not to receive any type of phenergan. Pt's family reports that she became severely confused from IV phenergan in the past. Family reports pt is getting agitated from PO phenergan as well. MD Niu aware.

## 2017-02-11 NOTE — Telephone Encounter (Signed)
Returned call to daughter who reports patient is in the ED and is very sick.  Reports they are very concerned and request that "you get a doctor down here to see her".   Advised I would speak to cardmaster as well as send message to Dr. Allyson Sabal to make him aware.       Spoke to [place], [place] on the way to see patient at this time.     Daughter made aware.

## 2017-02-11 NOTE — Progress Notes (Signed)
PROGRESS NOTE  Yaretzi Ernandez Hachey ZOX:[id number] DOB: [birth year] DOA: 02/10/2017 PCP: Pc, [facility]  HPI/Recap of past 24 hours:  Patient is diaphoretic, tachypneic RR in the 40's,  she is hypotensive, spb in the 70-80's, she has cold extremities and she is very lethargic, She is put on bipap,   Assessment/Plan: Principal Problem:   Acute on chronic respiratory failure with hypoxia (HCC) Active Problems:   Dyslipidemia   Smoker   Atrial fibrillation with RVR (HCC)   Nausea vomiting and diarrhea   CAD (coronary artery disease)   HCAP (healthcare-associated pneumonia)   Sepsis (HCC)   Elevated troponin   Acute renal failure superimposed on stage 2 chronic kidney disease (HCC)   GIB (gastrointestinal bleeding)   Acute on chronic systolic CHF (congestive heart failure) (HCC)   Acute respiratory failure with hypoxia (HCC)   Hypotension, respiratory distress, lethargy,  Likely multifactorial, differential including sepsis from pneumonia, (with leukocytosis, lactic acidosis, though no fever),  cardiac, gi bleed,  Will get stat cxr/ekg/abg Avoid excessive ivf Due to h/o chf,  She is not stable to admit to stepdown, she may need pressors , critical care consulted, she is to be admitted to icu  GI bleed, with epigastric pain, FOBT +, hgb 12 Asa/plavix/xarelto held, she is on ppi drip, will need gi consult once hemodynamically improved.  CAD with Recent h/o STEMI with three vessel disease, s/p stent on 01/30/2017, plan to have CABG after a few weeks DAPT,, patient denies chest pain, does report epigastric pain, DAPT therapy held due to concerns of GI bleed, STAT cardiology consulted.  AFib/RVR: course complicated with hypotension, gi bleed Anticoagulation held, cardiology consulted.  AKI on CKD III Bun /cr 51/1.32 in the setting of gi bleed and possible sepsis,  ua with many bacteremia, wbc 0-5, rbc 0-5, negative nitrite, negative leukocyte esterase She is already  on abx, renal dosing meds, repeat bmp in am. Hold lisinopril  Code Status: full  Family Communication: patient , daughter at bedside  Disposition Plan: transfer to ICU   Consultants:  Critical care  cardiology  Procedures:  bipap  Antibiotics:  Vanc/zosyn/   Objective: BP (!) 89/52   Pulse 74   Temp 98.9 F (37.2 C) (Rectal)   Resp 18   Ht 5\' 6"  (1.676 m)   Wt 90.3 kg (199 lb)   SpO2 98%   BMI 32.12 kg/m   Intake/Output Summary (Last 24 hours) at 02/11/17 0855 Last data filed at 02/11/17 0742  Gross per 24 hour  Intake           643.33 ml  Output              100 ml  Net           543.33 ml   Filed Weights   02/10/17 2008  Weight: 90.3 kg (199 lb)    Exam:   General:  Lethargic, tachypneic, diaphoresis   Cardiovascular: IRRR  Respiratory: diminished at basis, no wheezing, no rhonchi  Abdomen: epigastric tenderness, no guarding, no rebound, positive BS  Musculoskeletal: mild bilateral lower extremity pitting Edema, extremity cold to touch  Neuro: lethargic  Data Reviewed: Basic Metabolic Panel:  Recent Labs Lab 02/10/17 2030  NA 134*  K 4.7  CL 104  CO2 19*  GLUCOSE 119*  BUN 51*  CREATININE 1.32*  CALCIUM 8.6*   Liver Function Tests: No results for input(s): AST, ALT, ALKPHOS, BILITOT, PROT, ALBUMIN in the last 168 hours.  Recent Labs  Lab 02/11/17 0124  LIPASE 21   No results for input(s): AMMONIA in the last 168 hours. CBC:  Recent Labs Lab 02/10/17 2030 02/11/17 0321  WBC 18.9* 19.0*  HGB 12.2 11.7*  HCT 36.0 34.7*  MCV 80.0 80.7  PLT 262 230   Cardiac Enzymes:    Recent Labs Lab 02/11/17 0124  TROPONINI 0.19*   BNP (last 3 results)  Recent Labs  02/10/17 2030  BNP 2,783.2*    ProBNP (last 3 results) No results for input(s): PROBNP in the last 8760 hours.  CBG:  Recent Labs Lab 02/04/17 1136 02/10/17 2236  GLUCAP 121* 107*    Recent Results (from the past 240 hour(s))  Culture, Urine      Status: Abnormal   Collection Time: 02/02/17  5:00 AM  Result Value Ref Range Status   Specimen Description URINE, CLEAN CATCH  Final   Special Requests NONE  Final   Culture >=100,000 COLONIES/mL ESCHERICHIA COLI (A)  Final   Report Status 02/04/2017 FINAL  Final   Organism ID, Bacteria ESCHERICHIA COLI (A)  Final      Susceptibility   Escherichia coli - MIC*    AMPICILLIN <=2 SENSITIVE Sensitive     CEFAZOLIN <=4 SENSITIVE Sensitive     CEFTRIAXONE <=1 SENSITIVE Sensitive     CIPROFLOXACIN <=0.25 SENSITIVE Sensitive     GENTAMICIN <=1 SENSITIVE Sensitive     IMIPENEM <=0.25 SENSITIVE Sensitive     NITROFURANTOIN <=16 SENSITIVE Sensitive     TRIMETH/SULFA <=20 SENSITIVE Sensitive     AMPICILLIN/SULBACTAM <=2 SENSITIVE Sensitive     PIP/TAZO <=4 SENSITIVE Sensitive     Extended ESBL NEGATIVE Sensitive     * >=100,000 COLONIES/mL ESCHERICHIA COLI  C difficile quick screen w PCR reflex     Status: None   Collection Time: 02/11/17  1:55 AM  Result Value Ref Range Status   C Diff antigen NEGATIVE NEGATIVE Final   C Diff toxin NEGATIVE NEGATIVE Final   C Diff interpretation No C. difficile detected.  Final     Studies: Dg Chest 2 View  Result Date: 02/10/2017 CLINICAL DATA:  Weakness nausea vomiting and dyspnea. Recent hospital discharge after coronary stent placement. EXAM: CHEST  2 VIEW COMPARISON:  01/30/2017 FINDINGS: Left posterior base opacity and effusion, new. Right lung is clear. Pulmonary vasculature is normal. Unchanged moderate cardiomegaly. IMPRESSION: New left base effusion and adjacent airspace opacity. This could be infectious or atelectatic. Unchanged cardiomegaly. Electronically Signed   By: Ellery Plunk M.D.   On: 02/10/2017 21:29   Dg Chest Portable 1 View  Result Date: 02/11/2017 CLINICAL DATA:  Shortness of breath. EXAM: PORTABLE CHEST 1 VIEW COMPARISON:  02/10/2017 FINDINGS: The patient is rotated to the left. The cardiomediastinal silhouette is unchanged.  The lungs are mildly hypoinflated. A small left pleural effusion and patchy left basilar airspace opacity are unchanged. There is unchanged minimal right basilar atelectasis. No pleural effusion is seen. Thoracic spondylosis is noted. IMPRESSION: Unchanged small left pleural effusion and left basilar pneumonia or atelectasis. Electronically Signed   By: Sebastian Ache M.D.   On: 02/11/2017 08:25    Scheduled Meds: . ipratropium  0.5 mg Nebulization Q6H  . levalbuterol  1.25 mg Nebulization Q6H  . nicotine  21 mg Transdermal Daily    Continuous Infusions: . sodium chloride    . azithromycin Stopped (02/11/17 0659)  . pantoprozole (PROTONIX) infusion Stopped (02/11/17 0742)  . piperacillin-tazobactam (ZOSYN)  IV    . sodium chloride    .  vancomycin      Total Critical Care time in examining the patient bedside, evaluating Lab work and other data, over half of the total time was spent in coordinating patient care on the floor or bedisde in talking to patient/family members, communicating with nursing Staff in the ED and sub specialists critical care and cardiology to coordinate patients medical care and needs is Minutes.  The condition which has caused critical injury/acute impairment of  vital organ system with a high probability of sudden clinically significant deterioration and can cause Potential Life threatening injury to this patient addressed today is respiratory distress, hypotension, lethargy, possible sepsis vs cardiac , GI bleed,  concerning for further deterioration     Nyjai Graff MD, PhD  [facility] Pager [phone]. If 7PM-7AM, please contact night-coverage at [email or web address], password [facility] 02/11/2017, 8:55 AM  LOS: 0 days

## 2017-02-11 NOTE — H&P (Signed)
PULMONARY / CRITICAL CARE MEDICINE   Name: Christy Campbell MRN: [id number] DOB: Aug 11, 1940    ADMISSION DATE:  02/10/2017 CONSULTATION DATE:  02/11/2017  REFERRING MD:  Dr. Clyde Lundborg   CHIEF COMPLAINT:  Dyspnea   HISTORY OF PRESENT ILLNESS:   76 year old female with PMH of Anemia, Dyslipidemia, GERD, Gastric Ulcers, HTN, PVD, A.Fib on Xarelto, Systolic HF, CKD stage 2, Recent Admission 7/14-7/19 with STEMI with stenting of mid RCA with DES  Presented 7/25 with complaints of cough and dyspnea that have been reportedly worsened over the last five days however have been present since discharge on 7/19. Reports productive cough with white sputum production. Husband reports that patient has also had nausea, vomiting, and diarrhea with mid epigastric pain. Upon arrival to ED nurse noted that patient had dark stool and coffee ground emesis. Hemoccult positive. [facility] admitted patient to step-down.   Throughout night patient was given 40 meq Lasix with little output, HR 120-130 given 100 mg metoprolol x 2, PCCM was consulted as patient went into respiratory distress requiring BiPAP and hypotensive with systolic 70-80s.   PAST MEDICAL HISTORY :  She  has a past medical history of Anemia; Arthritis; Dyslipidemia; GERD (gastroesophageal reflux disease); H/O cardiovascular stress test (12/06/08); History of stomach ulcers (03/01/12); Hypertension; Kidney stones; PVD (peripheral vascular disease) (HCC) (12/29/2011); and Tobacco abuse.  PAST SURGICAL HISTORY: She  has a past surgical history that includes Peripheral arterial stent graft (03/01/2012); Atherectomy (03/01/2012); Inguinal hernia repair (1990's); Knee arthroscopy (6578'I); Vaginal hysterectomy (1970's); Tubal ligation (1960's); PV angiogram (12/29/11); PV angiogram (04/17/10); Colonoscopy; Eye surgery; Appendectomy; Cholecystectomy; Pars plana vitrectomy (Right, 07/27/2013); Laser photo ablation (Right, 07/27/2013); atherectomy (N/A, 12/29/2011); atherectomy  (N/A, 03/01/2012); Cardioversion (N/A, 08/10/2016); Left Heart Cath and Coronary Angiography (N/A, 01/30/2017); and Coronary/Graft Acute MI Revascularization (N/A, 01/30/2017).  Allergies  Allergen Reactions  . Aspirin Nausea And Vomiting and Other (See Comments)    "pain; hurting"  . Tetanus Toxoids Anaphylaxis and Swelling  . Phenergan [Promethazine Hcl] Other (See Comments)    Severe confusion  . Phenergan [Promethazine] Other (See Comments)    Severe confusion  . Hydrocodone Nausea Only  . Oxycodone Nausea Only  . Pravastatin     myalgia    No current facility-administered medications on file prior to encounter.    Current Outpatient Prescriptions on File Prior to Encounter  Medication Sig  . acetaminophen (TYLENOL) 325 MG tablet Take 325 mg by mouth 2 (two) times daily as needed. For pain  . aspirin EC 81 MG EC tablet Take 1 tablet (81 mg total) by mouth daily.  Marland Kitchen atorvastatin (LIPITOR) 80 MG tablet Take 1 tablet (80 mg total) by mouth daily at 6 PM.  . clopidogrel (PLAVIX) 75 MG tablet Take 1 tablet (75 mg total) by mouth daily.  Marland Kitchen HYDROcodone-homatropine (HYCODAN) 5-1.5 MG/5ML syrup Take 5 mLs by mouth every 6 (six) hours.  Marland Kitchen lisinopril (PRINIVIL,ZESTRIL) 20 MG tablet Take 1 tablet (20 mg total) by mouth 2 (two) times daily. (Patient taking differently: Take 40 mg by mouth 2 (two) times daily. )  . metoprolol tartrate (LOPRESSOR) 100 MG tablet Take 1 tablet (100 mg total) by mouth 2 (two) times daily.  . Multiple Vitamin (MULITIVITAMIN WITH MINERALS) TABS Take 1 tablet by mouth daily.  . nitroGLYCERIN (NITROSTAT) 0.4 MG SL tablet Place 1 tablet (0.4 mg total) under the tongue every 5 (five) minutes x 3 doses as needed for chest pain.  . rivaroxaban (XARELTO) 20 MG TABS tablet Take 1  tablet (20 mg total) by mouth daily with supper.  . vitamin C (ASCORBIC ACID) 500 MG tablet Take 1,000 mg by mouth daily.   Marland Kitchen rOPINIRole (REQUIP) 0.25 MG tablet Take 1 tablet (0.25 mg total) by mouth at  bedtime. (Patient not taking: Reported on 02/10/2017)    FAMILY HISTORY:  Her indicated that her mother is deceased. She indicated that her father is deceased.    SOCIAL HISTORY: She  reports that she quit smoking 12 days ago. Her smoking use included Cigarettes. She has a 5.00 pack-year smoking history. She has never used smokeless tobacco. She reports that she does not drink alcohol or use drugs.  REVIEW OF SYSTEMS:   All negative; except for those that are bolded, which indicate positives.  Constitutional: weight loss, weight gain, night sweats, fevers, chills, fatigue, weakness.  HEENT: headaches, sore throat, sneezing, nasal congestion, post nasal drip, difficulty swallowing, tooth/dental problems, visual complaints, visual changes, ear aches. Neuro: difficulty with speech, weakness, numbness, ataxia. CV:  chest pain, orthopnea, PND, swelling in lower extremities, dizziness, palpitations, syncope.  Resp: cough, hemoptysis, dyspnea, wheezing. GI: heartburn, indigestion, abdominal pain, nausea, vomiting, diarrhea, constipation, change in bowel habits, loss of appetite, hematemesis, melena, hematochezia.  GU: dysuria, change in color of urine, urgency or frequency, flank pain, hematuria. MSK: joint pain or swelling, decreased range of motion. Psych: change in mood or affect, depression, anxiety, suicidal ideations, homicidal ideations. Skin: rash, itching, bruising.   SUBJECTIVE:  No distress, complains of gastric pain   VITAL SIGNS: BP (!) 89/52   Pulse 93   Temp 98.9 F (37.2 C) (Rectal)   Resp 19   Ht 5\' 6"  (1.676 m)   Wt 90.3 kg (199 lb)   SpO2 98%   BMI 32.12 kg/m   HEMODYNAMICS:    VENTILATOR SETTINGS: Vent Mode: BIPAP FiO2 (%):  [50 %] 50 %  INTAKE / OUTPUT: I/O last 3 completed shifts: In: 593.3 [I.V.:193.3; IV Piggyback:400] Out: 100 [Urine:100]  PHYSICAL EXAMINATION: General:  Adult female, no distress  Neuro:  Lethargic, follows commands HEENT:   Normocephalic  Cardiovascular:  A.fib, no MRG Lungs:  Diminished to bases, non-labored, on BiPAP Abdomen:  Obese, tender, active bowels sounds  Musculoskeletal:  +2 edema to BLE Skin:  Warm, dry, intact   LABS:  BMET  Recent Labs Lab 02/10/17 2030  NA 134*  K 4.7  CL 104  CO2 19*  BUN 51*  CREATININE 1.32*  GLUCOSE 119*    Electrolytes  Recent Labs Lab 02/10/17 2030  CALCIUM 8.6*    CBC  Recent Labs Lab 02/10/17 2030 02/11/17 0321  WBC 18.9* 19.0*  HGB 12.2 11.7*  HCT 36.0 34.7*  PLT 262 230    Coag's  Recent Labs Lab 02/11/17 0305  APTT 30  INR 3.57    Sepsis Markers  Recent Labs Lab 02/11/17 0124 02/11/17 0444  LATICACIDVEN 2.2* 2.7*  PROCALCITON 0.29  --     ABG No results for input(s): PHART, PCO2ART, PO2ART in the last 168 hours.  Liver Enzymes No results for input(s): AST, ALT, ALKPHOS, BILITOT, ALBUMIN in the last 168 hours.  Cardiac Enzymes  Recent Labs Lab 02/11/17 0124  TROPONINI 0.19*    Glucose  Recent Labs Lab 02/04/17 1136 02/10/17 2236  GLUCAP 121* 107*    Imaging Dg Chest 2 View  Result Date: 02/10/2017 CLINICAL DATA:  Weakness nausea vomiting and dyspnea. Recent hospital discharge after coronary stent placement. EXAM: CHEST  2 VIEW COMPARISON:  01/30/2017 FINDINGS: Left  posterior base opacity and effusion, new. Right lung is clear. Pulmonary vasculature is normal. Unchanged moderate cardiomegaly. IMPRESSION: New left base effusion and adjacent airspace opacity. This could be infectious or atelectatic. Unchanged cardiomegaly. Electronically Signed   By: Ellery Plunk M.D.   On: 02/10/2017 21:29   Dg Chest Portable 1 View  Result Date: 02/11/2017 CLINICAL DATA:  Shortness of breath. EXAM: PORTABLE CHEST 1 VIEW COMPARISON:  02/10/2017 FINDINGS: The patient is rotated to the left. The cardiomediastinal silhouette is unchanged. The lungs are mildly hypoinflated. A small left pleural effusion and patchy left  basilar airspace opacity are unchanged. There is unchanged minimal right basilar atelectasis. No pleural effusion is seen. Thoracic spondylosis is noted. IMPRESSION: Unchanged small left pleural effusion and left basilar pneumonia or atelectasis. Electronically Signed   By: Sebastian Ache M.D.   On: 02/11/2017 08:25     STUDIES:  CXR 7/14 > Stable cardiomediastinal silhouette with top-normal heart size. No pneumothorax. No pleural effusion. Mild scarring at the lung bases, stable. No pulmonary edema. No acute consolidative airspace disease CXR 7/25 > New left base effusion and adjacent airspace opacity. This could be infectious or atelectatic. Unchanged cardiomegaly. CT Chest/A/P 7/26 > Third spacing of fluid, with small bilateral pleural effusions, small amount of ascites, flank edema, retroperitoneal edema (left greater than right), and presacral edema. Moderate cardiomegaly with evidence of coronary artery disease. Aortic Atherosclerosis. In the major fissure, small portion of the left pleural effusion appears to be loculated  CULTURES: Blood 7/26 >> Sputum 7/26 >> RVP 7/26 >>  ANTIBIOTICS: Vancomycin 7/25 >7/26 Zosyn 7/25 >7/26 Azithromycin 7/25 >7/26  SIGNIFICANT EVENTS: 7/25 > Presented to ED   LINES/TUBES: PIV   DISCUSSION: 76 year old female with recent STEMI s/p Stent placement presents to ED on 7/25 with productive cough with white sputum production. Upon arrival to ED N/V/D with black tarry stools and coffee ground emesis. Throughout course of night became progressively short of breath and hypotensive (also received 200 mg of metoprolol and 40 meq of lasix). PCCM asked to admit to ICU   ASSESSMENT / PLAN:  PULMONARY A: Acute Hypoxic Respiratory Failure in setting of HCAP vs Pulmonary Edema  H/O Tobacco Abuse (Reportedly Stopped 2 Weeks Ago, Smoker for about 50 years)  -CT Chest with Bilateral Pleural Effusions  P:   Maintain Oxygenation > 92 BIPAP PRN  Trend ABG/CXR    CARDIOVASCULAR A:  Hypotension in setting of polypharmacy vs sepsis  -Throughout night had 40 meq lasix and 200 mg metoprolol  Acute on Chronic Systolic CHF STEMI s/p stents > plans for DAPT for 1 month and then CABG  H/O A.Fib, HTN, CAD, HLD   P:  Cardiac Monitoring  Cardiology Following  Wean Neo to maintain MAP >65 Trend Troponin  ECHO Pending  Hold ASA/Plavix and Xarelto given GI bleed   RENAL A:   Acute on CKD stage 2 (Baseline Crt 0.9)   -Given 1L in ED  Lactic Acidosis  P:   Trend BMP Replace electrolytes as needed Avoid Nephrotoxic medications  Trend Lactic Acid   GASTROINTESTINAL A:   GI Bleed  N/V/D -C.Diff Negative  H/O Gastric Ulcers  P:   NPO CBC q6h  GI consulted  Protonix Gtt  Zofran PRN   HEMATOLOGIC A:   VTE prop P:  Trend CBC q6h SCDs only  Hold anticoagulation in setting of GI bleed   INFECTIOUS A:   ?Sepsis in setting of HCAP -Recent productive cough, no fever, WBC 18.9  -  CT Chest with bilateral Pleural effusions  P:   Trend WBC and Fever Curve  Follow Culture Data RVP Pending  D/C Vancomycin and Zosyn, Azithromycin   ENDOCRINE A:   No issues    P:   Trend Glucose   NEUROLOGIC A:   Acute Metabolic Encephalopathy  P:   Monitor  Avoid Sedative Medications    FAMILY  - Updates: Husband updated at bedside   - Inter-disciplinary family meet or Palliative Care meeting due by: 02/18/2017   CC Time: 45 minutes   Jovita Kussmaul, AGACNP-BC [facility]  Pgr: [phone]  PCCM Pgr: [phone]

## 2017-02-11 NOTE — ED Notes (Signed)
Pt given beef broth per Dr. Corlis Leak.

## 2017-02-11 NOTE — ED Notes (Signed)
Dr. Clyde Lundborg aware of pt BM being tarry and dark, will collect hemoccult card.

## 2017-02-11 NOTE — ED Notes (Addendum)
Pt in resp distress-- mottled, cold, clammy-- air hungry. Family at bedside-- states "shse is worse now than when she got here" pt has no radial or pedal pulses at present, cap refill > 5 sec. Dr. Con Memos notified, resp therapy notified.

## 2017-02-11 NOTE — ED Notes (Addendum)
This nurse assisted pt on bedpan for BM. Stool dark in color and tarry, pt reports similar BM for the past 5 days. Foul smell noted. No liquidy or soft stool noted. Will page Dr. Clyde Lundborg. Pt nauseous with 2 episodes of vomiting, dark colored emesis noted.

## 2017-02-11 NOTE — ED Notes (Signed)
Admitting dr at bedside. BiPap on-- per RT.

## 2017-02-12 DIAGNOSIS — R748 Abnormal levels of other serum enzymes: Secondary | ICD-10-CM

## 2017-02-12 DIAGNOSIS — I255 Ischemic cardiomyopathy: Secondary | ICD-10-CM

## 2017-02-12 DIAGNOSIS — K922 Gastrointestinal hemorrhage, unspecified: Secondary | ICD-10-CM

## 2017-02-12 LAB — BASIC METABOLIC PANEL
ANION GAP: 11 (ref 5–15)
BUN: 63 mg/dL — ABNORMAL HIGH (ref 6–20)
CALCIUM: 7.9 mg/dL — AB (ref 8.9–10.3)
CO2: 18 mmol/L — AB (ref 22–32)
CREATININE: 2.25 mg/dL — AB (ref 0.44–1.00)
Chloride: 106 mmol/L (ref 101–111)
GFR calc non Af Amer: 20 mL/min — ABNORMAL LOW (ref >60)
GFR, EST AFRICAN AMERICAN: 23 mL/min — AB (ref >60)
Glucose, Bld: 125 mg/dL — ABNORMAL HIGH (ref 65–99)
Potassium: 4.7 mmol/L (ref 3.5–5.1)
SODIUM: 135 mmol/L (ref 135–145)

## 2017-02-12 LAB — PROCALCITONIN: PROCALCITONIN: 2.85 ng/mL

## 2017-02-12 LAB — EXPECTORATED SPUTUM ASSESSMENT W GRAM STAIN, RFLX TO RESP C

## 2017-02-12 LAB — EXPECTORATED SPUTUM ASSESSMENT W REFEX TO RESP CULTURE

## 2017-02-12 LAB — MAGNESIUM: MAGNESIUM: 1.9 mg/dL (ref 1.7–2.4)

## 2017-02-12 LAB — LEGIONELLA PNEUMOPHILA SEROGP 1 UR AG: L. PNEUMOPHILA SEROGP 1 UR AG: NEGATIVE

## 2017-02-12 LAB — TROPONIN I: Troponin I: 0.26 ng/mL (ref <0.03)

## 2017-02-12 LAB — PHOSPHORUS: PHOSPHORUS: 6.3 mg/dL — AB (ref 2.5–4.6)

## 2017-02-12 LAB — URINE CULTURE

## 2017-02-12 MED ORDER — DM-GUAIFENESIN ER 30-600 MG PO TB12
1.0000 | ORAL_TABLET | Freq: Two times a day (BID) | ORAL | Status: DC
  Administered 2017-02-12 – 2017-02-18 (×13): 1 via ORAL
  Filled 2017-02-12 (×14): qty 1

## 2017-02-12 MED ORDER — OMEGA-3-ACID ETHYL ESTERS 1 G PO CAPS
1.0000 g | ORAL_CAPSULE | Freq: Every day | ORAL | Status: DC
  Administered 2017-02-12 – 2017-02-18 (×7): 1 g via ORAL
  Filled 2017-02-12 (×7): qty 1

## 2017-02-12 MED ORDER — METOPROLOL TARTRATE 50 MG PO TABS
50.0000 mg | ORAL_TABLET | Freq: Two times a day (BID) | ORAL | Status: DC
  Administered 2017-02-12 – 2017-02-13 (×3): 50 mg via ORAL
  Filled 2017-02-12 (×4): qty 1

## 2017-02-12 MED ORDER — FISH OIL PEARLS 300 MG PO CAPS: 300.0000 mg | ORAL_CAPSULE | Freq: Every day | ORAL | Status: DC

## 2017-02-12 NOTE — Consult Note (Addendum)
Referring Provider:  Dr. Leitha Bleak Primary Care Physician:  Don Broach, [facility] Primary Gastroenterologist:  Unassigned  Reason for Consultation:  GI bleed  HPI: Christy Campbell is a 76 y.o. female with past medical history of STEMI earlier this month status post bare metal stent placement, history of atrial fibrillation on Xarelto  , history of peripheral vascular disease came into the ED with chief complaint of shortness of breath, cough along with nausea vomiting and diarrhea. Patient was also found to have dark stool as well as ??  coffee-ground emesis GI is consulted for further evaluation of GI bleed.   Patient seen and examined at bedside. Patient was complaining of black tarry stool for last few days. She will also complaining of epigastric abdominal pain as well as vomiting. She denied any vomiting of blood or any coffee-ground emesis. History of ulcer disease several years ago. Denied any NSAID use. Denied dysphagia or odynophagia. Patient is complaining of cough. Her shortness of breath is improving.  Family history of colon cancer in her mother in her 66s  Past Medical History:  Diagnosis Date  . Anemia    "long time ago"  . Arthritis    "back; right knee and ankle"  . Dyslipidemia   . GERD (gastroesophageal reflux disease)   . H/O cardiovascular stress test 12/06/08   normal, no ischemia  . History of stomach ulcers 03/01/12   "little bitty ones"  . Hypertension    echo 12/06/08-EF>55%, mild AS and mild pulmonary htn  . Kidney stones   . PVD (peripheral vascular disease) (HCC) 12/29/2011   h/o right common iliac artery stent, left SFA stent and left SFA directional atherectomy 02/29/12  . Tobacco abuse    Quit 01/30/2017    Past Surgical History:  Procedure Laterality Date  . APPENDECTOMY    . ATHERECTOMY  03/01/2012  . ATHERECTOMY N/A 12/29/2011   Procedure: ATHERECTOMY;  Surgeon: Runell Gess, MD;  Location: [facility] CATH LAB;  Service: Cardiovascular;   Laterality: N/A;  . ATHERECTOMY N/A 03/01/2012   Procedure: ATHERECTOMY;  Surgeon: Runell Gess, MD;  Location: [facility] CATH LAB;  Service: Cardiovascular;  Laterality: N/A;  . CARDIOVERSION N/A 08/10/2016   Procedure: CARDIOVERSION;  Surgeon: Thurmon Fair, MD;  Location: [facility] ENDOSCOPY;  Service: Cardiovascular;  Laterality: N/A;  . CHOLECYSTECTOMY    . COLONOSCOPY    . CORONARY/GRAFT ACUTE MI REVASCULARIZATION N/A 01/30/2017   Procedure: Coronary/Graft Acute MI Revascularization;  Surgeon: Swaziland, Peter M, MD;  Location: [facility] INVASIVE CV LAB;  Service: Cardiovascular;  Laterality: N/A;  . EYE SURGERY     Vitrectomy, and bilateral cataracts  . INGUINAL HERNIA REPAIR  1990's   right  . KNEE ARTHROSCOPY  1990's   bilaterally  . LASER PHOTO ABLATION Right 07/27/2013   Procedure: LASER PHOTO ABLATION;  Surgeon: Edmon Crape, MD;  Location: [facility] OR;  Service: Ophthalmology;  Laterality: Right;  . LEFT HEART CATH AND CORONARY ANGIOGRAPHY N/A 01/30/2017   Procedure: Left Heart Cath and Coronary Angiography;  Surgeon: Swaziland, Peter M, MD;  Location: [facility] INVASIVE CV LAB;  Service: Cardiovascular;  Laterality: N/A;  . PARS PLANA VITRECTOMY Right 07/27/2013   Procedure: PARS PLANA VITRECTOMY WITH 25 GAUGE;  Surgeon: Edmon Crape, MD;  Location: [facility] OR;  Service: Ophthalmology;  Laterality: Right;  . PERIPHERAL ARTERIAL STENT GRAFT  03/01/2012   successful directional atheretomy using turbo hawk of a focal prox left SFA lesion   . PV angiogram  12/29/11  successful directinal atherectomy using the turbo hawk to righ SFA stenosis  . PV angiogram  04/17/10   successful PTA/stenting of the left superficial femoral artery and right iliac  . TUBAL LIGATION  1960's  . VAGINAL HYSTERECTOMY  1970's    Prior to Admission medications   Medication Sig Start Date End Date Taking? Authorizing Provider  acetaminophen (TYLENOL) 325 MG tablet Take 325 mg by mouth 2 (two) times daily as needed. For pain   Yes [provider]  aspirin EC 81 MG EC tablet Take 1 tablet (81 mg total) by mouth daily. 02/05/17  Yes Bhagat, Bhavinkumar, PA  atorvastatin (LIPITOR) 80 MG tablet Take 1 tablet (80 mg total) by mouth daily at 6 PM. 02/05/17  Yes Runell Gess, MD  clopidogrel (PLAVIX) 75 MG tablet Take 1 tablet (75 mg total) by mouth daily. 02/05/17  Yes Runell Gess, MD  HYDROcodone-homatropine [facility]) 5-1.5 MG/5ML syrup Take 5 mLs by mouth every 6 (six) hours. 02/04/17  Yes Bhagat, Bhavinkumar, PA  lisinopril (PRINIVIL,ZESTRIL) 20 MG tablet Take 1 tablet (20 mg total) by mouth 2 (two) times daily. Patient taking differently: Take 40 mg by mouth 2 (two) times daily.  02/04/17  Yes Bhagat, Bhavinkumar, PA  lisinopril (PRINIVIL,ZESTRIL) 40 MG tablet Take 40 mg by mouth daily. 11/06/16  Yes [provider]  metoprolol tartrate (LOPRESSOR) 100 MG tablet Take 1 tablet (100 mg total) by mouth 2 (two) times daily. 02/04/17  Yes Bhagat, Bhavinkumar, PA  Multiple Vitamin (MULITIVITAMIN WITH MINERALS) TABS Take 1 tablet by mouth daily.   Yes [provider]  nitroGLYCERIN (NITROSTAT) 0.4 MG SL tablet Place 1 tablet (0.4 mg total) under the tongue every 5 (five) minutes x 3 doses as needed for chest pain. 02/05/17  Yes Runell Gess, MD  Omega-3 Fatty Acids (FISH OIL PEARLS) 300 MG CAPS Take 300 mg by mouth daily.   Yes [provider]  promethazine (PHENERGAN) 12.5 MG tablet Take 12.5 mg by mouth every 6 (six) hours as needed for nausea or vomiting.   Yes [provider]  rivaroxaban (XARELTO) 20 MG TABS tablet Take 1 tablet (20 mg total) by mouth daily with supper. 01/22/17  Yes Runell Gess, MD  vitamin C (ASCORBIC ACID) 500 MG tablet Take 1,000 mg by mouth daily.    Yes [provider]  rOPINIRole (REQUIP) 0.25 MG tablet Take 1 tablet (0.25 mg total) by mouth at bedtime. Patient not taking: Reported on 02/10/2017 02/05/17   Runell Gess, MD    Scheduled Meds: .  dextromethorphan-guaiFENesin  1 tablet Oral BID  . mouth rinse  15 mL Mouth Rinse BID  . metoprolol tartrate  50 mg Oral BID  . nicotine  21 mg Transdermal Daily  . omega-3 acid ethyl esters  1 g Oral Daily   Continuous Infusions: . sodium chloride 10 mL (02/12/17 0209)  . pantoprozole (PROTONIX) infusion 8 mg/hr (02/12/17 0208)  . sodium chloride     PRN Meds:.sodium chloride, ipratropium, levalbuterol, ondansetron  Allergies as of 02/10/2017 - Review Complete 02/10/2017  Allergen Reaction Noted  . Aspirin Nausea And Vomiting and Other (See Comments) 12/16/2011  . Tetanus toxoids Anaphylaxis and Swelling 12/16/2011  . Phenergan [promethazine hcl] Other (See Comments) 02/01/2017  . Hydrocodone Nausea Only 02/10/2017  . Oxycodone Nausea Only 02/10/2017  . Pravastatin  03/23/2013    Family History  Problem Relation Age of Onset  . CVA Mother   . Cancer Mother   .  Pneumonia Father     Social History   Social History  . Marital status: Married    Spouse name: N/A  . Number of children: N/A  . Years of education: N/A   Occupational History  . Retired    Social History Main Topics  . Smoking status: Former Smoker    Packs/day: 0.50    Years: 10.00    Types: Cigarettes    Quit date: 01/30/2017  . Smokeless tobacco: Never Used  . Alcohol use No  . Drug use: No  . Sexual activity: Yes   Other Topics Concern  . Not on file   Social History Narrative   Pt lives with husband in [place], [place]    Review of Systems: Review of Systems  Constitutional: Positive for malaise/fatigue. Negative for chills and fever.  HENT: Negative for hearing loss and tinnitus.   Eyes: Negative for blurred vision and double vision.  Respiratory: Positive for cough and shortness of breath.   Cardiovascular: Positive for palpitations. Negative for chest pain.  Gastrointestinal: Positive for abdominal pain, heartburn, melena, nausea and vomiting.  Skin: Negative for rash.  Neurological:  Positive for weakness.    Physical Exam: Vital signs: Vitals:   02/12/17 1100 02/12/17 1119  BP: 107/69   Pulse: 72   Resp: 20   Temp:  98.5 F (36.9 C)   Last BM Date: 02/11/17 General:   Alert,  Well-developed, well-nourished, pleasant and cooperative in NAD HEENT : Normocephalic, atraumatic, extraocular movement intact. Lungs:  Decreased breath sounds with some rhonchi. No respiratory distress at this time Heart:  Irregularly irregular heart rhythm, no murmur noted Abdomen: Epigastric tenderness to palpation, abdomen is soft, bowel sounds present, no peritoneal signs. LE: 1 + edema. Pulses present Psych : Mood and affect normal. Judgment normal. Alert oriented 3  Rectal:  Deferred  GI:  Lab Results:  Recent Labs  02/11/17 0948 02/11/17 1236 02/11/17 1851  WBC 20.9* 25.8* 28.5*  HGB 12.1 12.0 12.1  HCT 37.5 36.2 37.4  PLT 218 200 214   BMET  Recent Labs  02/10/17 2030 02/12/17 0049  NA 134* 135  K 4.7 4.7  CL 104 106  CO2 19* 18*  GLUCOSE 119* 125*  BUN 51* 63*  CREATININE 1.32* 2.25*  CALCIUM 8.6* 7.9*   LFT No results for input(s): PROT, ALBUMIN, AST, ALT, ALKPHOS, BILITOT, BILIDIR, IBILI in the last 72 hours. PT/INR  Recent Labs  02/11/17 0305  LABPROT 36.5*  INR 3.57     Studies/Results: Ct Abdomen Pelvis Wo Contrast  Result Date: 02/11/2017 CLINICAL DATA:  Nausea, vomiting, diarrhea, and diffuse abdominal pain since yesterday. Pt also became hypoxic while in ED. Pt had a STEMI last week with stent placement. Was d/c home on saturday. EXAM: CT CHEST, ABDOMEN AND PELVIS WITHOUT CONTRAST TECHNIQUE: Multidetector CT imaging of the chest, abdomen and pelvis was performed following the standard protocol without IV contrast. COMPARISON:  Multiple exams, including chest radiograph from 02/11/2017 and CT scan from 07/18/2014. FINDINGS: CT CHEST FINDINGS Cardiovascular: Coronary, aortic arch, and branch vessel atherosclerotic vascular disease. Moderate  cardiomegaly. Mediastinum/Nodes: Right lower paratracheal node at the level the carina measures 1.7 cm in short axis on image 21/4. Other smaller prevascular and paratracheal nodes are observed. Lungs/Pleura: Mild atelectasis in the right middle lobe. Indistinct ground-glass opacities in the superior segment right lower lobe and adjacent portion of the right upper lobe. Subsegmental atelectasis in the left upper lobe and left lower lobe. Bilateral small pleural effusions with  passive atelectasis. Loculated fluid in the left major fissure. Musculoskeletal: The patient was unable to raise her arms for imaging, resulting in streak artifact along the abdomen and pelvis which mildly reduces diagnostic sensitivity and specificity. Thoracic spondylosis. Minimal midthoracic wedging and slight superior endplate concavity at T12. CT ABDOMEN PELVIS FINDINGS Hepatobiliary: Cholecystectomy.  Otherwise unremarkable. Pancreas: Unremarkable Spleen: Unremarkable Adrenals/Urinary Tract: Adrenal glands normal. Suspected scarring in the left kidney upper pole. Abnormal left perirenal stranding/edema without hydronephrosis or appreciable nephrolithiasis. Mild likely chronic right perirenal stranding. Suspected cyst of the right kidney lower pole anteriorly. Borderline fullness of the right ureter without stones identified. Stomach/Bowel: Air fluid level in the rectum suspicious for diarrheal process. Most of the colon is nondistended. Vascular/Lymphatic: Aortoiliac atherosclerotic vascular disease. Reproductive: Uterus not well seen, query hysterectomy. Other: In addition to the left perirenal/retroperitoneal stranding, there is presacral edema which is new compared to 07/18/2014. Small but abnormal amount of free pelvic fluid. Low-level stranding along the pelvic sidewalls. Subcutaneous edema along the lower anterior abdominal walls laterally. Musculoskeletal: Chronic soft tissue defect along the right lateral abdominal wall  musculature posteriorly adjacent to the right kidney and in the subcostal region, not appreciably changed from 2015. Old healed fractures of the right pubic rami. Grade 1 anterolisthesis at L4-5 appears degenerative and is not appreciably changed from 2015. There is lumbar spondylosis and degenerative disc disease with probable central narrowing of the thecal sac at the L4-5 level. Degenerative endplate findings at L5-S1 with vacuum disc phenomenon. I do not see definite cortical discontinuity in the sacrum or a well-defined sacral fracture. IMPRESSION: 1. Third spacing of fluid, with small bilateral pleural effusions, small amount of ascites, flank edema, retroperitoneal edema (left greater than right), and presacral edema. I do not see hydronephrosis for a sacral fracture as a specific cause to suggest an underlying injury or obstruction as a cause for the edema. 2. Moderate cardiomegaly with evidence of coronary artery disease. Aortic Atherosclerosis (ICD10-I70.0). 3. In the major fissure, small portion of the left pleural effusion appears to be loculated. 4. Air-fluid level in the rectum suggestive of diarrheal process. Most of the colon is nondistended. Neither oral nor IV contrast was administered. 5. Lower lumbar spondylosis and degenerative disc disease, with potential impingement at L4-5. 6. There is an enlarged right lower paratracheal lymph node at 1.7 cm diameter. No prior cross-sectional imaging through this region. The appearance is abnormal but nonspecific and could be due to passive congestion from congestive heart failure, or otherwise occult malignancy. This may warrant surveillance for follow up. 7. Chronic defect in the right posterolateral abdominal wall musculature below the right twelfth rib, with absent visualization of the normal muscular tissues in this vicinity. Electronically Signed   By: Gaylyn Rong M.D.   On: 02/11/2017 10:18   Dg Chest 2 View  Result Date:  02/10/2017 CLINICAL DATA:  Weakness nausea vomiting and dyspnea. Recent hospital discharge after coronary stent placement. EXAM: CHEST  2 VIEW COMPARISON:  01/30/2017 FINDINGS: Left posterior base opacity and effusion, new. Right lung is clear. Pulmonary vasculature is normal. Unchanged moderate cardiomegaly. IMPRESSION: New left base effusion and adjacent airspace opacity. This could be infectious or atelectatic. Unchanged cardiomegaly. Electronically Signed   By: Ellery Plunk M.D.   On: 02/10/2017 21:29   Ct Chest Wo Contrast  Result Date: 02/11/2017 CLINICAL DATA:  Nausea, vomiting, diarrhea, and diffuse abdominal pain since yesterday. Pt also became hypoxic while in ED. Pt had a STEMI last week with stent placement. Was  d/c home on saturday. EXAM: CT CHEST, ABDOMEN AND PELVIS WITHOUT CONTRAST TECHNIQUE: Multidetector CT imaging of the chest, abdomen and pelvis was performed following the standard protocol without IV contrast. COMPARISON:  Multiple exams, including chest radiograph from 02/11/2017 and CT scan from 07/18/2014. FINDINGS: CT CHEST FINDINGS Cardiovascular: Coronary, aortic arch, and branch vessel atherosclerotic vascular disease. Moderate cardiomegaly. Mediastinum/Nodes: Right lower paratracheal node at the level the carina measures 1.7 cm in short axis on image 21/4. Other smaller prevascular and paratracheal nodes are observed. Lungs/Pleura: Mild atelectasis in the right middle lobe. Indistinct ground-glass opacities in the superior segment right lower lobe and adjacent portion of the right upper lobe. Subsegmental atelectasis in the left upper lobe and left lower lobe. Bilateral small pleural effusions with passive atelectasis. Loculated fluid in the left major fissure. Musculoskeletal: The patient was unable to raise her arms for imaging, resulting in streak artifact along the abdomen and pelvis which mildly reduces diagnostic sensitivity and specificity. Thoracic spondylosis. Minimal  midthoracic wedging and slight superior endplate concavity at T12. CT ABDOMEN PELVIS FINDINGS Hepatobiliary: Cholecystectomy.  Otherwise unremarkable. Pancreas: Unremarkable Spleen: Unremarkable Adrenals/Urinary Tract: Adrenal glands normal. Suspected scarring in the left kidney upper pole. Abnormal left perirenal stranding/edema without hydronephrosis or appreciable nephrolithiasis. Mild likely chronic right perirenal stranding. Suspected cyst of the right kidney lower pole anteriorly. Borderline fullness of the right ureter without stones identified. Stomach/Bowel: Air fluid level in the rectum suspicious for diarrheal process. Most of the colon is nondistended. Vascular/Lymphatic: Aortoiliac atherosclerotic vascular disease. Reproductive: Uterus not well seen, query hysterectomy. Other: In addition to the left perirenal/retroperitoneal stranding, there is presacral edema which is new compared to 07/18/2014. Small but abnormal amount of free pelvic fluid. Low-level stranding along the pelvic sidewalls. Subcutaneous edema along the lower anterior abdominal walls laterally. Musculoskeletal: Chronic soft tissue defect along the right lateral abdominal wall musculature posteriorly adjacent to the right kidney and in the subcostal region, not appreciably changed from 2015. Old healed fractures of the right pubic rami. Grade 1 anterolisthesis at L4-5 appears degenerative and is not appreciably changed from 2015. There is lumbar spondylosis and degenerative disc disease with probable central narrowing of the thecal sac at the L4-5 level. Degenerative endplate findings at L5-S1 with vacuum disc phenomenon. I do not see definite cortical discontinuity in the sacrum or a well-defined sacral fracture. IMPRESSION: 1. Third spacing of fluid, with small bilateral pleural effusions, small amount of ascites, flank edema, retroperitoneal edema (left greater than right), and presacral edema. I do not see hydronephrosis for a sacral  fracture as a specific cause to suggest an underlying injury or obstruction as a cause for the edema. 2. Moderate cardiomegaly with evidence of coronary artery disease. Aortic Atherosclerosis (ICD10-I70.0). 3. In the major fissure, small portion of the left pleural effusion appears to be loculated. 4. Air-fluid level in the rectum suggestive of diarrheal process. Most of the colon is nondistended. Neither oral nor IV contrast was administered. 5. Lower lumbar spondylosis and degenerative disc disease, with potential impingement at L4-5. 6. There is an enlarged right lower paratracheal lymph node at 1.7 cm diameter. No prior cross-sectional imaging through this region. The appearance is abnormal but nonspecific and could be due to passive congestion from congestive heart failure, or otherwise occult malignancy. This may warrant surveillance for follow up. 7. Chronic defect in the right posterolateral abdominal wall musculature below the right twelfth rib, with absent visualization of the normal muscular tissues in this vicinity. Electronically Signed   By: Zollie Beckers  Ova Freshwater M.D.   On: 02/11/2017 10:18   Dg Chest Portable 1 View  Result Date: 02/11/2017 CLINICAL DATA:  Shortness of breath. EXAM: PORTABLE CHEST 1 VIEW COMPARISON:  02/10/2017 FINDINGS: The patient is rotated to the left. The cardiomediastinal silhouette is unchanged. The lungs are mildly hypoinflated. A small left pleural effusion and patchy left basilar airspace opacity are unchanged. There is unchanged minimal right basilar atelectasis. No pleural effusion is seen. Thoracic spondylosis is noted. IMPRESSION: Unchanged small left pleural effusion and left basilar pneumonia or atelectasis. Electronically Signed   By: Sebastian Ache M.D.   On: 02/11/2017 08:25    Impression/Plan: - Melena while on Plavix and Xarelto. Resolved now. Hemoglobin stable.  - H/O STEMI earlier this month. S/P bare metal stent placement - Acute hypoxic respiratory  failure. Improving. ?? HCAP  - History of A. fib. Anticoagulation on hold since 02/09/2017. Plavix  is also on hold during similar period of time - Family history of colon cancer in mother in her 70s  Recommendations --------------------------- - Patient with multiple medical issues. She is in need for anticoagulation as well as antiplatelet. - Her melena is resolved. Hemoglobin is stable. - Offered conservative management with restarting antiplatelet/ anticoagulation. Patient prefers inpatient workup with endoscopy. - Case discussed with cardiology Dr.Kelly. Okay to proceed with EGD from cardiac standpoint. - Recommend EGD once INR is less than 1.8. Repeat INR for tomorrow. - Okay to continue heart healthy diet for now - Dr. Matthias Hughs will follow up tomorrow.    LOS: 1 day   Kathi Der  MD, FACP 02/12/2017, 1:13 PM  Pager [phone] If no answer or after 5 PM call [phone]

## 2017-02-12 NOTE — Progress Notes (Signed)
Received from 27M, alert and oriented,no distress. Protonix drip infusing. Agree with previous Rn's assessment.

## 2017-02-12 NOTE — Progress Notes (Signed)
Progress Note  Patient Name: Christy Campbell Date of Encounter: 02/12/2017  Primary Cardiologist: Dr. Allyson Sabal  Subjective   No chest pain; no further bright red blood per rectum.  Had black tarry stools last week.  Inpatient Medications    Scheduled Meds: . dextromethorphan-guaiFENesin  1 tablet Oral BID  . mouth rinse  15 mL Mouth Rinse BID  . metoprolol tartrate  50 mg Oral BID  . nicotine  21 mg Transdermal Daily  . omega-3 acid ethyl esters  1 g Oral Daily   Continuous Infusions: . sodium chloride 10 mL (02/12/17 0209)  . pantoprozole (PROTONIX) infusion 8 mg/hr (02/12/17 0208)  . sodium chloride     PRN Meds: sodium chloride, ipratropium, levalbuterol, ondansetron   Vital Signs    Vitals:   02/12/17 1119 02/12/17 1200 02/12/17 1300 02/12/17 1400  BP:  (!) 126/94 (!) 123/101 (!) 129/101  Pulse:  90 76 (!) 56  Resp:  14 (!) 23 19  Temp: 98.5 F (36.9 C)     TempSrc: Oral     SpO2:  97% 97% 93%  Weight:      Height:        Intake/Output Summary (Last 24 hours) at 02/12/17 1422 Last data filed at 02/12/17 1400  Gross per 24 hour  Intake           1578.5 ml  Output              175 ml  Net           1403.5 ml    I/O since admission: +2354  [facility] Weights   02/10/17 2008 02/12/17 0408  Weight: 199 lb (90.3 kg) 204 lb 12.9 oz (92.9 kg)    Telemetry    Atrial fibrillation with rate in the 90-100 range- Personally Reviewed  ECG    ECG (independently read by me): Atrial fibrillation at 105 bpm inferior  Q waves in III and F with T-wave inversion consistent with recent inferior MI.  Physical Exam   BP (!) 129/101   Pulse (!) 56   Temp 98.5 F (36.9 C) (Oral)   Resp 19   Ht 5\' 6"  (1.676 m)   Wt 204 lb 12.9 oz (92.9 kg)   SpO2 93%   BMI 33.06 kg/m  General: Alert, oriented, no distress.  Skin: normal turgor, no rashes, warm and dry HEENT: Normocephalic, atraumatic. Pupils equal round and reactive to light; sclera anicteric; extraocular muscles  intact; Nose without nasal septal hypertrophy Mouth/Parynx benign; Mallinpatti scale 3 Neck: No JVD, no carotid bruits; normal carotid upstroke Lungs: clear to ausculatation and percussion; no wheezing or rales Chest wall: without tenderness to palpitation Heart: Irregularly irregular at 90 - 100 bpm, s1 s2 normal, 1/6 systolic murmur, no diastolic murmur, no rubs, gallops, thrills, or heaves Abdomen: soft, nontender; no hepatosplenomehaly, BS+; abdominal aorta nontender and not dilated by palpation. Back: no CVA tenderness Pulses 2+ Musculoskeletal: full range of motion, normal strength, no joint deformities Extremities: no clubbing cyanosis or edema, Homan's sign negative  Neurologic: grossly nonfocal; Cranial nerves grossly wnl Psychologic: Normal mood and affect    Labs    Chemistry Recent Labs Lab 02/10/17 2030 02/12/17 0049  NA 134* 135  K 4.7 4.7  CL 104 106  CO2 19* 18*  GLUCOSE 119* 125*  BUN 51* 63*  CREATININE 1.32* 2.25*  CALCIUM 8.6* 7.9*  GFRNONAA 38* 20*  GFRAA 45* 23*  ANIONGAP 11 11     Hematology Recent  Labs Lab 02/11/17 0948 02/11/17 1236 02/11/17 1851  WBC 20.9* 25.8* 28.5*  RBC 4.51 4.38 4.60  HGB 12.1 12.0 12.1  HCT 37.5 36.2 37.4  MCV 83.1 82.6 81.3  MCH 26.8 27.4 26.3  MCHC 32.3 33.1 32.4  RDW 17.0* 16.8* 16.5*  PLT 218 200 214    Cardiac Enzymes Recent Labs Lab 02/11/17 1236 02/11/17 1550 02/11/17 1851 02/12/17 0049  TROPONINI 0.21* 0.26* 0.28* 0.26*    Recent Labs Lab 02/10/17 2121  TROPIPOC 0.09*     BNP Recent Labs Lab 02/10/17 2030  BNP 2,783.2*     DDimer  Recent Labs Lab 02/10/17 2030  DDIMER 2.28*     Lipid Panel     Component Value Date/Time   CHOL 142 01/30/2017 2104   TRIG 80 01/30/2017 2104   HDL 41 01/30/2017 2104   CHOLHDL 3.5 01/30/2017 2104   VLDL 16 01/30/2017 2104   LDLCALC 85 01/30/2017 2104    Radiology    Ct Abdomen Pelvis Wo Contrast  Result Date: 02/11/2017 CLINICAL DATA:   Nausea, vomiting, diarrhea, and diffuse abdominal pain since yesterday. Pt also became hypoxic while in ED. Pt had a STEMI last week with stent placement. Was d/c home on saturday. EXAM: CT CHEST, ABDOMEN AND PELVIS WITHOUT CONTRAST TECHNIQUE: Multidetector CT imaging of the chest, abdomen and pelvis was performed following the standard protocol without IV contrast. COMPARISON:  Multiple exams, including chest radiograph from 02/11/2017 and CT scan from 07/18/2014. FINDINGS: CT CHEST FINDINGS Cardiovascular: Coronary, aortic arch, and branch vessel atherosclerotic vascular disease. Moderate cardiomegaly. Mediastinum/Nodes: Right lower paratracheal node at the level the carina measures 1.7 cm in short axis on image 21/4. Other smaller prevascular and paratracheal nodes are observed. Lungs/Pleura: Mild atelectasis in the right middle lobe. Indistinct ground-glass opacities in the superior segment right lower lobe and adjacent portion of the right upper lobe. Subsegmental atelectasis in the left upper lobe and left lower lobe. Bilateral small pleural effusions with passive atelectasis. Loculated fluid in the left major fissure. Musculoskeletal: The patient was unable to raise her arms for imaging, resulting in streak artifact along the abdomen and pelvis which mildly reduces diagnostic sensitivity and specificity. Thoracic spondylosis. Minimal midthoracic wedging and slight superior endplate concavity at T12. CT ABDOMEN PELVIS FINDINGS Hepatobiliary: Cholecystectomy.  Otherwise unremarkable. Pancreas: Unremarkable Spleen: Unremarkable Adrenals/Urinary Tract: Adrenal glands normal. Suspected scarring in the left kidney upper pole. Abnormal left perirenal stranding/edema without hydronephrosis or appreciable nephrolithiasis. Mild likely chronic right perirenal stranding. Suspected cyst of the right kidney lower pole anteriorly. Borderline fullness of the right ureter without stones identified. Stomach/Bowel: Air fluid  level in the rectum suspicious for diarrheal process. Most of the colon is nondistended. Vascular/Lymphatic: Aortoiliac atherosclerotic vascular disease. Reproductive: Uterus not well seen, query hysterectomy. Other: In addition to the left perirenal/retroperitoneal stranding, there is presacral edema which is new compared to 07/18/2014. Small but abnormal amount of free pelvic fluid. Low-level stranding along the pelvic sidewalls. Subcutaneous edema along the lower anterior abdominal walls laterally. Musculoskeletal: Chronic soft tissue defect along the right lateral abdominal wall musculature posteriorly adjacent to the right kidney and in the subcostal region, not appreciably changed from 2015. Old healed fractures of the right pubic rami. Grade 1 anterolisthesis at L4-5 appears degenerative and is not appreciably changed from 2015. There is lumbar spondylosis and degenerative disc disease with probable central narrowing of the thecal sac at the L4-5 level. Degenerative endplate findings at L5-S1 with vacuum disc phenomenon. I do not see definite  cortical discontinuity in the sacrum or a well-defined sacral fracture. IMPRESSION: 1. Third spacing of fluid, with small bilateral pleural effusions, small amount of ascites, flank edema, retroperitoneal edema (left greater than right), and presacral edema. I do not see hydronephrosis for a sacral fracture as a specific cause to suggest an underlying injury or obstruction as a cause for the edema. 2. Moderate cardiomegaly with evidence of coronary artery disease. Aortic Atherosclerosis (ICD10-I70.0). 3. In the major fissure, small portion of the left pleural effusion appears to be loculated. 4. Air-fluid level in the rectum suggestive of diarrheal process. Most of the colon is nondistended. Neither oral nor IV contrast was administered. 5. Lower lumbar spondylosis and degenerative disc disease, with potential impingement at L4-5. 6. There is an enlarged right lower  paratracheal lymph node at 1.7 cm diameter. No prior cross-sectional imaging through this region. The appearance is abnormal but nonspecific and could be due to passive congestion from congestive heart failure, or otherwise occult malignancy. This may warrant surveillance for follow up. 7. Chronic defect in the right posterolateral abdominal wall musculature below the right twelfth rib, with absent visualization of the normal muscular tissues in this vicinity. Electronically Signed   By: Gaylyn Rong M.D.   On: 02/11/2017 10:18   Dg Chest 2 View  Result Date: 02/10/2017 CLINICAL DATA:  Weakness nausea vomiting and dyspnea. Recent hospital discharge after coronary stent placement. EXAM: CHEST  2 VIEW COMPARISON:  01/30/2017 FINDINGS: Left posterior base opacity and effusion, new. Right lung is clear. Pulmonary vasculature is normal. Unchanged moderate cardiomegaly. IMPRESSION: New left base effusion and adjacent airspace opacity. This could be infectious or atelectatic. Unchanged cardiomegaly. Electronically Signed   By: Ellery Plunk M.D.   On: 02/10/2017 21:29   Ct Chest Wo Contrast  Result Date: 02/11/2017 CLINICAL DATA:  Nausea, vomiting, diarrhea, and diffuse abdominal pain since yesterday. Pt also became hypoxic while in ED. Pt had a STEMI last week with stent placement. Was d/c home on saturday. EXAM: CT CHEST, ABDOMEN AND PELVIS WITHOUT CONTRAST TECHNIQUE: Multidetector CT imaging of the chest, abdomen and pelvis was performed following the standard protocol without IV contrast. COMPARISON:  Multiple exams, including chest radiograph from 02/11/2017 and CT scan from 07/18/2014. FINDINGS: CT CHEST FINDINGS Cardiovascular: Coronary, aortic arch, and branch vessel atherosclerotic vascular disease. Moderate cardiomegaly. Mediastinum/Nodes: Right lower paratracheal node at the level the carina measures 1.7 cm in short axis on image 21/4. Other smaller prevascular and paratracheal nodes are  observed. Lungs/Pleura: Mild atelectasis in the right middle lobe. Indistinct ground-glass opacities in the superior segment right lower lobe and adjacent portion of the right upper lobe. Subsegmental atelectasis in the left upper lobe and left lower lobe. Bilateral small pleural effusions with passive atelectasis. Loculated fluid in the left major fissure. Musculoskeletal: The patient was unable to raise her arms for imaging, resulting in streak artifact along the abdomen and pelvis which mildly reduces diagnostic sensitivity and specificity. Thoracic spondylosis. Minimal midthoracic wedging and slight superior endplate concavity at T12. CT ABDOMEN PELVIS FINDINGS Hepatobiliary: Cholecystectomy.  Otherwise unremarkable. Pancreas: Unremarkable Spleen: Unremarkable Adrenals/Urinary Tract: Adrenal glands normal. Suspected scarring in the left kidney upper pole. Abnormal left perirenal stranding/edema without hydronephrosis or appreciable nephrolithiasis. Mild likely chronic right perirenal stranding. Suspected cyst of the right kidney lower pole anteriorly. Borderline fullness of the right ureter without stones identified. Stomach/Bowel: Air fluid level in the rectum suspicious for diarrheal process. Most of the colon is nondistended. Vascular/Lymphatic: Aortoiliac atherosclerotic vascular disease. Reproductive: Uterus  not well seen, query hysterectomy. Other: In addition to the left perirenal/retroperitoneal stranding, there is presacral edema which is new compared to 07/18/2014. Small but abnormal amount of free pelvic fluid. Low-level stranding along the pelvic sidewalls. Subcutaneous edema along the lower anterior abdominal walls laterally. Musculoskeletal: Chronic soft tissue defect along the right lateral abdominal wall musculature posteriorly adjacent to the right kidney and in the subcostal region, not appreciably changed from 2015. Old healed fractures of the right pubic rami. Grade 1 anterolisthesis at L4-5  appears degenerative and is not appreciably changed from 2015. There is lumbar spondylosis and degenerative disc disease with probable central narrowing of the thecal sac at the L4-5 level. Degenerative endplate findings at L5-S1 with vacuum disc phenomenon. I do not see definite cortical discontinuity in the sacrum or a well-defined sacral fracture. IMPRESSION: 1. Third spacing of fluid, with small bilateral pleural effusions, small amount of ascites, flank edema, retroperitoneal edema (left greater than right), and presacral edema. I do not see hydronephrosis for a sacral fracture as a specific cause to suggest an underlying injury or obstruction as a cause for the edema. 2. Moderate cardiomegaly with evidence of coronary artery disease. Aortic Atherosclerosis (ICD10-I70.0). 3. In the major fissure, small portion of the left pleural effusion appears to be loculated. 4. Air-fluid level in the rectum suggestive of diarrheal process. Most of the colon is nondistended. Neither oral nor IV contrast was administered. 5. Lower lumbar spondylosis and degenerative disc disease, with potential impingement at L4-5. 6. There is an enlarged right lower paratracheal lymph node at 1.7 cm diameter. No prior cross-sectional imaging through this region. The appearance is abnormal but nonspecific and could be due to passive congestion from congestive heart failure, or otherwise occult malignancy. This may warrant surveillance for follow up. 7. Chronic defect in the right posterolateral abdominal wall musculature below the right twelfth rib, with absent visualization of the normal muscular tissues in this vicinity. Electronically Signed   By: Gaylyn Rong M.D.   On: 02/11/2017 10:18   Dg Chest Portable 1 View  Result Date: 02/11/2017 CLINICAL DATA:  Shortness of breath. EXAM: PORTABLE CHEST 1 VIEW COMPARISON:  02/10/2017 FINDINGS: The patient is rotated to the left. The cardiomediastinal silhouette is unchanged. The lungs  are mildly hypoinflated. A small left pleural effusion and patchy left basilar airspace opacity are unchanged. There is unchanged minimal right basilar atelectasis. No pleural effusion is seen. Thoracic spondylosis is noted. IMPRESSION: Unchanged small left pleural effusion and left basilar pneumonia or atelectasis. Electronically Signed   By: Sebastian Ache M.D.   On: 02/11/2017 08:25    Cardiac Studies   ------------------------------------------------------------------- 02/11/17 ECHO Study Conclusions  - Left ventricle: The cavity size was normal. There was mild   concentric hypertrophy. Systolic function was moderately to   severely reduced. The estimated ejection fraction was in the   range of 30% to 35 %. Moderate diffuse hypokinesis with distinct   regional wall motion abnormalities. Severe hypokinesis of the   basal-midinferolateral, inferior, and inferoseptal myocardium. - Mitral valve: There was mild to moderate regurgitation directed   centrally. - Left atrium: The atrium was moderately to severely dilated. - Right ventricle: The cavity size was mildly dilated. Systolic   function was moderately reduced. - Right atrium: The atrium was mildly dilated. - Tricuspid valve: There was moderate-severe regurgitation directed   centrally. - Pulmonary arteries: Systolic pressure was moderately increased.   PA peak pressure: 55 mm Hg (S).  Impressions:  - Image quality  has deteriorated compared to February 02, 2017 study.   Nevertheless, overall LV systolic function appears a little worse   and there is more evident RV dysfunction.   Findings are consistent with extensive ischemia/nfarction in the   right coronary artery distribution, with RV involvement.  Patient Profile     MERYN STYER is a 76 y.o. female with a hx of chronic tobacco abuse, HTN, dyslipidemia and PVD with s/p multiple PTA procedures including right common iliac artery stent, left SFA stent, left SFA directional  atherectomy 2013, persistent atrial fibrillation s/p DCCV 07/2016 on Xarelto and recent inferior STEMI  treated with bare metal stenting to her mid RCA on 01/30/17. We were asked to  see her for evaluation of atrial fib at the request of IM/CCM.She also has had dark stools and will need GI assessment of bleeding.  Assessment & Plan    1. AF RVR: Rate improved today, with rate currently around 100 bpm.  Now metoprolol 50 g twice a day.  Xarelto on hold with recent GI bleed.  Last dose on  July 25.  Titrate beta blocker as blood pressure and heart rate allow for more optimal rate control.  2.  Recent inferior STEMI, status post bare-metal stenting to mid RCA on 01/30/2017.  The patient had been on triple drug therapy following her stent with ultimate plans for later CABG.  She has taken her last dose of Plavix on Wednesday, July 25.  She is in the window of potential subacute thrombosis if Plavix is held much longer than tomorrow.  He was fully anticoagulated with her last dose of Xarelto on Wednesday evening, which should be out of her system after 48 hours.  He's not had recurrent anginal symptoms.  Troponins are mildly positive.  Consisting with possible demand ischemia associated with her hypotension and AF with RVR yesterday.  3.  Acute on chronic combined CHF with BNP yesterday 2783.  Troponin 0.26.  EF 30-35% on echo done yesterday which is reduced from several weeks ago with inferior wall motion abnormality secondary to a recent inferior STEMI.  4.  GI bleed.  She had black tarry  stools last week , and bright red blood yesterday.  No active bleeding was noticed today.  She is tentatively scheduled for endoscopy tomorrow.  If this is canceled, she will need to be started on antiplatelet therapy again or consider glycoprotein 2 B3 inhibition with Aggrastat if she is stable from a GI perspective to reduce potential subacute thrombosis of her recently placed stent.  5.  Acute kidney injury.  Creatinine  increased from 1.32 yesterday to 2.25 today.  6.  Leukocytosis: White blood count today increased to 28.5.   Signed, Lennette Bihari, MD, [facility] 02/12/2017, 2:22 PM

## 2017-02-12 NOTE — Progress Notes (Signed)
PULMONARY / CRITICAL CARE MEDICINE   Name: Christy Campbell MRN: [id number] DOB: 1940-12-26    ADMISSION DATE:  02/10/2017 CONSULTATION DATE:  02/11/2017  REFERRING MD:  Dr. Clyde Lundborg   CHIEF COMPLAINT:  Dyspnea   Brief:   76 year old female with PMH of Anemia, Dyslipidemia, GERD, Gastric Ulcers, HTN, PVD, A.Fib on Xarelto, Systolic HF, CKD stage 2, Recent Admission 7/14-7/19 with STEMI with stenting of mid RCA with DES  Presented 7/25 with complaints of cough and dyspnea with white sputum production. Husband reports that patient has also had nausea, vomiting, and diarrhea with mid epigastric pain. Upon arrival to ED nurse noted that patient had dark stool and coffee ground emesis. Hemoccult positive. CT revealed small bilateral pleural effusions.   SUBJECTIVE:  No events overnight. Feels much better   VITAL SIGNS: BP (!) 108/96   Pulse 91   Temp 98.2 F (36.8 C) (Oral)   Resp 12   Ht 5\' 6"  (1.676 m)   Wt 92.9 kg (204 lb 12.9 oz)   SpO2 99%   BMI 33.06 kg/m   HEMODYNAMICS:    VENTILATOR SETTINGS: Vent Mode: BIPAP FiO2 (%):  [50 %] 50 %  INTAKE / OUTPUT: I/O last 3 completed shifts: In: 1869.3 [P.O.:440; I.V.:1029.3; IV Piggyback:400] Out: 100 [Urine:100]  PHYSICAL EXAMINATION: General:  Adult female, no distress  Neuro:  Alert and oriented, follows commands  HEENT:  Normocephalic  Cardiovascular:  A.fib, no MRG Lungs:  Diminished to bases, non-labored  Abdomen:  Obese, tender, active bowels sounds  Musculoskeletal:  +2 edema to BLE Skin:  Warm, dry, intact   LABS:  BMET  Recent Labs Lab 02/10/17 2030 02/12/17 0049  NA 134* 135  K 4.7 4.7  CL 104 106  CO2 19* 18*  BUN 51* 63*  CREATININE 1.32* 2.25*  GLUCOSE 119* 125*    Electrolytes  Recent Labs Lab 02/10/17 2030 02/12/17 0049  CALCIUM 8.6* 7.9*  MG  --  1.9  PHOS  --  6.3*    CBC  Recent Labs Lab 02/11/17 0948 02/11/17 1236 02/11/17 1851  WBC 20.9* 25.8* 28.5*  HGB 12.1 12.0 12.1   HCT 37.5 36.2 37.4  PLT 218 200 214    Coag's  Recent Labs Lab 02/11/17 0305  APTT 30  INR 3.57    Sepsis Markers  Recent Labs Lab 02/11/17 0124  02/11/17 0948 02/11/17 1236 02/11/17 1550 02/12/17 0049  LATICACIDVEN 2.2*  < > 5.2* 2.9* 2.4*  --   PROCALCITON 0.29  --   --   --   --  2.85  < > = values in this interval not displayed.  ABG No results for input(s): PHART, PCO2ART, PO2ART in the last 168 hours.  Liver Enzymes No results for input(s): AST, ALT, ALKPHOS, BILITOT, ALBUMIN in the last 168 hours.  Cardiac Enzymes  Recent Labs Lab 02/11/17 1550 02/11/17 1851 02/12/17 0049  TROPONINI 0.26* 0.28* 0.26*    Glucose  Recent Labs Lab 02/10/17 2236  GLUCAP 107*    Imaging Ct Abdomen Pelvis Wo Contrast  Result Date: 02/11/2017 CLINICAL DATA:  Nausea, vomiting, diarrhea, and diffuse abdominal pain since yesterday. Pt also became hypoxic while in ED. Pt had a STEMI last week with stent placement. Was d/c home on saturday. EXAM: CT CHEST, ABDOMEN AND PELVIS WITHOUT CONTRAST TECHNIQUE: Multidetector CT imaging of the chest, abdomen and pelvis was performed following the standard protocol without IV contrast. COMPARISON:  Multiple exams, including chest radiograph from 02/11/2017 and CT scan  from 07/18/2014. FINDINGS: CT CHEST FINDINGS Cardiovascular: Coronary, aortic arch, and branch vessel atherosclerotic vascular disease. Moderate cardiomegaly. Mediastinum/Nodes: Right lower paratracheal node at the level the carina measures 1.7 cm in short axis on image 21/4. Other smaller prevascular and paratracheal nodes are observed. Lungs/Pleura: Mild atelectasis in the right middle lobe. Indistinct ground-glass opacities in the superior segment right lower lobe and adjacent portion of the right upper lobe. Subsegmental atelectasis in the left upper lobe and left lower lobe. Bilateral small pleural effusions with passive atelectasis. Loculated fluid in the left major fissure.  Musculoskeletal: The patient was unable to raise her arms for imaging, resulting in streak artifact along the abdomen and pelvis which mildly reduces diagnostic sensitivity and specificity. Thoracic spondylosis. Minimal midthoracic wedging and slight superior endplate concavity at T12. CT ABDOMEN PELVIS FINDINGS Hepatobiliary: Cholecystectomy.  Otherwise unremarkable. Pancreas: Unremarkable Spleen: Unremarkable Adrenals/Urinary Tract: Adrenal glands normal. Suspected scarring in the left kidney upper pole. Abnormal left perirenal stranding/edema without hydronephrosis or appreciable nephrolithiasis. Mild likely chronic right perirenal stranding. Suspected cyst of the right kidney lower pole anteriorly. Borderline fullness of the right ureter without stones identified. Stomach/Bowel: Air fluid level in the rectum suspicious for diarrheal process. Most of the colon is nondistended. Vascular/Lymphatic: Aortoiliac atherosclerotic vascular disease. Reproductive: Uterus not well seen, query hysterectomy. Other: In addition to the left perirenal/retroperitoneal stranding, there is presacral edema which is new compared to 07/18/2014. Small but abnormal amount of free pelvic fluid. Low-level stranding along the pelvic sidewalls. Subcutaneous edema along the lower anterior abdominal walls laterally. Musculoskeletal: Chronic soft tissue defect along the right lateral abdominal wall musculature posteriorly adjacent to the right kidney and in the subcostal region, not appreciably changed from 2015. Old healed fractures of the right pubic rami. Grade 1 anterolisthesis at L4-5 appears degenerative and is not appreciably changed from 2015. There is lumbar spondylosis and degenerative disc disease with probable central narrowing of the thecal sac at the L4-5 level. Degenerative endplate findings at L5-S1 with vacuum disc phenomenon. I do not see definite cortical discontinuity in the sacrum or a well-defined sacral fracture.  IMPRESSION: 1. Third spacing of fluid, with small bilateral pleural effusions, small amount of ascites, flank edema, retroperitoneal edema (left greater than right), and presacral edema. I do not see hydronephrosis for a sacral fracture as a specific cause to suggest an underlying injury or obstruction as a cause for the edema. 2. Moderate cardiomegaly with evidence of coronary artery disease. Aortic Atherosclerosis (ICD10-I70.0). 3. In the major fissure, small portion of the left pleural effusion appears to be loculated. 4. Air-fluid level in the rectum suggestive of diarrheal process. Most of the colon is nondistended. Neither oral nor IV contrast was administered. 5. Lower lumbar spondylosis and degenerative disc disease, with potential impingement at L4-5. 6. There is an enlarged right lower paratracheal lymph node at 1.7 cm diameter. No prior cross-sectional imaging through this region. The appearance is abnormal but nonspecific and could be due to passive congestion from congestive heart failure, or otherwise occult malignancy. This may warrant surveillance for follow up. 7. Chronic defect in the right posterolateral abdominal wall musculature below the right twelfth rib, with absent visualization of the normal muscular tissues in this vicinity. Electronically Signed   By: Gaylyn Rong M.D.   On: 02/11/2017 10:18   Ct Chest Wo Contrast  Result Date: 02/11/2017 CLINICAL DATA:  Nausea, vomiting, diarrhea, and diffuse abdominal pain since yesterday. Pt also became hypoxic while in ED. Pt had a STEMI last week  with stent placement. Was d/c home on saturday. EXAM: CT CHEST, ABDOMEN AND PELVIS WITHOUT CONTRAST TECHNIQUE: Multidetector CT imaging of the chest, abdomen and pelvis was performed following the standard protocol without IV contrast. COMPARISON:  Multiple exams, including chest radiograph from 02/11/2017 and CT scan from 07/18/2014. FINDINGS: CT CHEST FINDINGS Cardiovascular: Coronary, aortic  arch, and branch vessel atherosclerotic vascular disease. Moderate cardiomegaly. Mediastinum/Nodes: Right lower paratracheal node at the level the carina measures 1.7 cm in short axis on image 21/4. Other smaller prevascular and paratracheal nodes are observed. Lungs/Pleura: Mild atelectasis in the right middle lobe. Indistinct ground-glass opacities in the superior segment right lower lobe and adjacent portion of the right upper lobe. Subsegmental atelectasis in the left upper lobe and left lower lobe. Bilateral small pleural effusions with passive atelectasis. Loculated fluid in the left major fissure. Musculoskeletal: The patient was unable to raise her arms for imaging, resulting in streak artifact along the abdomen and pelvis which mildly reduces diagnostic sensitivity and specificity. Thoracic spondylosis. Minimal midthoracic wedging and slight superior endplate concavity at T12. CT ABDOMEN PELVIS FINDINGS Hepatobiliary: Cholecystectomy.  Otherwise unremarkable. Pancreas: Unremarkable Spleen: Unremarkable Adrenals/Urinary Tract: Adrenal glands normal. Suspected scarring in the left kidney upper pole. Abnormal left perirenal stranding/edema without hydronephrosis or appreciable nephrolithiasis. Mild likely chronic right perirenal stranding. Suspected cyst of the right kidney lower pole anteriorly. Borderline fullness of the right ureter without stones identified. Stomach/Bowel: Air fluid level in the rectum suspicious for diarrheal process. Most of the colon is nondistended. Vascular/Lymphatic: Aortoiliac atherosclerotic vascular disease. Reproductive: Uterus not well seen, query hysterectomy. Other: In addition to the left perirenal/retroperitoneal stranding, there is presacral edema which is new compared to 07/18/2014. Small but abnormal amount of free pelvic fluid. Low-level stranding along the pelvic sidewalls. Subcutaneous edema along the lower anterior abdominal walls laterally. Musculoskeletal: Chronic  soft tissue defect along the right lateral abdominal wall musculature posteriorly adjacent to the right kidney and in the subcostal region, not appreciably changed from 2015. Old healed fractures of the right pubic rami. Grade 1 anterolisthesis at L4-5 appears degenerative and is not appreciably changed from 2015. There is lumbar spondylosis and degenerative disc disease with probable central narrowing of the thecal sac at the L4-5 level. Degenerative endplate findings at L5-S1 with vacuum disc phenomenon. I do not see definite cortical discontinuity in the sacrum or a well-defined sacral fracture. IMPRESSION: 1. Third spacing of fluid, with small bilateral pleural effusions, small amount of ascites, flank edema, retroperitoneal edema (left greater than right), and presacral edema. I do not see hydronephrosis for a sacral fracture as a specific cause to suggest an underlying injury or obstruction as a cause for the edema. 2. Moderate cardiomegaly with evidence of coronary artery disease. Aortic Atherosclerosis (ICD10-I70.0). 3. In the major fissure, small portion of the left pleural effusion appears to be loculated. 4. Air-fluid level in the rectum suggestive of diarrheal process. Most of the colon is nondistended. Neither oral nor IV contrast was administered. 5. Lower lumbar spondylosis and degenerative disc disease, with potential impingement at L4-5. 6. There is an enlarged right lower paratracheal lymph node at 1.7 cm diameter. No prior cross-sectional imaging through this region. The appearance is abnormal but nonspecific and could be due to passive congestion from congestive heart failure, or otherwise occult malignancy. This may warrant surveillance for follow up. 7. Chronic defect in the right posterolateral abdominal wall musculature below the right twelfth rib, with absent visualization of the normal muscular tissues in this vicinity. Electronically Signed  By: Gaylyn Rong M.D.   On: 02/11/2017  10:18   Dg Chest Portable 1 View  Result Date: 02/11/2017 CLINICAL DATA:  Shortness of breath. EXAM: PORTABLE CHEST 1 VIEW COMPARISON:  02/10/2017 FINDINGS: The patient is rotated to the left. The cardiomediastinal silhouette is unchanged. The lungs are mildly hypoinflated. A small left pleural effusion and patchy left basilar airspace opacity are unchanged. There is unchanged minimal right basilar atelectasis. No pleural effusion is seen. Thoracic spondylosis is noted. IMPRESSION: Unchanged small left pleural effusion and left basilar pneumonia or atelectasis. Electronically Signed   By: Sebastian Ache M.D.   On: 02/11/2017 08:25     STUDIES:  CXR 7/14 > Stable cardiomediastinal silhouette with top-normal heart size. No pneumothorax. No pleural effusion. Mild scarring at the lung bases, stable. No pulmonary edema. No acute consolidative airspace disease CXR 7/25 > New left base effusion and adjacent airspace opacity. This could be infectious or atelectatic. Unchanged cardiomegaly. CT Chest/A/P 7/26 > Third spacing of fluid, with small bilateral pleural effusions, small amount of ascites, flank edema, retroperitoneal edema (left greater than right), and presacral edema. Moderate cardiomegaly with evidence of coronary artery disease. Aortic Atherosclerosis. In the major fissure, small portion of the left pleural effusion appears to be loculated ECHO 7/26 > EF 30-35, PA pressure 55, systolic dysfunction   CULTURES: Urine 7/25 >>  Blood 7/26 >> Sputum 7/26 >> RVP 7/26 > negative   ANTIBIOTICS: Vancomycin 7/25 >7/26 Zosyn 7/25 >7/26 Azithromycin 7/25 >7/26  SIGNIFICANT EVENTS: 7/25 > Presented to ED   LINES/TUBES: PIV   DISCUSSION: 76 year old female with recent STEMI s/p Stent placement presents to ED on 7/25 with productive cough with white sputum production. Upon arrival to ED N/V/D with black tarry stools and coffee ground emesis. Throughout course of night became progressively short of  breath and hypotensive (also received 200 mg of metoprolol and 40 meq of lasix). PCCM asked to admit to ICU   ASSESSMENT / PLAN:  PULMONARY A: Acute Hypoxic Respiratory Failure in setting of HCAP vs Pulmonary Edema  H/O Tobacco Abuse (Reportedly Stopped 2 Weeks Ago, Smoker for about 50 years)  -CT Chest with Bilateral Pleural Effusions  P:   Maintain Oxygenation > 92 Pulmonary Hygiene   CARDIOVASCULAR A:  Hypotension in setting of polypharmacy vs sepsis  -Throughout night had 40 meq lasix and 200 mg metoprolol  Acute on Chronic Systolic CHF STEMI s/p stents > plans for DAPT for 1 month and then CABG  H/O A.Fib, HTN, CAD, HLD   P:  Cardiac Monitoring  Cardiology Following  Restart home fish oil > patient reports she does not take Lipitor at home due to increase pain in legs and arms  Restart metoprolol 50 mg BID (home dose 100 mg bid)  Hold home Lisinopril at this time  Hold ASA/Plavix and Xarelto given GI bleed   RENAL A:   Acute on CKD stage 2 (Baseline Crt 0.9)   -1.32 > 2.25 -Given 1L in ED  Lactic Acidosis > down-trending  P:   Trend BMP Replace electrolytes as needed Avoid Nephrotoxic medications   GASTROINTESTINAL A:   GI Bleed  N/V/D -C.Diff Negative  H/O Gastric Ulcers  P:   GI consulted  CL diet  Trend CBC  Protonix Gtt  Zofran PRN   HEMATOLOGIC A:   VTE prop P:  Trend CBC q6h SCDs only  Hold anticoagulation in setting of GI bleed   INFECTIOUS A:   ?Sepsis in setting of HCAP -  Recent productive cough, no fever, WBC 18.9  -CT Chest with bilateral Pleural effusions  Leukocytosis  P:   Trend WBC and Fever Curve  Follow Culture Data  ENDOCRINE A:   No issues    P:   Trend Glucose   NEUROLOGIC A:   Acute Metabolic Encephalopathy > Improved  P:   Monitor  Avoid Sedative Medications    FAMILY  - Updates: Husband updated at bedside   - Inter-disciplinary family meet or Palliative Care meeting due by: 02/18/2017   Will transfer to  telemetry unit with triad on 7/28, PCCM to sign off   CC Time: 32 minutes   Jovita Kussmaul, AGACNP-BC [facility]  Pgr: [phone]  PCCM Pgr: [phone]

## 2017-02-12 NOTE — Progress Notes (Signed)
Report given to [facility]. Pt being transferred to 3E12 via RN and NT on bed. VS WNL.

## 2017-02-12 NOTE — Care Management Note (Signed)
Case Management Note  Patient Details  Name: NOE PITTSLEY MRN: [id number] Date of Birth: 1940-10-31  Subjective/Objective:     CM following for progression and d/c planning.                Action/Plan: 02/12/2017 Met with pt and husband re pharmacy issues, as on recent visit pt d/c prescriptions were faxed to a mail order pharmacy and had still not arrived at pt home at time of this admission. Pt husband had spent much time attempting to get her meds/new prescriptions sent to a local pharmacy. Both the pt and her husband wish to assure that this does not happen again. This CM spoke with the unit pharmacist, who was able to remove the mail order pharmacy information from the pt record, the current pharmacy of choice is [facility] on [address], [place], [place]. Both pt and husband are in agreement that prescriptions may be faxed to the [facility] in [place].   Expected Discharge Date:                  Expected Discharge Plan:  Home/Self Care  In-House Referral:     Discharge planning Services  CM Consult  Post Acute Care Choice:    Choice offered to:     DME Arranged:    DME Agency:     HH Arranged:    HH Agency:     Status of Service:  In process, will continue to follow  If discussed at Long Length of Stay Meetings, dates discussed:    Additional Comments:  Adron Bene, RN 02/12/2017, 1:42 PM

## 2017-02-13 ENCOUNTER — Encounter (HOSPITAL_COMMUNITY)
Admission: EM | Disposition: A | Discharge status: Home / Self Care | Payer: Self-pay | Source: Home / Self Care | Attending: Internal Medicine

## 2017-02-13 LAB — BASIC METABOLIC PANEL
Anion gap: 9 (ref 5–15)
BUN: 50 mg/dL — AB (ref 6–20)
CO2: 18 mmol/L — ABNORMAL LOW (ref 22–32)
Calcium: 7.4 mg/dL — ABNORMAL LOW (ref 8.9–10.3)
Chloride: 107 mmol/L (ref 101–111)
Creatinine, Ser: 1.74 mg/dL — ABNORMAL HIGH (ref 0.44–1.00)
GFR calc Af Amer: 32 mL/min — ABNORMAL LOW (ref >60)
GFR, EST NON AFRICAN AMERICAN: 27 mL/min — AB (ref >60)
GLUCOSE: 117 mg/dL — AB (ref 65–99)
POTASSIUM: 3.7 mmol/L (ref 3.5–5.1)
SODIUM: 134 mmol/L — AB (ref 135–145)

## 2017-02-13 LAB — MAGNESIUM: Magnesium: 1.8 mg/dL (ref 1.7–2.4)

## 2017-02-13 LAB — CBC
HCT: 37.1 % (ref 36.0–46.0)
HEMATOCRIT: 34.1 % — AB (ref 36.0–46.0)
Hemoglobin: 11.3 g/dL — ABNORMAL LOW (ref 12.0–15.0)
Hemoglobin: 12.1 g/dL (ref 12.0–15.0)
MCH: 26.8 pg (ref 26.0–34.0)
MCH: 26.6 pg (ref 26.0–34.0)
MCHC: 33.1 g/dL (ref 30.0–36.0)
MCHC: 32.6 g/dL (ref 30.0–36.0)
MCV: 81 fL (ref 78.0–100.0)
MCV: 81.5 fL (ref 78.0–100.0)
PLATELETS: 177 10*3/uL (ref 150–400)
PLATELETS: 206 10*3/uL (ref 150–400)
RBC: 4.21 MIL/uL (ref 3.87–5.11)
RBC: 4.55 MIL/uL (ref 3.87–5.11)
RDW: 16.8 % — AB (ref 11.5–15.5)
RDW: 16.6 % — ABNORMAL HIGH (ref 11.5–15.5)
WBC: 18.3 10*3/uL — AB (ref 4.0–10.5)
WBC: 19.2 10*3/uL — AB (ref 4.0–10.5)

## 2017-02-13 LAB — PROTIME-INR
INR: 1.8
Prothrombin Time: 21.1 seconds — ABNORMAL HIGH (ref 11.4–15.2)

## 2017-02-13 LAB — PROCALCITONIN: PROCALCITONIN: 1.66 ng/mL

## 2017-02-13 LAB — PHOSPHORUS: PHOSPHORUS: 3.3 mg/dL (ref 2.5–4.6)

## 2017-02-13 SURGERY — ESOPHAGOGASTRODUODENOSCOPY (EGD) WITH PROPOFOL
Anesthesia: Monitor Anesthesia Care

## 2017-02-13 MED ORDER — TIROFIBAN HCL IV 12.5 MG/250 ML: 11.4600 ug/min | INTRAVENOUS | Status: DC

## 2017-02-13 MED ORDER — METOPROLOL TARTRATE 50 MG PO TABS
75.0000 mg | ORAL_TABLET | Freq: Two times a day (BID) | ORAL | Status: DC
  Administered 2017-02-13 – 2017-02-14 (×2): 75 mg via ORAL
  Filled 2017-02-13 (×2): qty 1

## 2017-02-13 MED ORDER — TIROFIBAN HCL IV 12.5 MG/250 ML
0.0750 ug/kg/min | INTRAVENOUS | Status: AC
  Administered 2017-02-13: 0.075 ug/kg/min via INTRAVENOUS
  Filled 2017-02-13: qty 250

## 2017-02-13 MED ORDER — TIROFIBAN (AGGRASTAT) BOLUS VIA INFUSION: 3825.0000 ug | Freq: Once | INTRAVENOUS | Status: DC

## 2017-02-13 MED ORDER — TIROFIBAN (AGGRASTAT) BOLUS VIA INFUSION
25.0000 ug/kg | Freq: Once | INTRAVENOUS | Status: AC
  Administered 2017-02-13: 2252.5 ug via INTRAVENOUS
  Filled 2017-02-13: qty 46

## 2017-02-13 NOTE — Progress Notes (Signed)
Pharmacist Heart Failure Core Measure Documentation  Assessment: Christy Campbell has an EF documented as 30-35% on 7/26 by Echo.  Rationale: Heart failure patients with left ventricular systolic dysfunction (LVSD) and an EF < 40% should be prescribed an angiotensin converting enzyme inhibitor (ACEI) or angiotensin receptor blocker (ARB) at discharge unless a contraindication is documented in the medical record.  This patient is not currently on an ACEI or ARB for HF.  This note is being placed in the record in order to provide documentation that a contraindication to the use of these agents is present for this encounter.  ACE Inhibitor or Angiotensin Receptor Blocker is contraindicated (specify all that apply)  []   ACEI allergy AND ARB allergy []   Angioedema []   Moderate or severe aortic stenosis []   Hyperkalemia []   Hypotension []   Renal artery stenosis [x]   Worsening renal function, preexisting renal disease or dysfunction   Harland German, Pharm D 02/13/2017 1:51 PM

## 2017-02-13 NOTE — Progress Notes (Signed)
No BMs. No evidence of bleeding.   Patient sitting in bedside chair, eating lunch, appears well.   INR is improved at 1.8, hemoglobin is slightly down from 12.1 to 11.3 over past 24 hours, but during the same time, BUN has dropped from 63 to 50, suggesting that the drop in hemoglobin is due to dilution rather than active bleeding.she remains on a Protonix infusion.  The patient continues to have significant leukocytosis, improved from the past couple of days; it does not appear that she is on antibiotic therapy, and she remains afebrile with questionable atelectasis versus left basilar pneumonia on yesterday's portable chest.  Impression: No evidence of active GI bleeding at this time. Recent melenic stool while on Plavix and Xarelto. Status post recent STEMI (2 weeks ago).  Plan:  Because of this patient's need for ongoing anticoagulation, I do feel that endoscopic evaluation is prudent.  However, since the patient does not appear to be actively bleeding, I feel it should be done semi-electively on Monday with anesthesia support and propofol, in view of the patient's diminished LV function and recent MI which place her at greater risk for sedation related complications. I will schedule her for the procedure at that time, unless instructed otherwise by Cardiology.  Florencia Reasons, M.D. Pager [phone] If no answer or after 5 PM call [phone]

## 2017-02-13 NOTE — Progress Notes (Signed)
Progress Note  Patient Name: Christy Campbell Date of Encounter: 02/13/2017  Primary Cardiologist: Allyson Sabal  Subjective   No Cp  No soB  No bowel movements    Inpatient Medications    Scheduled Meds: . dextromethorphan-guaiFENesin  1 tablet Oral BID  . mouth rinse  15 mL Mouth Rinse BID  . metoprolol tartrate  50 mg Oral BID  . nicotine  21 mg Transdermal Daily  . omega-3 acid ethyl esters  1 g Oral Daily   Continuous Infusions: . sodium chloride 10 mL (02/12/17 0209)  . pantoprozole (PROTONIX) infusion 8 mg/hr (02/12/17 1445)   PRN Meds: sodium chloride, ipratropium, levalbuterol, ondansetron   Vital Signs    Vitals:   02/12/17 2100 02/13/17 0013 02/13/17 0500 02/13/17 0912  BP: (!) 118/91 (!) 130/92 128/80 (!) 145/88  Pulse: 95 87 85 86  Resp: 18 20 20  (!) 77  Temp: (!) 96.8 F (36 C) 97.9 F (36.6 C) 98.1 F (36.7 C) 98.7 F (37.1 C)  TempSrc: Oral Oral Oral Oral  SpO2: 95% 96% 95% 96%  Weight:   198 lb 10.2 oz (90.1 kg)   Height:        Intake/Output Summary (Last 24 hours) at 02/13/17 1015 Last data filed at 02/13/17 0557  Gross per 24 hour  Intake              655 ml  Output              640 ml  Net               15 ml   Filed Weights   02/12/17 0408 02/12/17 1700 02/13/17 0500  Weight: 204 lb 12.9 oz (92.9 kg) 201 lb 12.8 oz (91.5 kg) 198 lb 10.2 oz (90.1 kg)    Telemetry    Atrial fib  90s to 100s  - Personally Reviewed  ECG      Physical Exam   GEN: No acute distress.   Neck: No JVD Cardiac:  irreg irreg  , no murmurs, rubs, or gallops.  Respiratory: Clear to auscultation bilaterally. GI: Soft, nontender, non-distended  MS: Tr edema; No deformity. Neuro:  Nonfocal  Psych: Normal affect   Labs    Chemistry Recent Labs Lab 02/10/17 2030 02/12/17 0049 02/13/17 0438  NA 134* 135 134*  K 4.7 4.7 3.7  CL 104 106 107  CO2 19* 18* 18*  GLUCOSE 119* 125* 117*  BUN 51* 63* 50*  CREATININE 1.32* 2.25* 1.74*  CALCIUM 8.6* 7.9*  7.4*  GFRNONAA 38* 20* 27*  GFRAA 45* 23* 32*  ANIONGAP 11 11 9      Hematology Recent Labs Lab 02/11/17 1236 02/11/17 1851 02/13/17 0438  WBC 25.8* 28.5* 18.3*  RBC 4.38 4.60 4.21  HGB 12.0 12.1 11.3*  HCT 36.2 37.4 34.1*  MCV 82.6 81.3 81.0  MCH 27.4 26.3 26.8  MCHC 33.1 32.4 33.1  RDW 16.8* 16.5* 16.8*  PLT 200 214 177    Cardiac Enzymes Recent Labs Lab 02/11/17 1236 02/11/17 1550 02/11/17 1851 02/12/17 0049  TROPONINI 0.21* 0.26* 0.28* 0.26*    Recent Labs Lab 02/10/17 2121  TROPIPOC 0.09*     BNP Recent Labs Lab 02/10/17 2030  BNP 2,783.2*     DDimer  Recent Labs Lab 02/10/17 2030  DDIMER 2.28*     Radiology    No results found.  Cardiac Studies     Patient Profile       Assessment & Plan  1  Atrial fib   Will increase b blocker for better rate control to 75 bid   Off of anticoagulant    2  CAD  REcent inferior STEMI  BMS to RCA on 01/30/17  Had been on tiple Rx  Last plavix 7/25  aond xarelto on 7/25    3  Systolic Diastolic CHF   lvef 30 to 35%  Volume is up a little but not bad    4  GI bleed  GI is following  Plan for endoscopy    May need to start Aggrastatt if not done today    5  REnal  Cr imporved at 1.74     6  Leukocytoisis    WBC increased but sl less  18.3  Signed, Dietrich Pates, MD  02/13/2017, 10:15 AM

## 2017-02-13 NOTE — Progress Notes (Signed)
PROGRESS NOTE  Christy Campbell WKM:[id number] DOB: February 21, 1941 DOA: 02/10/2017 PCP: Don Broach, [facility]  Patient was discharged from the hospital after treated for STEMI from 7/14-7/19, she was taken back to [place] ER on 7/25 evening due to sob, cough, and blood in the stool, she developed Hypotension sbp in 80's , respiratory distress RR in 40's , lethargy early am on 7/26 possible from chf exacerbation, gi bleed, ? Sepsis (with leukocytosis, lactic acidosis, though no fever),she was not stable to admit to stepdown, she may need pressors , critical care consulted, she was admitted to icu She received one dose of vanc/zosyn/zithro in the ER, she received lasix, she has improved, she is transferred out of icu and back on hospitalist service.   HPI/Recap of past 24 hours:  Transferred out of ICU, she reports feeling better, denies pain, no sob, no bm since Thursday Cough is better, less edema, no fever Husband at bedside   Assessment/Plan: Principal Problem:   Acute on chronic respiratory failure with hypoxia (HCC) Active Problems:   Dyslipidemia   Smoker   Atrial fibrillation with RVR (HCC)   Nausea vomiting and diarrhea   CAD (coronary artery disease)   HCAP (healthcare-associated pneumonia)   Sepsis (HCC)   Elevated troponin   Acute renal failure superimposed on stage 2 chronic kidney disease (HCC)   GIB (gastrointestinal bleeding)   Acute on chronic systolic CHF (congestive heart failure) (HCC)   Acute respiratory failure with hypoxia (HCC)   Ischemic cardiomyopathy    GI bleed, preseted with epigastric pain, FOBT +, hgb 12-11.3 Asa/plavix/xarelto held since admission on 7/25, she is on ppi drip, [facility], plan for EGD on Monday Will follow gi recommendations  Acute on chronic systolic CHF exacerbation:  lvef in 7/17 was 45-50% Repeat echo on 7/26, lvef down to 30-35% Ct chest  On 7/26 " Third spacing of fluid, with small bilateral pleural  effusions, small amount of ascites, flank edema, retroperitoneal edema (left greater than right), and presacral edema." She received lasix on 7/26 Cardiology is following, meds per cardiology  CAD with Recent h/o STEMI with three vessel disease, s/p stent on 01/30/2017, plan to have CABG after a few weeks DAPT,, patient denies chest pain, she had epigastric pain on admission, DAPT therapy held due to concerns of GI bleed Cardiology consulted, she is started on aggrastat on 7/28   AFib/RVR:  course complicated with hypotension, gi bleed initally Currently rate controlled on metoprolol 75mg  bid Anticoagulation held, cardiology consulted.  AKI on CKD III - in the setting of gi bleed , chf and possible sepsis,  -ua with many bacteremia, wbc 0-5, rbc 0-5, negative nitrite, negative leukocyte esterase, urine culture multiple organisms, non predominant  -cr peaked at 2.25, trending down -renal dosing meds, repeat bmp in am. Hold lisinopril  Leukocytosis: procalcitonin peaked at 2.85 on 7/27 trending down, lactic acidosis peaked at 5.2 on 7/26 , trended down, wbc peaked at 28.5 on 7/26, trended down No fever, blood culture no growth, urine culture multiple organisms, non predominant , sputum culture in process, She received vanc/zosyn/zithr in the ED on 7/25, she has been observed off abx     Code Status: full  Family Communication: patient , husband at bedside  Disposition Plan: not ready for discharge   Consultants:  Critical care  Cardiology  [facility]  Procedures:  bipap  Antibiotics:  Vanc/zosyn/     Objective: BP (!) 145/88 (BP Location: Left Arm)   Pulse  86   Temp 98.7 F (37.1 C) (Oral)   Resp (!) 77   Ht 5\' 6"  (1.676 m)   Wt 90.1 kg (198 lb 10.2 oz)   SpO2 96%   BMI 32.06 kg/m   Intake/Output Summary (Last 24 hours) at 02/13/17 1351 Last data filed at 02/13/17 0557  Gross per 24 hour  Intake              310 ml  Output              640 ml   Net             -330 ml   Filed Weights   02/12/17 0408 02/12/17 1700 02/13/17 0500  Weight: 92.9 kg (204 lb 12.9 oz) 91.5 kg (201 lb 12.8 oz) 90.1 kg (198 lb 10.2 oz)    Exam:   General:  NAD  Cardiovascular: IRRR  Respiratory: diminished at basis, no wheezing, no rhonchi  Abdomen: Soft/ND/NT, positive BS  Musculoskeletal: trace pitting Edema presented on admission has almost resolved, legs are softer  Neuro: aaox3  Data Reviewed: Basic Metabolic Panel:  Recent Labs Lab 02/10/17 2030 02/12/17 0049 02/13/17 0438  NA 134* 135 134*  K 4.7 4.7 3.7  CL 104 106 107  CO2 19* 18* 18*  GLUCOSE 119* 125* 117*  BUN 51* 63* 50*  CREATININE 1.32* 2.25* 1.74*  CALCIUM 8.6* 7.9* 7.4*  MG  --  1.9 1.8  PHOS  --  6.3* 3.3   Liver Function Tests: No results for input(s): AST, ALT, ALKPHOS, BILITOT, PROT, ALBUMIN in the last 168 hours.  Recent Labs Lab 02/11/17 0124  LIPASE 21   No results for input(s): AMMONIA in the last 168 hours. CBC:  Recent Labs Lab 02/11/17 0321 02/11/17 0948 02/11/17 1236 02/11/17 1851 02/13/17 0438  WBC 19.0* 20.9* 25.8* 28.5* 18.3*  HGB 11.7* 12.1 12.0 12.1 11.3*  HCT 34.7* 37.5 36.2 37.4 34.1*  MCV 80.7 83.1 82.6 81.3 81.0  PLT 230 218 200 214 177   Cardiac Enzymes:    Recent Labs Lab 02/11/17 0948 02/11/17 1236 02/11/17 1550 02/11/17 1851 02/12/17 0049  TROPONINI 0.20* 0.21* 0.26* 0.28* 0.26*   BNP (last 3 results)  Recent Labs  02/10/17 2030  BNP 2,783.2*    ProBNP (last 3 results) No results for input(s): PROBNP in the last 8760 hours.  CBG:  Recent Labs Lab 02/10/17 2236  GLUCAP 107*    Recent Results (from the past 240 hour(s))  Culture, Urine     Status: Abnormal   Collection Time: 02/10/17 10:57 PM  Result Value Ref Range Status   Specimen Description URINE, RANDOM  Final   Special Requests NONE  Final   Culture MULTIPLE ORGANISMS PRESENT, NONE PREDOMINANT (A)  Final   Report Status 02/12/2017  FINAL  Final  Respiratory Panel by PCR     Status: None   Collection Time: 02/11/17  1:54 AM  Result Value Ref Range Status   Adenovirus NOT DETECTED NOT DETECTED Final   Coronavirus 229E NOT DETECTED NOT DETECTED Final   Coronavirus HKU1 NOT DETECTED NOT DETECTED Final   Coronavirus NL63 NOT DETECTED NOT DETECTED Final   Coronavirus OC43 NOT DETECTED NOT DETECTED Final   Metapneumovirus NOT DETECTED NOT DETECTED Final   Rhinovirus / Enterovirus NOT DETECTED NOT DETECTED Final   Influenza A NOT DETECTED NOT DETECTED Final   Influenza B NOT DETECTED NOT DETECTED Final   Parainfluenza Virus 1 NOT DETECTED NOT DETECTED Final  Parainfluenza Virus 2 NOT DETECTED NOT DETECTED Final   Parainfluenza Virus 3 NOT DETECTED NOT DETECTED Final   Parainfluenza Virus 4 NOT DETECTED NOT DETECTED Final   Respiratory Syncytial Virus NOT DETECTED NOT DETECTED Final   Bordetella pertussis NOT DETECTED NOT DETECTED Final   Chlamydophila pneumoniae NOT DETECTED NOT DETECTED Final   Mycoplasma pneumoniae NOT DETECTED NOT DETECTED Final  C difficile quick screen w PCR reflex     Status: None   Collection Time: 02/11/17  1:55 AM  Result Value Ref Range Status   C Diff antigen NEGATIVE NEGATIVE Final   C Diff toxin NEGATIVE NEGATIVE Final   C Diff interpretation No C. difficile detected.  Final  Culture, blood (routine x 2) Call MD if unable to obtain prior to antibiotics being given     Status: None (Preliminary result)   Collection Time: 02/11/17  9:53 AM  Result Value Ref Range Status   Specimen Description BLOOD LEFT ANTECUBITAL  Final   Special Requests IN PEDIATRIC BOTTLE Blood Culture adequate volume  Final   Culture NO GROWTH 2 DAYS  Final   Report Status PENDING  Incomplete  MRSA PCR Screening     Status: None   Collection Time: 02/11/17  9:56 AM  Result Value Ref Range Status   MRSA by PCR NEGATIVE NEGATIVE Final    Comment:        The GeneXpert MRSA Assay (FDA approved for NASAL  specimens only), is one component of a comprehensive MRSA colonization surveillance program. It is not intended to diagnose MRSA infection nor to guide or monitor treatment for MRSA infections.   Culture, blood (routine x 2) Call MD if unable to obtain prior to antibiotics being given     Status: None (Preliminary result)   Collection Time: 02/11/17 10:00 AM  Result Value Ref Range Status   Specimen Description BLOOD LEFT ARM  Final   Special Requests IN PEDIATRIC BOTTLE Blood Culture adequate volume  Final   Culture NO GROWTH 2 DAYS  Final   Report Status PENDING  Incomplete  Culture, sputum-assessment     Status: None   Collection Time: 02/12/17  9:54 AM  Result Value Ref Range Status   Specimen Description SPUTUM  Final   Special Requests NONE  Final   Sputum evaluation THIS SPECIMEN IS ACCEPTABLE FOR SPUTUM CULTURE  Final   Report Status 02/12/2017 FINAL  Final  Culture, respiratory (NON-Expectorated)     Status: None (Preliminary result)   Collection Time: 02/12/17  9:54 AM  Result Value Ref Range Status   Specimen Description SPUTUM  Final   Special Requests NONE Reflexed from [id number]  Final   Gram Stain   Final    MODERATE WBC PRESENT, PREDOMINANTLY PMN RARE GRAM POSITIVE COCCI    Culture CULTURE REINCUBATED FOR BETTER GROWTH  Final   Report Status PENDING  Incomplete     Studies: No results found.  Scheduled Meds: . dextromethorphan-guaiFENesin  1 tablet Oral BID  . mouth rinse  15 mL Mouth Rinse BID  . metoprolol tartrate  75 mg Oral BID  . nicotine  21 mg Transdermal Daily  . omega-3 acid ethyl esters  1 g Oral Daily  . tirofiban  3,825 mcg Intravenous Once    Continuous Infusions: . sodium chloride 10 mL (02/12/17 0209)  . pantoprozole (PROTONIX) infusion 8 mg/hr (02/12/17 1445)  . tirofiban       Time spent:  Cristle Jared MD, PhD  [facility] Pager [phone]. If  7PM-7AM, please contact night-coverage at [email or web address], password  [facility] 02/13/2017, 1:51 PM  LOS: 2 days

## 2017-02-14 LAB — BASIC METABOLIC PANEL
ANION GAP: 8 (ref 5–15)
BUN: 35 mg/dL — ABNORMAL HIGH (ref 6–20)
CO2: 20 mmol/L — ABNORMAL LOW (ref 22–32)
Calcium: 8 mg/dL — ABNORMAL LOW (ref 8.9–10.3)
Chloride: 108 mmol/L (ref 101–111)
Creatinine, Ser: 1.27 mg/dL — ABNORMAL HIGH (ref 0.44–1.00)
GFR, EST AFRICAN AMERICAN: 47 mL/min — AB (ref >60)
GFR, EST NON AFRICAN AMERICAN: 40 mL/min — AB (ref >60)
GLUCOSE: 110 mg/dL — AB (ref 65–99)
POTASSIUM: 4.3 mmol/L (ref 3.5–5.1)
Sodium: 136 mmol/L (ref 135–145)

## 2017-02-14 LAB — CULTURE, RESPIRATORY W GRAM STAIN: Culture: NORMAL

## 2017-02-14 LAB — CBC
HEMATOCRIT: 36.9 % (ref 36.0–46.0)
HEMOGLOBIN: 12.2 g/dL (ref 12.0–15.0)
MCH: 26.8 pg (ref 26.0–34.0)
MCHC: 33.1 g/dL (ref 30.0–36.0)
MCV: 80.9 fL (ref 78.0–100.0)
Platelets: 206 10*3/uL (ref 150–400)
RBC: 4.56 MIL/uL (ref 3.87–5.11)
RDW: 16.9 % — ABNORMAL HIGH (ref 11.5–15.5)
WBC: 17.9 10*3/uL — AB (ref 4.0–10.5)

## 2017-02-14 LAB — CULTURE, RESPIRATORY

## 2017-02-14 MED ORDER — PANTOPRAZOLE SODIUM 40 MG PO TBEC
40.0000 mg | DELAYED_RELEASE_TABLET | Freq: Two times a day (BID) | ORAL | Status: DC
  Administered 2017-02-14 – 2017-02-18 (×9): 40 mg via ORAL
  Filled 2017-02-14 (×10): qty 1

## 2017-02-14 MED ORDER — FUROSEMIDE 10 MG/ML IJ SOLN
80.0000 mg | Freq: Once | INTRAMUSCULAR | Status: AC
  Administered 2017-02-14: 80 mg via INTRAVENOUS
  Filled 2017-02-14: qty 8

## 2017-02-14 MED ORDER — METOPROLOL TARTRATE 100 MG PO TABS
100.0000 mg | ORAL_TABLET | Freq: Two times a day (BID) | ORAL | Status: DC
Start: 2017-02-14 — End: 2017-02-18
  Administered 2017-02-14 – 2017-02-18 (×8): 100 mg via ORAL
  Filled 2017-02-14 (×8): qty 1

## 2017-02-14 MED ORDER — TIROFIBAN HCL IV 12.5 MG/250 ML
0.0750 ug/kg/min | INTRAVENOUS | Status: DC
  Administered 2017-02-14 – 2017-02-15 (×2): 0.075 ug/kg/min via INTRAVENOUS
  Filled 2017-02-14 (×4): qty 250

## 2017-02-14 MED ORDER — ATORVASTATIN CALCIUM 80 MG PO TABS
80.0000 mg | ORAL_TABLET | Freq: Every day | ORAL | Status: DC
  Administered 2017-02-14 – 2017-02-17 (×4): 80 mg via ORAL
  Filled 2017-02-14 (×5): qty 1

## 2017-02-14 NOTE — Progress Notes (Signed)
Feeling well from GI tract standpoint, tolerating solid diet without nausea. Had a normal "yellow" BM today (no blood, no melena). Hemoglobin stable over past 24 hours. Has been converted to oral Protonix per request of pharmacy.  Patient is scheduled for endoscopic evaluation tomorrow.  Florencia Reasons, M.D. Pager [phone] If no answer or after 5 PM call [phone]

## 2017-02-14 NOTE — Progress Notes (Signed)
Progress Note  Patient Name: Rosealyn Little Lantigua Date of Encounter: 02/14/2017  Primary Cardiologist: Allyson Sabal  Subjective   Breathing is OK  No CP    Inpatient Medications    Scheduled Meds: . dextromethorphan-guaiFENesin  1 tablet Oral BID  . mouth rinse  15 mL Mouth Rinse BID  . metoprolol tartrate  75 mg Oral BID  . nicotine  21 mg Transdermal Daily  . omega-3 acid ethyl esters  1 g Oral Daily   Continuous Infusions: . sodium chloride 10 mL (02/12/17 0209)  . pantoprozole (PROTONIX) infusion 8 mg/hr (02/12/17 1445)  . tirofiban 0.075 mcg/kg/min (02/13/17 1550)   PRN Meds: sodium chloride, ipratropium, levalbuterol, ondansetron   Vital Signs    Vitals:   02/13/17 0912 02/13/17 1300 02/13/17 2041 02/14/17 0619  BP: (!) 145/88 118/72 (!) 142/88 (!) 154/95  Pulse: 86 98 80 83  Resp: (!) 77 (!) 98  18  Temp: 98.7 F (37.1 C) 98.1 F (36.7 C) 98 F (36.7 C) 98.3 F (36.8 C)  TempSrc: Oral Oral Oral Oral  SpO2: 96% 95% 96% 93%  Weight:      Height:        Intake/Output Summary (Last 24 hours) at 02/14/17 0753 Last data filed at 02/13/17 2211  Gross per 24 hour  Intake          1682.18 ml  Output              600 ml  Net          1082.18 ml   Filed Weights   02/12/17 0408 02/12/17 1700 02/13/17 0500  Weight: 204 lb 12.9 oz (92.9 kg) 201 lb 12.8 oz (91.5 kg) 198 lb 10.2 oz (90.1 kg)    Telemetry    Atrial fib 90 to 100- Personally Reviewed  ECG      Physical Exam   GEN: No acute distress.   Neck: No JVD Cardiac:  irreg irreg  , no murmurs, rubs, or gallops.  Respiratory: Clear to auscultation bilaterally. GI: Soft, nontender, non-distended  MS: 1+ edema; No deformity. Neuro:  Nonfocal  Psych: Normal affect   Labs    Chemistry  Recent Labs Lab 02/12/17 0049 02/13/17 0438 02/14/17 0443  NA 135 134* 136  K 4.7 3.7 4.3  CL 106 107 108  CO2 18* 18* 20*  GLUCOSE 125* 117* 110*  BUN 63* 50* 35*  CREATININE 2.25* 1.74* 1.27*  CALCIUM 7.9*  7.4* 8.0*  GFRNONAA 20* 27* 40*  GFRAA 23* 32* 47*  ANIONGAP 11 9 8      Hematology  Recent Labs Lab 02/13/17 0438 02/13/17 2007 02/14/17 0443  WBC 18.3* 19.2* 17.9*  RBC 4.21 4.55 4.56  HGB 11.3* 12.1 12.2  HCT 34.1* 37.1 36.9  MCV 81.0 81.5 80.9  MCH 26.8 26.6 26.8  MCHC 33.1 32.6 33.1  RDW 16.8* 16.6* 16.9*  PLT 177 206 206    Cardiac Enzymes  Recent Labs Lab 02/11/17 1236 02/11/17 1550 02/11/17 1851 02/12/17 0049  TROPONINI 0.21* 0.26* 0.28* 0.26*     Recent Labs Lab 02/10/17 2121  TROPIPOC 0.09*     BNP  Recent Labs Lab 02/10/17 2030  BNP 2,783.2*     DDimer   Recent Labs Lab 02/10/17 2030  DDIMER 2.28*     Radiology    No results found.  Cardiac Studies     Patient Profile       Assessment & Plan    1  Atrial fib  Will increase b blocker for better rate control to 100  bid   Off of anticoagulant    2  CAD  REcent inferior STEMI  BMS to RCA on 01/30/17  Had been on tiple Rx  Last plavix 7/25  aond xarelto on 7/25   Aggrastat started yesterday    3  Systolic Diastolic CHF   lvef 30 to 35%  Volume is up more today  WIll give lasix 80 x 1   Follow response   4  GI bleed  GI is following  Plan for endoscopy tomorrow   5  REnal  Cr imporved at 1.27    6  Leukocytoisis    WBC increased but sl less  17.9  Signed, Dietrich Pates, MD  02/14/2017, 7:53 AM

## 2017-02-14 NOTE — Progress Notes (Signed)
PROGRESS NOTE  Christy Campbell ZOX:[id number] DOB: 12-11-1940 DOA: 02/10/2017 PCP: Don Broach, [facility]  Patient was discharged from the hospital after treated for STEMI from 7/14-7/19, she was taken back to [place] ER on 7/25 evening due to sob, cough, and blood in the stool, she developed Hypotension sbp in 80's , respiratory distress RR in 40's , lethargy early am on 7/26 possible from chf exacerbation, gi bleed, ? Sepsis (with leukocytosis, lactic acidosis, though no fever),she was not stable to admit to stepdown, she may need pressors , critical care consulted, she was admitted to icu She received one dose of vanc/zosyn/zithro in the ER, she received lasix, she has improved, she is transferred out of icu and back on hospitalist service.   HPI/Recap of past 24 hours:  she reports feeling better, denies pain, no sob, no bm since Thursday Cough is better, less edema, no fever Daughter at bedside   Assessment/Plan: Principal Problem:   Acute on chronic respiratory failure with hypoxia (HCC) Active Problems:   Dyslipidemia   Smoker   Atrial fibrillation with RVR (HCC)   Nausea vomiting and diarrhea   CAD (coronary artery disease)   HCAP (healthcare-associated pneumonia)   Sepsis (HCC)   Elevated troponin   Acute renal failure superimposed on stage 2 chronic kidney disease (HCC)   GIB (gastrointestinal bleeding)   Acute on chronic systolic CHF (congestive heart failure) (HCC)   Acute respiratory failure with hypoxia (HCC)   Ischemic cardiomyopathy    GI bleed, preseted with epigastric pain, FOBT +, hgb 12-11.3-12 Asa/plavix/xarelto held since admission on 7/25, she is on ppi drip, [facility], plan for EGD on Monday Will follow gi recommendations  Acute on chronic systolic CHF exacerbation:  -lvef in 7/17 was 45-50% -Repeat echo on 7/26, lvef down to 30-35% -Ct chest  On 7/26 " Third spacing of fluid, with small bilateral pleural effusions, small  amount of ascites, flank edema, retroperitoneal edema (left greater than right), and presacral edema." -She received lasix on 7/26, then on 7/29 -Cardiology is following, meds per cardiology  CAD with Recent h/o STEMI with three vessel disease, s/p stent on 01/30/2017, plan to have CABG after a few weeks DAPT, -patient denies chest pain, she had epigastric pain on admission, DAPT therapy held due to concerns of GI bleed -Cardiology consulted, she is started on aggrastat on 7/28   AFib/RVR:  -course complicated with hypotension, gi bleed initally -Currently rate controlled on metoprolol 100mg  bid (dose increased on 7/29) -Anticoagulation held, cardiology consulted. meds per cardiology  AKI on CKD III - in the setting of gi bleed , chf and possible sepsis,  -ua with many bacteremia, wbc 0-5, rbc 0-5, negative nitrite, negative leukocyte esterase, urine culture multiple organisms, non predominant  -cr peaked at 2.25, trending down -renal dosing meds, repeat bmp in am. Hold lisinopril  Leukocytosis: -procalcitonin peaked at 2.85 on 7/27 trending down, lactic acidosis peaked at 5.2 on 7/26 , trended down, wbc peaked at 28.5 on 7/26, trended down -No fever, blood culture no growth, urine culture multiple organisms, non predominant , sputum culture with normal respiratory flora -She received vanc/zosyn/zithr in the ED on 7/25, she has been observed off abx     Code Status: full  Family Communication: patient , daughter at bedside  Disposition Plan: not ready for discharge   Consultants:  Critical care  Cardiology  [facility]  Procedures:  bipap  Antibiotics:  Uvaldo Bristle Jan Fireman on 7/25  Objective: BP (!) 137/100 (BP Location: Left Arm)   Pulse 100   Temp 98.3 F (36.8 C) (Oral)   Resp 18   Ht 5\' 6"  (1.676 m)   Wt 94.2 kg (207 lb 9.6 oz)   SpO2 96%   BMI 33.51 kg/m   Intake/Output Summary (Last 24 hours) at 02/14/17 1300 Last data filed at 02/14/17  1241  Gross per 24 hour  Intake          1682.18 ml  Output             1020 ml  Net           662.18 ml   Filed Weights   02/12/17 1700 02/13/17 0500 02/14/17 1147  Weight: 91.5 kg (201 lb 12.8 oz) 90.1 kg (198 lb 10.2 oz) 94.2 kg (207 lb 9.6 oz)    Exam:   General:  NAD  Cardiovascular: IRRR  Respiratory: diminished at basis, no wheezing, no rhonchi  Abdomen: Soft/ND/NT, positive BS  Musculoskeletal: legs are litter tight today compare to yesterday  Neuro: aaox3  Data Reviewed: Basic Metabolic Panel:  Recent Labs Lab 02/10/17 2030 02/12/17 0049 02/13/17 0438 02/14/17 0443  NA 134* 135 134* 136  K 4.7 4.7 3.7 4.3  CL 104 106 107 108  CO2 19* 18* 18* 20*  GLUCOSE 119* 125* 117* 110*  BUN 51* 63* 50* 35*  CREATININE 1.32* 2.25* 1.74* 1.27*  CALCIUM 8.6* 7.9* 7.4* 8.0*  MG  --  1.9 1.8  --   PHOS  --  6.3* 3.3  --    Liver Function Tests: No results for input(s): AST, ALT, ALKPHOS, BILITOT, PROT, ALBUMIN in the last 168 hours.  Recent Labs Lab 02/11/17 0124  LIPASE 21   No results for input(s): AMMONIA in the last 168 hours. CBC:  Recent Labs Lab 02/11/17 1236 02/11/17 1851 02/13/17 0438 02/13/17 2007 02/14/17 0443  WBC 25.8* 28.5* 18.3* 19.2* 17.9*  HGB 12.0 12.1 11.3* 12.1 12.2  HCT 36.2 37.4 34.1* 37.1 36.9  MCV 82.6 81.3 81.0 81.5 80.9  PLT 200 214 177 206 206   Cardiac Enzymes:    Recent Labs Lab 02/11/17 0948 02/11/17 1236 02/11/17 1550 02/11/17 1851 02/12/17 0049  TROPONINI 0.20* 0.21* 0.26* 0.28* 0.26*   BNP (last 3 results)  Recent Labs  02/10/17 2030  BNP 2,783.2*    ProBNP (last 3 results) No results for input(s): PROBNP in the last 8760 hours.  CBG:  Recent Labs Lab 02/10/17 2236  GLUCAP 107*    Recent Results (from the past 240 hour(s))  Culture, Urine     Status: Abnormal   Collection Time: 02/10/17 10:57 PM  Result Value Ref Range Status   Specimen Description URINE, RANDOM  Final   Special Requests  NONE  Final   Culture MULTIPLE ORGANISMS PRESENT, NONE PREDOMINANT (A)  Final   Report Status 02/12/2017 FINAL  Final  Respiratory Panel by PCR     Status: None   Collection Time: 02/11/17  1:54 AM  Result Value Ref Range Status   Adenovirus NOT DETECTED NOT DETECTED Final   Coronavirus 229E NOT DETECTED NOT DETECTED Final   Coronavirus HKU1 NOT DETECTED NOT DETECTED Final   Coronavirus NL63 NOT DETECTED NOT DETECTED Final   Coronavirus OC43 NOT DETECTED NOT DETECTED Final   Metapneumovirus NOT DETECTED NOT DETECTED Final   Rhinovirus / Enterovirus NOT DETECTED NOT DETECTED Final   Influenza A NOT DETECTED NOT DETECTED Final   Influenza B NOT  DETECTED NOT DETECTED Final   Parainfluenza Virus 1 NOT DETECTED NOT DETECTED Final   Parainfluenza Virus 2 NOT DETECTED NOT DETECTED Final   Parainfluenza Virus 3 NOT DETECTED NOT DETECTED Final   Parainfluenza Virus 4 NOT DETECTED NOT DETECTED Final   Respiratory Syncytial Virus NOT DETECTED NOT DETECTED Final   Bordetella pertussis NOT DETECTED NOT DETECTED Final   Chlamydophila pneumoniae NOT DETECTED NOT DETECTED Final   Mycoplasma pneumoniae NOT DETECTED NOT DETECTED Final  C difficile quick screen w PCR reflex     Status: None   Collection Time: 02/11/17  1:55 AM  Result Value Ref Range Status   C Diff antigen NEGATIVE NEGATIVE Final   C Diff toxin NEGATIVE NEGATIVE Final   C Diff interpretation No C. difficile detected.  Final  Culture, blood (routine x 2) Call MD if unable to obtain prior to antibiotics being given     Status: None (Preliminary result)   Collection Time: 02/11/17  9:53 AM  Result Value Ref Range Status   Specimen Description BLOOD LEFT ANTECUBITAL  Final   Special Requests IN PEDIATRIC BOTTLE Blood Culture adequate volume  Final   Culture NO GROWTH 2 DAYS  Final   Report Status PENDING  Incomplete  MRSA PCR Screening     Status: None   Collection Time: 02/11/17  9:56 AM  Result Value Ref Range Status   MRSA by PCR  NEGATIVE NEGATIVE Final    Comment:        The GeneXpert MRSA Assay (FDA approved for NASAL specimens only), is one component of a comprehensive MRSA colonization surveillance program. It is not intended to diagnose MRSA infection nor to guide or monitor treatment for MRSA infections.   Culture, blood (routine x 2) Call MD if unable to obtain prior to antibiotics being given     Status: None (Preliminary result)   Collection Time: 02/11/17 10:00 AM  Result Value Ref Range Status   Specimen Description BLOOD LEFT ARM  Final   Special Requests IN PEDIATRIC BOTTLE Blood Culture adequate volume  Final   Culture NO GROWTH 2 DAYS  Final   Report Status PENDING  Incomplete  Culture, sputum-assessment     Status: None   Collection Time: 02/12/17  9:54 AM  Result Value Ref Range Status   Specimen Description SPUTUM  Final   Special Requests NONE  Final   Sputum evaluation THIS SPECIMEN IS ACCEPTABLE FOR SPUTUM CULTURE  Final   Report Status 02/12/2017 FINAL  Final  Culture, respiratory (NON-Expectorated)     Status: None (Preliminary result)   Collection Time: 02/12/17  9:54 AM  Result Value Ref Range Status   Specimen Description SPUTUM  Final   Special Requests NONE Reflexed from [id number]  Final   Gram Stain   Final    MODERATE WBC PRESENT, PREDOMINANTLY PMN RARE GRAM POSITIVE COCCI    Culture Consistent with normal respiratory flora.  Final   Report Status PENDING  Incomplete     Studies: No results found.  Scheduled Meds: . atorvastatin  80 mg Oral q1800  . dextromethorphan-guaiFENesin  1 tablet Oral BID  . mouth rinse  15 mL Mouth Rinse BID  . metoprolol tartrate  100 mg Oral BID  . nicotine  21 mg Transdermal Daily  . omega-3 acid ethyl esters  1 g Oral Daily  . pantoprazole  40 mg Oral BID    Continuous Infusions: . sodium chloride 10 mL (02/12/17 0209)  . tirofiban 0.075 mcg/kg/min (02/14/17 1115)  Time spent:  Leroy Pettway MD, PhD  [facility] Pager [phone]. If 7PM-7AM, please contact night-coverage at [email or web address], password [facility] 02/14/2017, 1:00 PM  LOS: 3 days

## 2017-02-14 NOTE — Progress Notes (Signed)
Pt was ask all day about a bath and she stated that she wanted later

## 2017-02-14 NOTE — Progress Notes (Signed)
Pt was ask about a bath and she stated that she wanted it later

## 2017-02-15 ENCOUNTER — Inpatient Hospital Stay (HOSPITAL_COMMUNITY): Payer: Medicare HMO | Admitting: Anesthesiology

## 2017-02-15 ENCOUNTER — Encounter (HOSPITAL_COMMUNITY)
Admission: EM | Disposition: A | Discharge status: Home / Self Care | Payer: Self-pay | Source: Home / Self Care | Attending: Internal Medicine

## 2017-02-15 ENCOUNTER — Encounter (HOSPITAL_COMMUNITY): Payer: Self-pay | Admitting: Emergency Medicine

## 2017-02-15 DIAGNOSIS — E785 Hyperlipidemia, unspecified: Secondary | ICD-10-CM

## 2017-02-15 DIAGNOSIS — K2971 Gastritis, unspecified, with bleeding: Secondary | ICD-10-CM

## 2017-02-15 HISTORY — PX: ESOPHAGOGASTRODUODENOSCOPY (EGD) WITH PROPOFOL: SHX5813

## 2017-02-15 LAB — BASIC METABOLIC PANEL
ANION GAP: 7 (ref 5–15)
BUN: 22 mg/dL — ABNORMAL HIGH (ref 6–20)
CALCIUM: 8 mg/dL — AB (ref 8.9–10.3)
CHLORIDE: 106 mmol/L (ref 101–111)
CO2: 26 mmol/L (ref 22–32)
CREATININE: 1.02 mg/dL — AB (ref 0.44–1.00)
GFR calc non Af Amer: 52 mL/min — ABNORMAL LOW (ref >60)
Glucose, Bld: 93 mg/dL (ref 65–99)
Potassium: 3.6 mmol/L (ref 3.5–5.1)
SODIUM: 139 mmol/L (ref 135–145)

## 2017-02-15 LAB — CBC
HCT: 38.5 % (ref 36.0–46.0)
HEMOGLOBIN: 12.4 g/dL (ref 12.0–15.0)
MCH: 26.2 pg (ref 26.0–34.0)
MCHC: 32.2 g/dL (ref 30.0–36.0)
MCV: 81.2 fL (ref 78.0–100.0)
PLATELETS: 220 10*3/uL (ref 150–400)
RBC: 4.74 MIL/uL (ref 3.87–5.11)
RDW: 16.7 % — ABNORMAL HIGH (ref 11.5–15.5)
WBC: 18.3 10*3/uL — AB (ref 4.0–10.5)

## 2017-02-15 LAB — PROTIME-INR
INR: 1.25
PROTHROMBIN TIME: 15.8 s — AB (ref 11.4–15.2)

## 2017-02-15 LAB — MAGNESIUM: MAGNESIUM: 1.4 mg/dL — AB (ref 1.7–2.4)

## 2017-02-15 SURGERY — ESOPHAGOGASTRODUODENOSCOPY (EGD) WITH PROPOFOL
Anesthesia: Monitor Anesthesia Care

## 2017-02-15 MED ORDER — POTASSIUM CHLORIDE CRYS ER 20 MEQ PO TBCR
40.0000 meq | EXTENDED_RELEASE_TABLET | Freq: Once | ORAL | Status: AC
  Administered 2017-02-15: 40 meq via ORAL
  Filled 2017-02-15: qty 2

## 2017-02-15 MED ORDER — ONDANSETRON HCL 4 MG/2ML IJ SOLN: 4.0000 mg | Freq: Once | INTRAMUSCULAR | Status: DC | PRN

## 2017-02-15 MED ORDER — LIDOCAINE HCL (CARDIAC) 20 MG/ML IV SOLN
INTRAVENOUS | Status: DC | PRN
  Administered 2017-02-15: 100 mg via INTRAVENOUS

## 2017-02-15 MED ORDER — SODIUM CHLORIDE 0.9 % IV SOLN
INTRAVENOUS | Status: DC
  Administered 2017-02-15: 13:00:00 via INTRAVENOUS

## 2017-02-15 MED ORDER — PROPOFOL 10 MG/ML IV BOLUS
INTRAVENOUS | Status: DC | PRN
  Administered 2017-02-15: 50 mg via INTRAVENOUS

## 2017-02-15 MED ORDER — PROPOFOL 500 MG/50ML IV EMUL
INTRAVENOUS | Status: DC | PRN
  Administered 2017-02-15: 150 ug/kg/min via INTRAVENOUS

## 2017-02-15 MED ORDER — MAGNESIUM SULFATE 2 GM/50ML IV SOLN
2.0000 g | Freq: Once | INTRAVENOUS | Status: AC
  Administered 2017-02-15: 2 g via INTRAVENOUS
  Filled 2017-02-15: qty 50

## 2017-02-15 MED ORDER — RIVAROXABAN 15 MG PO TABS
15.0000 mg | ORAL_TABLET | Freq: Every day | ORAL | Status: DC
  Administered 2017-02-15 – 2017-02-17 (×3): 15 mg via ORAL
  Filled 2017-02-15 (×4): qty 1

## 2017-02-15 MED ORDER — CLOPIDOGREL BISULFATE 75 MG PO TABS
75.0000 mg | ORAL_TABLET | Freq: Every day | ORAL | Status: DC
  Administered 2017-02-15 – 2017-02-18 (×4): 75 mg via ORAL
  Filled 2017-02-15 (×4): qty 1

## 2017-02-15 MED ORDER — FENTANYL CITRATE (PF) 100 MCG/2ML IJ SOLN: 25.0000 ug | INTRAMUSCULAR | Status: DC | PRN

## 2017-02-15 SURGICAL SUPPLY — 14 items

## 2017-02-15 NOTE — Op Note (Signed)
[facility] Patient Name: Christy Campbell Procedure Date : 02/15/2017 MRN: [id number] Attending MD: Bernette Redbird , MD Date of Birth: [birth year] CSN: [id number] Age: 76 Admit Type: Inpatient Procedure:                Upper GI endoscopy Indications:              Melena (recent) while on ASA and Xarelto, not on                            PPI at the time. Stable since coming off                            anticoagulants. Providers:                Bernette Redbird, MD, Dwain Sarna, RN, Margo Aye, Technician Referring MD:              Medicines:                Monitored Anesthesia Care Complications:            No immediate complications. Estimated Blood Loss:     Estimated blood loss: none. Procedure:                Pre-Anesthesia Assessment:                           - Prior to the procedure, a History and Physical                            was performed, and patient medications and                            allergies were reviewed. The patient's tolerance of                            previous anesthesia was also reviewed. The risks                            and benefits of the procedure and the sedation                            options and risks were discussed with the patient.                            All questions were answered, and informed consent                            was obtained. Prior Anticoagulants: The patient has                            taken Xarelto (rivaroxaban), last dose was 4 days  prior to procedure. ASA Grade Assessment: III - A                            patient with severe systemic disease. After                            reviewing the risks and benefits, the patient was                            deemed in satisfactory condition to undergo the                            procedure.                           After obtaining informed consent, the endoscope was           passed under direct vision. Throughout the                            procedure, the patient's blood pressure, pulse, and                            oxygen saturations were monitored continuously. The                            EG-[id number] ([id number]) scope was introduced through the                            mouth, and advanced to the second part of duodenum.                            The upper GI endoscopy was accomplished without                            difficulty. The patient tolerated the procedure                            fairly well (desaturation toward end, ?aspiration). Scope In: Scope Out: Findings:      The examined esophagus was normal.      A non-obstructing and mild Schatzki ring (acquired) was found at the       gastroesophageal junction.      A 4 cm hiatal hernia was present. The diaphragmatic hiatus was somewhat       patuluous on retroflexed viewing.      Patchy moderately erythematous mucosa, without erosive changes, and       without bleeding was found in the gastric antrum.      The exam of the stomach was otherwise normal.      There is no endoscopic evidence of ulceration, mass or erosion in the       entire examined stomach.      The cardia and gastric fundus were otherwise normal on retroflexion       apart from the patulous hiatus; no Cameron lesions were seen..      The examined duodenum was normal. Impression:               -  Normal esophagus.                           - Non-obstructing and mild Schatzki ring.                           - 4 cm hiatal hernia.                           - Erythematous mucosa in the antrum.                           - Normal examined duodenum.                           - No specimens collected.                           - No source of bleeding seen.                           - Transient desaturation, responded to bagging,                            with bilious fluid noted in hypopharynx upon scope                             withdrawl but no coughing. O2 sat in low 90's in                            recovery on nasal O2. Moderate Sedation:      This patient was sedated with monitored anesthesia care, not moderate       sedation. Recommendation:           - Continue present medications.                           - OK to resume aspirin today and Xarelto                            (rivaroxaban) today at prior doses. (Dr. Tenny Craw                            aware.)                           - Watch for fever spike in next 24 hrs for                            ??possible aspiration.                           - Resume regular diet today. Procedure Code(s):        --- Professional ---                           [phone], Esophagogastroduodenoscopy, flexible,  transoral; diagnostic, including collection of                            specimen(s) by brushing or washing, when performed                            (separate procedure) Diagnosis Code(s):        --- Professional ---                           K22.2, Esophageal obstruction                           K31.89, Other diseases of stomach and duodenum                           K92.1, Melena (includes Hematochezia) CPT copyright 2016 [employer]. All rights reserved. The codes documented in this report are preliminary and upon coder review may  be revised to meet current compliance requirements. Bernette Redbird, MD 02/15/2017 2:08:18 PM This report has been signed electronically. Number of Addenda: 0

## 2017-02-15 NOTE — Progress Notes (Signed)
Progress Note  Patient Name: Christy Campbell Date of Encounter: 02/15/2017  Primary Cardiologist: Allyson Sabal  Subjective   No chest pain, post EGD  Inpatient Medications    Scheduled Meds: . atorvastatin  80 mg Oral q1800  . dextromethorphan-guaiFENesin  1 tablet Oral BID  . mouth rinse  15 mL Mouth Rinse BID  . metoprolol tartrate  100 mg Oral BID  . nicotine  21 mg Transdermal Daily  . omega-3 acid ethyl esters  1 g Oral Daily  . pantoprazole  40 mg Oral BID   Continuous Infusions: . sodium chloride 10 mL (02/12/17 0209)  . tirofiban 0.075 mcg/kg/min (02/15/17 0133)   PRN Meds: sodium chloride, fentaNYL (SUBLIMAZE) injection, ipratropium, levalbuterol, ondansetron, ondansetron (ZOFRAN) IV   Vital Signs    Vitals:   02/15/17 1403 02/15/17 1405 02/15/17 1415 02/15/17 1425  BP: (!) 84/52 108/67 125/81 136/65  Pulse: (!) 112 (!) 39 93 93  Resp: 17 20 16  (!) 22  Temp: 98.2 F (36.8 C)     TempSrc: Oral     SpO2: 92% 94% 95% 95%  Weight:      Height:        Intake/Output Summary (Last 24 hours) at 02/15/17 1547 Last data filed at 02/15/17 1355  Gross per 24 hour  Intake             1109 ml  Output             3850 ml  Net            -2741 ml   Filed Weights   02/14/17 1147 02/15/17 0534 02/15/17 1241  Weight: 207 lb 9.6 oz (94.2 kg) 197 lb 12.8 oz (89.7 kg) 195 lb (88.5 kg)    Telemetry    Atrial fib 90 to 100- Personally Reviewed  ECG      Physical Exam   GEN: No acute distress.   Neck: No JVD Cardiac:  irreg irreg  , no murmurs, rubs, or gallops.  Respiratory: Clear to auscultation bilaterally. GI: Soft, nontender, non-distended  MS: 1+ edema; No deformity. Neuro:  Nonfocal  Psych: Normal affect   Labs    Chemistry  Recent Labs Lab 02/13/17 0438 02/14/17 0443 02/15/17 0646  NA 134* 136 139  K 3.7 4.3 3.6  CL 107 108 106  CO2 18* 20* 26  GLUCOSE 117* 110* 93  BUN 50* 35* 22*  CREATININE 1.74* 1.27* 1.02*  CALCIUM 7.4* 8.0* 8.0*    GFRNONAA 27* 40* 52*  GFRAA 32* 47* >60  ANIONGAP 9 8 7      Hematology  Recent Labs Lab 02/13/17 2007 02/14/17 0443 02/15/17 0646  WBC 19.2* 17.9* 18.3*  RBC 4.55 4.56 4.74  HGB 12.1 12.2 12.4  HCT 37.1 36.9 38.5  MCV 81.5 80.9 81.2  MCH 26.6 26.8 26.2  MCHC 32.6 33.1 32.2  RDW 16.6* 16.9* 16.7*  PLT 206 206 220    Cardiac Enzymes  Recent Labs Lab 02/11/17 1236 02/11/17 1550 02/11/17 1851 02/12/17 0049  TROPONINI 0.21* 0.26* 0.28* 0.26*     Recent Labs Lab 02/10/17 2121  TROPIPOC 0.09*     BNP  Recent Labs Lab 02/10/17 2030  BNP 2,783.2*     DDimer   Recent Labs Lab 02/10/17 2030  DDIMER 2.28*     Radiology    No results found.  Cardiac Studies     Patient Profile       Assessment & Plan    1  Atrial fib   Will increase b blocker for better rate control to 100  bid   On iv tirofiban, we will switch to Xarelto 15 mg po daily (per pharmacy [facility] trial)   2  CAD, REcent inferior STEMI  BMS to RCA on 01/30/17  Had been on tiple Rx  Last plavix 7/25  aond xarelto on 7/25, On iv tirofiban, we will switch to Xarelto 15 mg po daily (per pharmacy [facility] trial) and restart Plavix 75 mg po daily, hold aspirin  3  Systolic Diastolic CHF   lvef 30 to 35%  Volume is up more today  WIll give lasix 80 x 1   Follow response   4  GI bleed  GI is following  No source of bleeding was found today   5  REnal  Cr imporved at 1.0.2    6  Leukocytoisis    WBC increased but sl less  18.3  Signed, Tobias Alexander, MD  02/15/2017, 3:47 PM

## 2017-02-15 NOTE — Progress Notes (Signed)
Spoke to Endo RN, stated okay for pt to have medications with sips of water, pt scheduled for afternoon. Transportation report given    Christy Campbell  

## 2017-02-15 NOTE — Anesthesia Preprocedure Evaluation (Signed)
Anesthesia Evaluation  Patient identified by MRN, date of birth, ID band Patient awake    Reviewed: Allergy & Precautions, NPO status , Patient's Chart, lab work & pertinent test results  Airway Mallampati: II  TM Distance: >3 FB Neck ROM: Full    Dental  (+) Edentulous Upper, Dental Advisory Given   Pulmonary former smoker,    breath sounds clear to auscultation       Cardiovascular hypertension,  Rhythm:Irregular Rate:Normal     Neuro/Psych    GI/Hepatic   Endo/Other    Renal/GU      Musculoskeletal   Abdominal   Peds  Hematology   Anesthesia Other Findings   Reproductive/Obstetrics                             Anesthesia Physical Anesthesia Plan  ASA: III  Anesthesia Plan: MAC   Post-op Pain Management:    Induction:   PONV Risk Score and Plan: Ondansetron  Airway Management Planned: Natural Airway and Nasal Cannula  Additional Equipment:   Intra-op Plan:   Post-operative Plan:   Informed Consent: I have reviewed the patients History and Physical, chart, labs and discussed the procedure including the risks, benefits and alternatives for the proposed anesthesia with the patient or authorized representative who has indicated his/her understanding and acceptance.   Dental advisory given  Plan Discussed with: CRNA and Anesthesiologist  Anesthesia Plan Comments:         Anesthesia Quick Evaluation

## 2017-02-15 NOTE — Progress Notes (Signed)
Pt at EGD, high low bed set up in room

## 2017-02-15 NOTE — Anesthesia Procedure Notes (Signed)
Procedure Name: MAC Date/Time: 02/15/2017 1:36 PM Performed by: Izora Gala Pre-anesthesia Checklist: Patient identified, Emergency Drugs available and Suction available Patient Re-evaluated:Patient Re-evaluated prior to induction Oxygen Delivery Method: Nasal cannula Placement Confirmation: positive ETCO2

## 2017-02-15 NOTE — H&P (View-Only) (Signed)
Referring Provider:  Dr. Katalina Primary Care Physician:  Pc, [facility] Primary Gastroenterologist:  Unassigned  Reason for Consultation:  GI bleed  HPI: Kelli A Savoia is a 76 y.o. female with past medical history of STEMI earlier this month status post bare metal stent placement, history of atrial fibrillation on Xarelto  , history of peripheral vascular disease came into the ED with chief complaint of shortness of breath, cough along with nausea vomiting and diarrhea. Patient was also found to have dark stool as well as ??  coffee-ground emesis GI is consulted for further evaluation of GI bleed.   Patient seen and examined at bedside. Patient was complaining of black tarry stool for last few days. She will also complaining of epigastric abdominal pain as well as vomiting. She denied any vomiting of blood or any coffee-ground emesis. History of ulcer disease several years ago. Denied any NSAID use. Denied dysphagia or odynophagia. Patient is complaining of cough. Her shortness of breath is improving.  Family history of colon cancer in her mother in her 70s  Past Medical History:  Diagnosis Date  . Anemia    "long time ago"  . Arthritis    "back; right knee and ankle"  . Dyslipidemia   . GERD (gastroesophageal reflux disease)   . H/O cardiovascular stress test 12/06/08   normal, no ischemia  . History of stomach ulcers 03/01/12   "little bitty ones"  . Hypertension    echo 12/06/08-EF>55%, mild AS and mild pulmonary htn  . Kidney stones   . PVD (peripheral vascular disease) (HCC) 12/29/2011   h/o right common iliac artery stent, left SFA stent and left SFA directional atherectomy 02/29/12  . Tobacco abuse    Quit 01/30/2017    Past Surgical History:  Procedure Laterality Date  . APPENDECTOMY    . ATHERECTOMY  03/01/2012  . ATHERECTOMY N/A 12/29/2011   Procedure: ATHERECTOMY;  Surgeon: Jonathan J Berry, MD;  Location: [facility] CATH LAB;  Service: Cardiovascular;   Laterality: N/A;  . ATHERECTOMY N/A 03/01/2012   Procedure: ATHERECTOMY;  Surgeon: Jonathan J Berry, MD;  Location: [facility] CATH LAB;  Service: Cardiovascular;  Laterality: N/A;  . CARDIOVERSION N/A 08/10/2016   Procedure: CARDIOVERSION;  Surgeon: Mihai Croitoru, MD;  Location: [facility] ENDOSCOPY;  Service: Cardiovascular;  Laterality: N/A;  . CHOLECYSTECTOMY    . COLONOSCOPY    . CORONARY/GRAFT ACUTE MI REVASCULARIZATION N/A 01/30/2017   Procedure: Coronary/Graft Acute MI Revascularization;  Surgeon: Jordan, Peter M, MD;  Location: [facility] INVASIVE CV LAB;  Service: Cardiovascular;  Laterality: N/A;  . EYE SURGERY     Vitrectomy, and bilateral cataracts  . INGUINAL HERNIA REPAIR  1990's   right  . KNEE ARTHROSCOPY  1990's   bilaterally  . LASER PHOTO ABLATION Right 07/27/2013   Procedure: LASER PHOTO ABLATION;  Surgeon: Gary A Rankin, MD;  Location: [facility] OR;  Service: Ophthalmology;  Laterality: Right;  . LEFT HEART CATH AND CORONARY ANGIOGRAPHY N/A 01/30/2017   Procedure: Left Heart Cath and Coronary Angiography;  Surgeon: Jordan, Peter M, MD;  Location: [facility] INVASIVE CV LAB;  Service: Cardiovascular;  Laterality: N/A;  . PARS PLANA VITRECTOMY Right 07/27/2013   Procedure: PARS PLANA VITRECTOMY WITH 25 GAUGE;  Surgeon: Gary A Rankin, MD;  Location: [facility] OR;  Service: Ophthalmology;  Laterality: Right;  . PERIPHERAL ARTERIAL STENT GRAFT  03/01/2012   successful directional atheretomy using turbo hawk of a focal prox left SFA lesion   . PV angiogram  12/29/11     successful directinal atherectomy using the turbo hawk to righ SFA stenosis  . PV angiogram  04/17/10   successful PTA/stenting of the left superficial femoral artery and right iliac  . TUBAL LIGATION  1960's  . VAGINAL HYSTERECTOMY  1970's    Prior to Admission medications   Medication Sig Start Date End Date Taking? Authorizing Provider  acetaminophen (TYLENOL) 325 MG tablet Take 325 mg by mouth 2 (two) times daily as needed. For pain   Yes [provider]  aspirin EC 81 MG EC tablet Take 1 tablet (81 mg total) by mouth daily. 02/05/17  Yes Bhagat, Bhavinkumar, PA  atorvastatin (LIPITOR) 80 MG tablet Take 1 tablet (80 mg total) by mouth daily at 6 PM. 02/05/17  Yes Berry, Jonathan J, MD  clopidogrel (PLAVIX) 75 MG tablet Take 1 tablet (75 mg total) by mouth daily. 02/05/17  Yes Berry, Jonathan J, MD  HYDROcodone-homatropine (HYCODAN) 5-1.5 MG/5ML syrup Take 5 mLs by mouth every 6 (six) hours. 02/04/17  Yes Bhagat, Bhavinkumar, PA  lisinopril (PRINIVIL,ZESTRIL) 20 MG tablet Take 1 tablet (20 mg total) by mouth 2 (two) times daily. Patient taking differently: Take 40 mg by mouth 2 (two) times daily.  02/04/17  Yes Bhagat, Bhavinkumar, PA  lisinopril (PRINIVIL,ZESTRIL) 40 MG tablet Take 40 mg by mouth daily. 11/06/16  Yes [provider]  metoprolol tartrate (LOPRESSOR) 100 MG tablet Take 1 tablet (100 mg total) by mouth 2 (two) times daily. 02/04/17  Yes Bhagat, Bhavinkumar, PA  Multiple Vitamin (MULITIVITAMIN WITH MINERALS) TABS Take 1 tablet by mouth daily.   Yes [provider]  nitroGLYCERIN (NITROSTAT) 0.4 MG SL tablet Place 1 tablet (0.4 mg total) under the tongue every 5 (five) minutes x 3 doses as needed for chest pain. 02/05/17  Yes Berry, Jonathan J, MD  Omega-3 Fatty Acids (FISH OIL PEARLS) 300 MG CAPS Take 300 mg by mouth daily.   Yes [provider]  promethazine (PHENERGAN) 12.5 MG tablet Take 12.5 mg by mouth every 6 (six) hours as needed for nausea or vomiting.   Yes [provider]  rivaroxaban (XARELTO) 20 MG TABS tablet Take 1 tablet (20 mg total) by mouth daily with supper. 01/22/17  Yes Berry, Jonathan J, MD  vitamin C (ASCORBIC ACID) 500 MG tablet Take 1,000 mg by mouth daily.    Yes [provider]  rOPINIRole (REQUIP) 0.25 MG tablet Take 1 tablet (0.25 mg total) by mouth at bedtime. Patient not taking: Reported on 02/10/2017 02/05/17   Berry, Jonathan J, MD    Scheduled Meds: .  dextromethorphan-guaiFENesin  1 tablet Oral BID  . mouth rinse  15 mL Mouth Rinse BID  . metoprolol tartrate  50 mg Oral BID  . nicotine  21 mg Transdermal Daily  . omega-3 acid ethyl esters  1 g Oral Daily   Continuous Infusions: . sodium chloride 10 mL (02/12/17 0209)  . pantoprozole (PROTONIX) infusion 8 mg/hr (02/12/17 0208)  . sodium chloride     PRN Meds:.sodium chloride, ipratropium, levalbuterol, ondansetron  Allergies as of 02/10/2017 - Review Complete 02/10/2017  Allergen Reaction Noted  . Aspirin Nausea And Vomiting and Other (See Comments) 12/16/2011  . Tetanus toxoids Anaphylaxis and Swelling 12/16/2011  . Phenergan [promethazine hcl] Other (See Comments) 02/01/2017  . Hydrocodone Nausea Only 02/10/2017  . Oxycodone Nausea Only 02/10/2017  . Pravastatin  03/23/2013    Family History  Problem Relation Age of Onset  . CVA Mother   . Cancer Mother   .   Pneumonia Father     Social History   Social History  . Marital status: Married    Spouse name: N/A  . Number of children: N/A  . Years of education: N/A   Occupational History  . Retired    Social History Main Topics  . Smoking status: Former Smoker    Packs/day: 0.50    Years: 10.00    Types: Cigarettes    Quit date: 01/30/2017  . Smokeless tobacco: Never Used  . Alcohol use No  . Drug use: No  . Sexual activity: Yes   Other Topics Concern  . Not on file   Social History Narrative   Pt lives with husband in [place], [place]    Review of Systems: Review of Systems  Constitutional: Positive for malaise/fatigue. Negative for chills and fever.  HENT: Negative for hearing loss and tinnitus.   Eyes: Negative for blurred vision and double vision.  Respiratory: Positive for cough and shortness of breath.   Cardiovascular: Positive for palpitations. Negative for chest pain.  Gastrointestinal: Positive for abdominal pain, heartburn, melena, nausea and vomiting.  Skin: Negative for rash.  Neurological:  Positive for weakness.    Physical Exam: Vital signs: Vitals:   02/12/17 1100 02/12/17 1119  BP: 107/69   Pulse: 72   Resp: 20   Temp:  98.5 F (36.9 C)   Last BM Date: 02/11/17 General:   Alert,  Well-developed, well-nourished, pleasant and cooperative in NAD HEENT : Normocephalic, atraumatic, extraocular movement intact. Lungs:  Decreased breath sounds with some rhonchi. No respiratory distress at this time Heart:  Irregularly irregular heart rhythm, no murmur noted Abdomen: Epigastric tenderness to palpation, abdomen is soft, bowel sounds present, no peritoneal signs. LE: 1 + edema. Pulses present Psych : Mood and affect normal. Judgment normal. Alert oriented 3  Rectal:  Deferred  GI:  Lab Results:  Recent Labs  02/11/17 0948 02/11/17 1236 02/11/17 1851  WBC 20.9* 25.8* 28.5*  HGB 12.1 12.0 12.1  HCT 37.5 36.2 37.4  PLT 218 200 214   BMET  Recent Labs  02/10/17 2030 02/12/17 0049  NA 134* 135  K 4.7 4.7  CL 104 106  CO2 19* 18*  GLUCOSE 119* 125*  BUN 51* 63*  CREATININE 1.32* 2.25*  CALCIUM 8.6* 7.9*   LFT No results for input(s): PROT, ALBUMIN, AST, ALT, ALKPHOS, BILITOT, BILIDIR, IBILI in the last 72 hours. PT/INR  Recent Labs  02/11/17 0305  LABPROT 36.5*  INR 3.57     Studies/Results: Ct Abdomen Pelvis Wo Contrast  Result Date: 02/11/2017 CLINICAL DATA:  Nausea, vomiting, diarrhea, and diffuse abdominal pain since yesterday. Pt also became hypoxic while in ED. Pt had a STEMI last week with stent placement. Was d/c home on saturday. EXAM: CT CHEST, ABDOMEN AND PELVIS WITHOUT CONTRAST TECHNIQUE: Multidetector CT imaging of the chest, abdomen and pelvis was performed following the standard protocol without IV contrast. COMPARISON:  Multiple exams, including chest radiograph from 02/11/2017 and CT scan from 07/18/2014. FINDINGS: CT CHEST FINDINGS Cardiovascular: Coronary, aortic arch, and branch vessel atherosclerotic vascular disease. Moderate  cardiomegaly. Mediastinum/Nodes: Right lower paratracheal node at the level the carina measures 1.7 cm in short axis on image 21/4. Other smaller prevascular and paratracheal nodes are observed. Lungs/Pleura: Mild atelectasis in the right middle lobe. Indistinct ground-glass opacities in the superior segment right lower lobe and adjacent portion of the right upper lobe. Subsegmental atelectasis in the left upper lobe and left lower lobe. Bilateral small pleural effusions with   passive atelectasis. Loculated fluid in the left major fissure. Musculoskeletal: The patient was unable to raise her arms for imaging, resulting in streak artifact along the abdomen and pelvis which mildly reduces diagnostic sensitivity and specificity. Thoracic spondylosis. Minimal midthoracic wedging and slight superior endplate concavity at T12. CT ABDOMEN PELVIS FINDINGS Hepatobiliary: Cholecystectomy.  Otherwise unremarkable. Pancreas: Unremarkable Spleen: Unremarkable Adrenals/Urinary Tract: Adrenal glands normal. Suspected scarring in the left kidney upper pole. Abnormal left perirenal stranding/edema without hydronephrosis or appreciable nephrolithiasis. Mild likely chronic right perirenal stranding. Suspected cyst of the right kidney lower pole anteriorly. Borderline fullness of the right ureter without stones identified. Stomach/Bowel: Air fluid level in the rectum suspicious for diarrheal process. Most of the colon is nondistended. Vascular/Lymphatic: Aortoiliac atherosclerotic vascular disease. Reproductive: Uterus not well seen, query hysterectomy. Other: In addition to the left perirenal/retroperitoneal stranding, there is presacral edema which is new compared to 07/18/2014. Small but abnormal amount of free pelvic fluid. Low-level stranding along the pelvic sidewalls. Subcutaneous edema along the lower anterior abdominal walls laterally. Musculoskeletal: Chronic soft tissue defect along the right lateral abdominal wall  musculature posteriorly adjacent to the right kidney and in the subcostal region, not appreciably changed from 2015. Old healed fractures of the right pubic rami. Grade 1 anterolisthesis at L4-5 appears degenerative and is not appreciably changed from 2015. There is lumbar spondylosis and degenerative disc disease with probable central narrowing of the thecal sac at the L4-5 level. Degenerative endplate findings at L5-S1 with vacuum disc phenomenon. I do not see definite cortical discontinuity in the sacrum or a well-defined sacral fracture. IMPRESSION: 1. Third spacing of fluid, with small bilateral pleural effusions, small amount of ascites, flank edema, retroperitoneal edema (left greater than right), and presacral edema. I do not see hydronephrosis for a sacral fracture as a specific cause to suggest an underlying injury or obstruction as a cause for the edema. 2. Moderate cardiomegaly with evidence of coronary artery disease. Aortic Atherosclerosis (ICD10-I70.0). 3. In the major fissure, small portion of the left pleural effusion appears to be loculated. 4. Air-fluid level in the rectum suggestive of diarrheal process. Most of the colon is nondistended. Neither oral nor IV contrast was administered. 5. Lower lumbar spondylosis and degenerative disc disease, with potential impingement at L4-5. 6. There is an enlarged right lower paratracheal lymph node at 1.7 cm diameter. No prior cross-sectional imaging through this region. The appearance is abnormal but nonspecific and could be due to passive congestion from congestive heart failure, or otherwise occult malignancy. This may warrant surveillance for follow up. 7. Chronic defect in the right posterolateral abdominal wall musculature below the right twelfth rib, with absent visualization of the normal muscular tissues in this vicinity. Electronically Signed   By: Walter  Liebkemann M.D.   On: 02/11/2017 10:18   Dg Chest 2 View  Result Date:  02/10/2017 CLINICAL DATA:  Weakness nausea vomiting and dyspnea. Recent hospital discharge after coronary stent placement. EXAM: CHEST  2 VIEW COMPARISON:  01/30/2017 FINDINGS: Left posterior base opacity and effusion, new. Right lung is clear. Pulmonary vasculature is normal. Unchanged moderate cardiomegaly. IMPRESSION: New left base effusion and adjacent airspace opacity. This could be infectious or atelectatic. Unchanged cardiomegaly. Electronically Signed   By: Daniel R Mitchell M.D.   On: 02/10/2017 21:29   Ct Chest Wo Contrast  Result Date: 02/11/2017 CLINICAL DATA:  Nausea, vomiting, diarrhea, and diffuse abdominal pain since yesterday. Pt also became hypoxic while in ED. Pt had a STEMI last week with stent placement. Was   d/c home on saturday. EXAM: CT CHEST, ABDOMEN AND PELVIS WITHOUT CONTRAST TECHNIQUE: Multidetector CT imaging of the chest, abdomen and pelvis was performed following the standard protocol without IV contrast. COMPARISON:  Multiple exams, including chest radiograph from 02/11/2017 and CT scan from 07/18/2014. FINDINGS: CT CHEST FINDINGS Cardiovascular: Coronary, aortic arch, and branch vessel atherosclerotic vascular disease. Moderate cardiomegaly. Mediastinum/Nodes: Right lower paratracheal node at the level the carina measures 1.7 cm in short axis on image 21/4. Other smaller prevascular and paratracheal nodes are observed. Lungs/Pleura: Mild atelectasis in the right middle lobe. Indistinct ground-glass opacities in the superior segment right lower lobe and adjacent portion of the right upper lobe. Subsegmental atelectasis in the left upper lobe and left lower lobe. Bilateral small pleural effusions with passive atelectasis. Loculated fluid in the left major fissure. Musculoskeletal: The patient was unable to raise her arms for imaging, resulting in streak artifact along the abdomen and pelvis which mildly reduces diagnostic sensitivity and specificity. Thoracic spondylosis. Minimal  midthoracic wedging and slight superior endplate concavity at T12. CT ABDOMEN PELVIS FINDINGS Hepatobiliary: Cholecystectomy.  Otherwise unremarkable. Pancreas: Unremarkable Spleen: Unremarkable Adrenals/Urinary Tract: Adrenal glands normal. Suspected scarring in the left kidney upper pole. Abnormal left perirenal stranding/edema without hydronephrosis or appreciable nephrolithiasis. Mild likely chronic right perirenal stranding. Suspected cyst of the right kidney lower pole anteriorly. Borderline fullness of the right ureter without stones identified. Stomach/Bowel: Air fluid level in the rectum suspicious for diarrheal process. Most of the colon is nondistended. Vascular/Lymphatic: Aortoiliac atherosclerotic vascular disease. Reproductive: Uterus not well seen, query hysterectomy. Other: In addition to the left perirenal/retroperitoneal stranding, there is presacral edema which is new compared to 07/18/2014. Small but abnormal amount of free pelvic fluid. Low-level stranding along the pelvic sidewalls. Subcutaneous edema along the lower anterior abdominal walls laterally. Musculoskeletal: Chronic soft tissue defect along the right lateral abdominal wall musculature posteriorly adjacent to the right kidney and in the subcostal region, not appreciably changed from 2015. Old healed fractures of the right pubic rami. Grade 1 anterolisthesis at L4-5 appears degenerative and is not appreciably changed from 2015. There is lumbar spondylosis and degenerative disc disease with probable central narrowing of the thecal sac at the L4-5 level. Degenerative endplate findings at L5-S1 with vacuum disc phenomenon. I do not see definite cortical discontinuity in the sacrum or a well-defined sacral fracture. IMPRESSION: 1. Third spacing of fluid, with small bilateral pleural effusions, small amount of ascites, flank edema, retroperitoneal edema (left greater than right), and presacral edema. I do not see hydronephrosis for a sacral  fracture as a specific cause to suggest an underlying injury or obstruction as a cause for the edema. 2. Moderate cardiomegaly with evidence of coronary artery disease. Aortic Atherosclerosis (ICD10-I70.0). 3. In the major fissure, small portion of the left pleural effusion appears to be loculated. 4. Air-fluid level in the rectum suggestive of diarrheal process. Most of the colon is nondistended. Neither oral nor IV contrast was administered. 5. Lower lumbar spondylosis and degenerative disc disease, with potential impingement at L4-5. 6. There is an enlarged right lower paratracheal lymph node at 1.7 cm diameter. No prior cross-sectional imaging through this region. The appearance is abnormal but nonspecific and could be due to passive congestion from congestive heart failure, or otherwise occult malignancy. This may warrant surveillance for follow up. 7. Chronic defect in the right posterolateral abdominal wall musculature below the right twelfth rib, with absent visualization of the normal muscular tissues in this vicinity. Electronically Signed   By: Walter    Liebkemann M.D.   On: 02/11/2017 10:18   Dg Chest Portable 1 View  Result Date: 02/11/2017 CLINICAL DATA:  Shortness of breath. EXAM: PORTABLE CHEST 1 VIEW COMPARISON:  02/10/2017 FINDINGS: The patient is rotated to the left. The cardiomediastinal silhouette is unchanged. The lungs are mildly hypoinflated. A small left pleural effusion and patchy left basilar airspace opacity are unchanged. There is unchanged minimal right basilar atelectasis. No pleural effusion is seen. Thoracic spondylosis is noted. IMPRESSION: Unchanged small left pleural effusion and left basilar pneumonia or atelectasis. Electronically Signed   By: Allen  Grady M.D.   On: 02/11/2017 08:25    Impression/Plan: - Melena while on Plavix and Xarelto. Resolved now. Hemoglobin stable.  - H/O STEMI earlier this month. S/P bare metal stent placement - Acute hypoxic respiratory  failure. Improving. ?? HCAP  - History of A. fib. Anticoagulation on hold since 02/09/2017. Plavix  is also on hold during similar period of time - Family history of colon cancer in mother in her 70s  Recommendations --------------------------- - Patient with multiple medical issues. She is in need for anticoagulation as well as antiplatelet. - Her melena is resolved. Hemoglobin is stable. - Offered conservative management with restarting antiplatelet/ anticoagulation. Patient prefers inpatient workup with endoscopy. - Case discussed with cardiology Dr.Kelly. Okay to proceed with EGD from cardiac standpoint. - Recommend EGD once INR is less than 1.8. Repeat INR for tomorrow. - Okay to continue heart healthy diet for now - Dr. Buccini will follow up tomorrow.    LOS: 1 day   Donya Tomaro  MD, FACP 02/12/2017, 1:13 PM  Pager [phone] If no answer or after 5 PM call [phone] 

## 2017-02-15 NOTE — Progress Notes (Signed)
Ordered high low bed, pt reports history of falls

## 2017-02-15 NOTE — Care Management Important Message (Signed)
Important Message  Patient Details  Name: Christy Campbell MRN: [id number] Date of Birth: Feb 11, 1941   Medicare Important Message Given:  Yes    Lee-Anne Flicker 02/15/2017, 2:14 PM

## 2017-02-15 NOTE — Progress Notes (Signed)
Patient's endoscopy was negative for source of bleeding. Specifically, no aspirin induced erosions or ulcers were noted. There was no blood in the stomach. No prospective bleeding site was seen.  Incidental note was made of a minimal Schatzki's ring and a moderate-sized hiatal hernia.  Okay from GI standpoint to resume anticoagulation at any time. However, I would recommend that this patient be maintained on PPI prophylaxis indefinitely, if she is going to resume ulcerogenic medications such as aspirin.  Toward the end of her procedure, the patient had a significant drop in O2 sat duration, into the 50s, which responded to bagging. It was noted upon withdrawal of the scope that there was a small amount of bile in the hypopharynx. It is conceivable the patient could have had some aspiration, although she did not have any stridor or coughing. In recovery, she is breathing comfortably on nasal O2, with oxygen saturation in the mid 90s.  Chest is clear to auscultation except for a few crackles at right base.  Discussed case w/ Dr. Tenny Craw regarding anticoagulation and with Dr. Roda Shutters regarding ??aspiration.  We will sign off the patient's case, but please call us if further questions arise with respect to her care.  Florencia Reasons, M.D. Pager [phone] If no answer or after 5 PM call [phone]

## 2017-02-15 NOTE — Progress Notes (Signed)
PROGRESS NOTE  Darlis Wragg Knerr ZOX:[id number] DOB: 09/14/1940 DOA: 02/10/2017 PCP: Don Broach, [facility]  Patient was discharged from the hospital after treated for STEMI from 7/14-7/19, she was taken back to [place] ER on 7/25 evening due to sob, cough, and blood in the stool, she developed Hypotension sbp in 80's , respiratory distress RR in 40's , lethargy early am on 7/26 possible from chf exacerbation, gi bleed, ? Sepsis (with leukocytosis, lactic acidosis, though no fever),she was not stable to admit to stepdown, she may need pressors , critical care consulted, she was admitted to icu She received one dose of vanc/zosyn/zithro in the ER, she received lasix, she has improved, she is transferred out of icu and back on hospitalist service.   HPI/Recap of past 24 hours:  She is npo , awaiting egd, she is on aggrastat, She report less cough, still some lower extremity edema, denies chest pain or abdominal pain, no n/v, she has a bm last night was brown in color  no fever Daughter and husband at bedside   Assessment/Plan: Principal Problem:   Acute on chronic respiratory failure with hypoxia (HCC) Active Problems:   Dyslipidemia   Smoker   Atrial fibrillation with RVR (HCC)   Nausea vomiting and diarrhea   CAD (coronary artery disease)   HCAP (healthcare-associated pneumonia)   Sepsis (HCC)   Elevated troponin   Acute renal failure superimposed on stage 2 chronic kidney disease (HCC)   GIB (gastrointestinal bleeding)   Acute on chronic systolic CHF (congestive heart failure) (HCC)   Acute respiratory failure with hypoxia (HCC)   Ischemic cardiomyopathy    GI bleed, preseted with epigastric pain, FOBT +, hgb 12-11.3-12 Asa/plavix/xarelto held since admission on 7/25, she is on ppi drip, [facility] consulted, EGD on Monday Will follow gi recommendations  Acute on chronic systolic CHF exacerbation:  -lvef in 7/17 was 45-50% -Repeat echo on 7/26, lvef down to  30-35% -Ct chest  On 7/26 " Third spacing of fluid, with small bilateral pleural effusions, small amount of ascites, flank edema, retroperitoneal edema (left greater than right), and presacral edema." -She received lasix on 7/26, then on 7/29 -Cardiology is following, meds per cardiology  CAD with Recent h/o STEMI with three vessel disease, s/p stent on 01/30/2017, plan to have CABG after a few weeks DAPT, -patient denies chest pain, she had epigastric pain on admission, DAPT therapy held due to concerns of GI bleed -Cardiology consulted, she is started on aggrastat on 7/28   AFib/RVR:  -course complicated with hypotension, gi bleed initally -Currently rate controlled on metoprolol 100mg  bid (dose increased on 7/29) -Anticoagulation held, cardiology consulted. meds per cardiology  AKI on CKD III - in the setting of gi bleed , chf and possible sepsis,  -ua with many bacteremia, wbc 0-5, rbc 0-5, negative nitrite, negative leukocyte esterase, urine culture multiple organisms, non predominant  -cr peaked at 2.25, trending down, cr normalized on 7/30 -renal dosing meds, repeat bmp in am. Hold lisinopril  Leukocytosis: -procalcitonin peaked at 2.85 on 7/27 trending down, lactic acidosis peaked at 5.2 on 7/26 , trended down, wbc peaked at 28.5 on 7/26, trended down -No fever, blood culture no growth, urine culture multiple organisms, non predominant , sputum culture with normal respiratory flora -She received vanc/zosyn/zithr in the ED on 7/25, she has been observed off abx  Hypokalemia/hypomagnesemia: replace k/mag   FTT: will get PT eval, likely will need home health  Code Status: full  Family Communication:  patient , husband and daughter at bedside  Disposition Plan: possible discharge in 1-2 days with gi and cardiology clearance   Consultants:  Critical care  Cardiology  [facility]  Procedures:  bipap  egd on 7/30  Antibiotics:  Vanc/zosyn /zithro on  7/25     Objective: BP 122/63 (BP Location: Right Arm)   Pulse 86   Temp 98.4 F (36.9 C) (Oral)   Resp 19   Ht 5\' 6"  (1.676 m)   Wt 89.7 kg (197 lb 12.8 oz) Comment: c scale  SpO2 95%   BMI 31.93 kg/m   Intake/Output Summary (Last 24 hours) at 02/15/17 1243 Last data filed at 02/15/17 0900  Gross per 24 hour  Intake              949 ml  Output             6270 ml  Net            -5321 ml   Filed Weights   02/13/17 0500 02/14/17 1147 02/15/17 0534  Weight: 90.1 kg (198 lb 10.2 oz) 94.2 kg (207 lb 9.6 oz) 89.7 kg (197 lb 12.8 oz)    Exam:   General:  NAD  Cardiovascular: IRRR  Respiratory: improved aeration, scattered rales at basis, no wheezing, no rhonchi  Abdomen: Soft/ND/NT, positive BS  Musculoskeletal: legs are slightly tight , nonpitting  Neuro: aaox3  Data Reviewed: Basic Metabolic Panel:  Recent Labs Lab 02/10/17 2030 02/12/17 0049 02/13/17 0438 02/14/17 0443 02/15/17 0646  NA 134* 135 134* 136 139  K 4.7 4.7 3.7 4.3 3.6  CL 104 106 107 108 106  CO2 19* 18* 18* 20* 26  GLUCOSE 119* 125* 117* 110* 93  BUN 51* 63* 50* 35* 22*  CREATININE 1.32* 2.25* 1.74* 1.27* 1.02*  CALCIUM 8.6* 7.9* 7.4* 8.0* 8.0*  MG  --  1.9 1.8  --  1.4*  PHOS  --  6.3* 3.3  --   --    Liver Function Tests: No results for input(s): AST, ALT, ALKPHOS, BILITOT, PROT, ALBUMIN in the last 168 hours.  Recent Labs Lab 02/11/17 0124  LIPASE 21   No results for input(s): AMMONIA in the last 168 hours. CBC:  Recent Labs Lab 02/11/17 1851 02/13/17 0438 02/13/17 2007 02/14/17 0443 02/15/17 0646  WBC 28.5* 18.3* 19.2* 17.9* 18.3*  HGB 12.1 11.3* 12.1 12.2 12.4  HCT 37.4 34.1* 37.1 36.9 38.5  MCV 81.3 81.0 81.5 80.9 81.2  PLT 214 177 206 206 220   Cardiac Enzymes:    Recent Labs Lab 02/11/17 0948 02/11/17 1236 02/11/17 1550 02/11/17 1851 02/12/17 0049  TROPONINI 0.20* 0.21* 0.26* 0.28* 0.26*   BNP (last 3 results)  Recent Labs  02/10/17 2030  BNP  2,783.2*    ProBNP (last 3 results) No results for input(s): PROBNP in the last 8760 hours.  CBG:  Recent Labs Lab 02/10/17 2236  GLUCAP 107*    Recent Results (from the past 240 hour(s))  Culture, Urine     Status: Abnormal   Collection Time: 02/10/17 10:57 PM  Result Value Ref Range Status   Specimen Description URINE, RANDOM  Final   Special Requests NONE  Final   Culture MULTIPLE ORGANISMS PRESENT, NONE PREDOMINANT (A)  Final   Report Status 02/12/2017 FINAL  Final  Respiratory Panel by PCR     Status: None   Collection Time: 02/11/17  1:54 AM  Result Value Ref Range Status   Adenovirus NOT DETECTED  NOT DETECTED Final   Coronavirus 229E NOT DETECTED NOT DETECTED Final   Coronavirus HKU1 NOT DETECTED NOT DETECTED Final   Coronavirus NL63 NOT DETECTED NOT DETECTED Final   Coronavirus OC43 NOT DETECTED NOT DETECTED Final   Metapneumovirus NOT DETECTED NOT DETECTED Final   Rhinovirus / Enterovirus NOT DETECTED NOT DETECTED Final   Influenza A NOT DETECTED NOT DETECTED Final   Influenza B NOT DETECTED NOT DETECTED Final   Parainfluenza Virus 1 NOT DETECTED NOT DETECTED Final   Parainfluenza Virus 2 NOT DETECTED NOT DETECTED Final   Parainfluenza Virus 3 NOT DETECTED NOT DETECTED Final   Parainfluenza Virus 4 NOT DETECTED NOT DETECTED Final   Respiratory Syncytial Virus NOT DETECTED NOT DETECTED Final   Bordetella pertussis NOT DETECTED NOT DETECTED Final   Chlamydophila pneumoniae NOT DETECTED NOT DETECTED Final   Mycoplasma pneumoniae NOT DETECTED NOT DETECTED Final  C difficile quick screen w PCR reflex     Status: None   Collection Time: 02/11/17  1:55 AM  Result Value Ref Range Status   C Diff antigen NEGATIVE NEGATIVE Final   C Diff toxin NEGATIVE NEGATIVE Final   C Diff interpretation No C. difficile detected.  Final  Culture, blood (routine x 2) Call MD if unable to obtain prior to antibiotics being given     Status: None (Preliminary result)   Collection Time:  02/11/17  9:53 AM  Result Value Ref Range Status   Specimen Description BLOOD LEFT ANTECUBITAL  Final   Special Requests IN PEDIATRIC BOTTLE Blood Culture adequate volume  Final   Culture NO GROWTH 3 DAYS  Final   Report Status PENDING  Incomplete  MRSA PCR Screening     Status: None   Collection Time: 02/11/17  9:56 AM  Result Value Ref Range Status   MRSA by PCR NEGATIVE NEGATIVE Final    Comment:        The GeneXpert MRSA Assay (FDA approved for NASAL specimens only), is one component of a comprehensive MRSA colonization surveillance program. It is not intended to diagnose MRSA infection nor to guide or monitor treatment for MRSA infections.   Culture, blood (routine x 2) Call MD if unable to obtain prior to antibiotics being given     Status: None (Preliminary result)   Collection Time: 02/11/17 10:00 AM  Result Value Ref Range Status   Specimen Description BLOOD LEFT ARM  Final   Special Requests IN PEDIATRIC BOTTLE Blood Culture adequate volume  Final   Culture NO GROWTH 3 DAYS  Final   Report Status PENDING  Incomplete  Culture, sputum-assessment     Status: None   Collection Time: 02/12/17  9:54 AM  Result Value Ref Range Status   Specimen Description SPUTUM  Final   Special Requests NONE  Final   Sputum evaluation THIS SPECIMEN IS ACCEPTABLE FOR SPUTUM CULTURE  Final   Report Status 02/12/2017 FINAL  Final  Culture, respiratory (NON-Expectorated)     Status: None   Collection Time: 02/12/17  9:54 AM  Result Value Ref Range Status   Specimen Description SPUTUM  Final   Special Requests NONE Reflexed from [id number]  Final   Gram Stain   Final    MODERATE WBC PRESENT, PREDOMINANTLY PMN RARE GRAM POSITIVE COCCI    Culture Consistent with normal respiratory flora.  Final   Report Status 02/14/2017 FINAL  Final     Studies: No results found.  Scheduled Meds: . [MAR Hold] atorvastatin  80 mg Oral q1800  . [  MAR Hold] dextromethorphan-guaiFENesin  1 tablet Oral BID   . [MAR Hold] mouth rinse  15 mL Mouth Rinse BID  . [MAR Hold] metoprolol tartrate  100 mg Oral BID  . [MAR Hold] nicotine  21 mg Transdermal Daily  . [MAR Hold] omega-3 acid ethyl esters  1 g Oral Daily  . [MAR Hold] pantoprazole  40 mg Oral BID    Continuous Infusions: . [MAR Hold] sodium chloride 10 mL (02/12/17 0209)  . sodium chloride    . tirofiban 0.075 mcg/kg/min (02/15/17 0133)     Time spent:  Diante Barley MD, PhD  [facility] Pager [phone]. If 7PM-7AM, please contact night-coverage at [email or web address], password [facility] 02/15/2017, 12:43 PM  LOS: 4 days

## 2017-02-15 NOTE — Anesthesia Postprocedure Evaluation (Signed)
Anesthesia Post Note  Patient: Christy Campbell  Procedure(s) Performed: Procedure(s) (LRB): ESOPHAGOGASTRODUODENOSCOPY (EGD) WITH PROPOFOL (N/A)     Patient location during evaluation: Endoscopy Anesthesia Type: MAC Level of consciousness: awake, awake and alert and oriented Pain management: pain level controlled Vital Signs Assessment: post-procedure vital signs reviewed and stable Respiratory status: spontaneous breathing, nonlabored ventilation and respiratory function stable Cardiovascular status: blood pressure returned to baseline Anesthetic complications: no    Last Vitals:  Vitals:   02/15/17 1415 02/15/17 1425  BP: 125/81 136/65  Pulse: 93 93  Resp: 16 (!) 22  Temp:      Last Pain:  Vitals:   02/15/17 1403  TempSrc: Oral  PainSc:                  Kester Stimpson COKER

## 2017-02-15 NOTE — Transfer of Care (Signed)
Immediate Anesthesia Transfer of Care Note  Patient: Glendia A Cutting  Procedure(s) Performed: Procedure(s) with comments: ESOPHAGOGASTRODUODENOSCOPY (EGD) WITH PROPOFOL (N/A) - Recent MI  Patient Location: PACU  Anesthesia Type:MAC  Level of Consciousness: awake, alert , oriented and patient cooperative  Airway & Oxygen Therapy: Patient Spontanous Breathing and Patient connected to nasal cannula oxygen  Post-op Assessment: Report given to RN, Post -op Vital signs reviewed and stable and Patient moving all extremities  Post vital signs: Reviewed and stable  Last Vitals:  Vitals:   02/15/17 1403 02/15/17 1405  BP: (!) 84/52 108/67  Pulse: (!) 112 (!) 39  Resp: 17 20  Temp: 36.8 C     Last Pain:  Vitals:   02/15/17 1403  TempSrc: Oral  PainSc:          Complications: No apparent anesthesia complications

## 2017-02-15 NOTE — Interval H&P Note (Signed)
History and Physical Interval Note:  02/15/2017 1:47 PM  Christy Campbell  has presented today for surgery, with the diagnosis of Melena  The various methods of treatment have been discussed with the patient and family. After consideration of risks, benefits and other options for treatment, the patient has consented to  Procedure(s) with comments: ESOPHAGOGASTRODUODENOSCOPY (EGD) WITH PROPOFOL (N/A) - Recent MI as a surgical intervention .  The patient's history has been reviewed, patient examined, no change in status, stable for surgery.  I have reviewed the patient's chart and labs.  Questions were answered to the patient's satisfaction.     Florencia Reasons

## 2017-02-16 ENCOUNTER — Encounter (HOSPITAL_COMMUNITY): Payer: Self-pay | Admitting: Gastroenterology

## 2017-02-16 DIAGNOSIS — I10 Essential (primary) hypertension: Secondary | ICD-10-CM

## 2017-02-16 LAB — CULTURE, BLOOD (ROUTINE X 2)
CULTURE: NO GROWTH
CULTURE: NO GROWTH
SPECIAL REQUESTS: ADEQUATE
Special Requests: ADEQUATE

## 2017-02-16 LAB — MAGNESIUM: Magnesium: 1.7 mg/dL (ref 1.7–2.4)

## 2017-02-16 LAB — BASIC METABOLIC PANEL
Anion gap: 8 (ref 5–15)
BUN: 23 mg/dL — AB (ref 6–20)
CHLORIDE: 105 mmol/L (ref 101–111)
CO2: 24 mmol/L (ref 22–32)
CREATININE: 0.89 mg/dL (ref 0.44–1.00)
Calcium: 8.1 mg/dL — ABNORMAL LOW (ref 8.9–10.3)
GFR calc Af Amer: >60 mL/min (ref >60)
GFR calc non Af Amer: >60 mL/min (ref >60)
Glucose, Bld: 113 mg/dL — ABNORMAL HIGH (ref 65–99)
POTASSIUM: 4 mmol/L (ref 3.5–5.1)
SODIUM: 137 mmol/L (ref 135–145)

## 2017-02-16 LAB — CBC
HEMATOCRIT: 37.8 % (ref 36.0–46.0)
Hemoglobin: 12.3 g/dL (ref 12.0–15.0)
MCH: 26.7 pg (ref 26.0–34.0)
MCHC: 32.5 g/dL (ref 30.0–36.0)
MCV: 82 fL (ref 78.0–100.0)
PLATELETS: 245 10*3/uL (ref 150–400)
RBC: 4.61 MIL/uL (ref 3.87–5.11)
RDW: 17 % — AB (ref 11.5–15.5)
WBC: 18.3 10*3/uL — AB (ref 4.0–10.5)

## 2017-02-16 MED ORDER — DILTIAZEM HCL ER COATED BEADS 120 MG PO CP24
120.0000 mg | ORAL_CAPSULE | Freq: Every day | ORAL | Status: DC
  Administered 2017-02-16 – 2017-02-17 (×2): 120 mg via ORAL
  Filled 2017-02-16 (×4): qty 1

## 2017-02-16 MED ORDER — FUROSEMIDE 10 MG/ML IJ SOLN
40.0000 mg | Freq: Two times a day (BID) | INTRAMUSCULAR | Status: DC
  Administered 2017-02-16 – 2017-02-18 (×4): 40 mg via INTRAVENOUS
  Filled 2017-02-16 (×4): qty 4

## 2017-02-16 MED ORDER — ROPINIROLE HCL 0.25 MG PO TABS
0.2500 mg | ORAL_TABLET | Freq: Once | ORAL | Status: AC
  Administered 2017-02-16: 0.25 mg via ORAL
  Filled 2017-02-16: qty 1

## 2017-02-16 NOTE — Evaluation (Signed)
Physical Therapy Evaluation Patient Details Name: Christy Campbell MRN: [id number] DOB: Jul 20, 1941 Today's Date: 02/16/2017   History of Present Illness  Pt is a 76 yo female presenting with complaints of cough and dyspnea present since discharge on 7/19 that have increasingly worsened. Reports productive cough with white sputum production, as well as nausea, vomiting, and diarrhea with mid epigastric pain.PMH of Anemia, Dyslipidemia, GERD, Gastric Ulcers, HTN, PVD, A.Fib on Xarelto, Systolic HF, CKD stage 2, Recent Admission 7/14-7/19 with STEMI with stenting of mid RCA with DES.  Clinical Impression  Pt admitted with above diagnosis. Pt currently with functional limitations due to the deficits listed below (see PT Problem List). Pt is currently, mod I for bed mobility, and min guard for transfers and ambulation of 60 feet with RW. Pt limited by shortness of breath with activity. Pt will benefit from skilled PT to increase their independence and safety with mobility to allow discharge to the venue listed below.       Follow Up Recommendations No PT follow up;Supervision/Assistance - 24 hour    Equipment Recommendations  3in1 (PT)       Precautions / Restrictions Precautions Precautions: Fall Restrictions Weight Bearing Restrictions: No      Mobility  Bed Mobility Overal bed mobility: Modified Independent                Transfers Overall transfer level: Needs assistance Equipment used: Rolling walker (2 wheeled) Transfers: Sit to/from Stand Sit to Stand: Min guard         General transfer comment: min guard for safety, vc for hand placement for power up   Ambulation/Gait Ambulation/Gait assistance: Min guard Ambulation Distance (Feet): 60 Feet Assistive device: Rolling walker (2 wheeled) Gait Pattern/deviations: Step-through pattern;Decreased stride length;Trunk flexed Gait velocity: slowed Gait velocity interpretation: Below normal speed for age/gender General  Gait Details: min guard for safety, vc for upright posture, and stayin within walker, pt required standing rest break at 30 feet secondary to 3/4 DoE. No buckling, or LoB.      Balance Overall balance assessment: Needs assistance Sitting-balance support: No upper extremity supported;Feet supported Sitting balance-Leahy Scale: Good Sitting balance - Comments: able to reach outside BoS   Standing balance support: Bilateral upper extremity supported Standing balance-Leahy Scale: Fair Standing balance comment: requires RW assist                             Pertinent Vitals/Pain Pain Assessment: No/denies pain    Home Living Family/patient expects to be discharged to:: Private residence Living Arrangements: Spouse/significant other Available Help at Discharge: Available 24 hours/day;Family Type of Home: House Home Access: Stairs to enter Entrance Stairs-Rails: None Entrance Stairs-Number of Steps: 5 Home Layout: One level Home Equipment: Environmental consultant - 2 wheels      Prior Function Level of Independence: Independent         Comments: community and driver        Extremity/Trunk Assessment   Upper Extremity Assessment Upper Extremity Assessment: Generalized weakness    Lower Extremity Assessment Lower Extremity Assessment: Generalized weakness    Cervical / Trunk Assessment Cervical / Trunk Assessment: Kyphotic  Communication   Communication: No difficulties  Cognition Arousal/Alertness: Awake/alert Behavior During Therapy: WFL for tasks assessed/performed Overall Cognitive Status: Within Functional Limits for tasks assessed  General Comments General comments (skin integrity, edema, etc.): Husband present throughout session, at reset HR 120 bpm, with ambulation HR incrreased to 152 bpm        Assessment/Plan    PT Assessment Patient needs continued PT services  PT Problem List Decreased  strength;Decreased activity tolerance;Decreased mobility;Cardiopulmonary status limiting activity       PT Treatment Interventions DME instruction;Gait training;Stair training;Functional mobility training;Therapeutic activities;Balance training;Therapeutic exercise;Patient/family education    PT Goals (Current goals can be found in the Care Plan section)  Acute Rehab PT Goals Patient Stated Goal: go home PT Goal Formulation: With patient Time For Goal Achievement: 02/23/17 Potential to Achieve Goals: Good    Frequency Min 3X/week    AM-PAC PT "6 Clicks" Daily Activity  Outcome Measure Difficulty turning over in bed (including adjusting bedclothes, sheets and blankets)?: A Little Difficulty moving from lying on back to sitting on the side of the bed? : A Little Difficulty sitting down on and standing up from a chair with arms (e.g., wheelchair, bedside commode, etc,.)?: A Little Help needed moving to and from a bed to chair (including a wheelchair)?: None Help needed walking in hospital room?: None Help needed climbing 3-5 steps with a railing? : A Lot 6 Click Score: 19    End of Session Equipment Utilized During Treatment: Gait belt Activity Tolerance: Patient limited by fatigue Patient left: in bed;with call bell/phone within reach;with bed alarm set;with family/visitor present Nurse Communication: Mobility status PT Visit Diagnosis: Other abnormalities of gait and mobility (R26.89);Muscle weakness (generalized) (M62.81);Difficulty in walking, not elsewhere classified (R26.2)    Time: 1610-9604 PT Time Calculation (min) (ACUTE ONLY): 30 min   Charges:   PT Evaluation $PT Eval Moderate Complexity: 1 Mod PT Treatments $Gait Training: 8-22 mins   PT G Codes:        Christy Campbell PT, DPT Acute Rehabilitation  [phone] Pager [phone]    Christy Campbell 02/16/2017, 9:17 AM

## 2017-02-16 NOTE — Progress Notes (Signed)
Paged MD regarding pt elevated BP 148/113, rechecked and 158/113. No PRN beta blockers Awaiting call back

## 2017-02-16 NOTE — Care Management Note (Signed)
Case Management Note  Patient Details  Name: Christy Campbell MRN: [id number] Date of Birth: [birth year]  Subjective/Objective:   Acute on Chronic Resp Failure               Action/Plan: Patient lives at home with spouse, continues to drive; Primary Care Provider: Pc, [facility]; received consult that patient wanted to change her PCP; patient stated that she has been seeing her PCP for years and she is ok with having her for primary care but her husband wants her to change; CM talked to spouse about other PCP's in the area, he is to call them to see which one he wants his wife to see; has private insurance with [employer] with prescription drug coverage; pharmacy of choice is [facility] in [place]; No DME at home; CM will continue to follow for DCP;  Expected Discharge Date:   possibly 02/16/2017               Expected Discharge Plan:  Home/Self Care  Discharge planning Services  CM Consult  HH Arranged:  Patient Refused [facility]   Status of Service:  In process, will continue to follow  Reola Mosher [phone] 02/16/2017, 12:02 PM

## 2017-02-16 NOTE — Progress Notes (Addendum)
Progress Note  Patient Name: Christy Campbell Date of Encounter: 02/16/2017  Primary Cardiologist: Allyson Sabal  Subjective   Feeling short of breath this morning, but overall feeling better then when she got to the hospital  Blood pressure (!) 144/95, pulse 92, temperature 98.9 F (37.2 C), temperature source Oral, resp. rate 20, height 5\' 6"  (1.676 m), weight 200 lb 6.4 oz (90.9 kg), SpO2 100 %.   Inpatient Medications    Scheduled Meds: . atorvastatin  80 mg Oral q1800  . clopidogrel  75 mg Oral Daily  . dextromethorphan-guaiFENesin  1 tablet Oral BID  . mouth rinse  15 mL Mouth Rinse BID  . metoprolol tartrate  100 mg Oral BID  . nicotine  21 mg Transdermal Daily  . omega-3 acid ethyl esters  1 g Oral Daily  . pantoprazole  40 mg Oral BID  . rivaroxaban  15 mg Oral Q supper   Continuous Infusions: . sodium chloride 10 mL (02/12/17 0209)   PRN Meds: sodium chloride, fentaNYL (SUBLIMAZE) injection, ipratropium, levalbuterol, ondansetron   Vital Signs    Vitals:   02/15/17 1415 02/15/17 1425 02/15/17 2000 02/16/17 0543  BP: 125/81 136/65 (!) 137/91 (!) 144/95  Pulse: 93 93 (!) 102 92  Resp: 16 (!) 22 20 20   Temp:   98.6 F (37 C) 98.9 F (37.2 C)  TempSrc:   Oral Oral  SpO2: 95% 95% 100% 100%  Weight:    200 lb 6.4 oz (90.9 kg)  Height:        Intake/Output Summary (Last 24 hours) at 02/16/17 1052 Last data filed at 02/16/17 1051  Gross per 24 hour  Intake              990 ml  Output             1950 ml  Net             -960 ml   Filed Weights   02/15/17 0534 02/15/17 1241 02/16/17 0543  Weight: 197 lb 12.8 oz (89.7 kg) 195 lb (88.5 kg) 200 lb 6.4 oz (90.9 kg)    Telemetry    Atrial fibrillation, variable rates  - Personally Reviewed   Physical Exam   GEN: Well nourished, well developed HEENT: normal  Neck: no JVD, carotid bruits, or masses Cardiac: + atrial fibrillation. no murmurs, rubs, or gallops,no edema. Intact distal pulses bilaterally.    Respiratory: clear to auscultation bilaterally, normal work of breathing, becomes mildly SOB with long sentences.  GI: soft, nontender, nondistended, + BS MS: no deformity or atrophy  Skin: warm and dry, no rash Neuro: Alert and Oriented x 3, Strength and sensation are intact Psych:   Full affect  Labs    Chemistry Recent Labs Lab 02/14/17 0443 02/15/17 0646 02/16/17 0347  NA 136 139 137  K 4.3 3.6 4.0  CL 108 106 105  CO2 20* 26 24  GLUCOSE 110* 93 113*  BUN 35* 22* 23*  CREATININE 1.27* 1.02* 0.89  CALCIUM 8.0* 8.0* 8.1*  GFRNONAA 40* 52* >60  GFRAA 47* >60 >60  ANIONGAP 8 7 8      Hematology Recent Labs Lab 02/14/17 0443 02/15/17 0646 02/16/17 0347  WBC 17.9* 18.3* 18.3*  RBC 4.56 4.74 4.61  HGB 12.2 12.4 12.3  HCT 36.9 38.5 37.8  MCV 80.9 81.2 82.0  MCH 26.8 26.2 26.7  MCHC 33.1 32.2 32.5  RDW 16.9* 16.7* 17.0*  PLT 206 220 245    Cardiac Enzymes  Recent Labs Lab 02/11/17 1236 02/11/17 1550 02/11/17 1851 02/12/17 0049  TROPONINI 0.21* 0.26* 0.28* 0.26*    Recent Labs Lab 02/10/17 2121  TROPIPOC 0.09*     BNP Recent Labs Lab 02/10/17 2030  BNP 2,783.2*     DDimer  Recent Labs Lab 02/10/17 2030  DDIMER 2.28*     Radiology    No results found.  Cardiac Studies   02/02/2017 Echocardiogram Study Conclusions  - Left ventricle: The cavity size was normal. Wall thickness was   increased in a pattern of mild LVH. Systolic function was mildly   reduced. The estimated ejection fraction was in the range of 45%   to 50%. There is hypokinesis of the basal-midinferior myocardium. - Aortic valve: Trileaflet; mildly thickened, mildly calcified   leaflets. - Mitral valve: There was moderate regurgitation. - Left atrium: The atrium was mildly dilated. - Right atrium: The atrium was mildly dilated. - Tricuspid valve: There was moderate regurgitation directed   eccentrically and toward the septum. - Pulmonary arteries: Systolic pressure  was moderately increased.   PA peak pressure: 52 mm Hg (S).  02/12/2017 Limited Echocardiogram Study Conclusions  - Left ventricle: The cavity size was normal. There was mild   concentric hypertrophy. Systolic function was moderately to   severely reduced. The estimated ejection fraction was in the   range of 30% to 35%. Moderate diffuse hypokinesis with distinct   regional wall motion abnormalities. Severe hypokinesis of the   basal-midinferolateral, inferior, and inferoseptal myocardium. - Mitral valve: There was mild to moderate regurgitation directed   centrally. - Left atrium: The atrium was moderately to severely dilated. - Right ventricle: The cavity size was mildly dilated. Systolic   function was moderately reduced. - Right atrium: The atrium was mildly dilated. - Tricuspid valve: There was moderate-severe regurgitation directed   centrally. - Pulmonary arteries: Systolic pressure was moderately increased.   PA peak pressure: 55 mm Hg (S).  Impressions:  - Image quality has deteriorated compared to February 02, 2017 study.   Nevertheless, overall LV systolic function appears a little worse   and there is more evident RV dysfunction.   Findings are consistent with extensive ischemia/nfarction in the   right coronary artery distribution, with RV involvement.    Patient Profile     Christy Campbell is a 76 y.o. female with a history of PVD with s/p multiple PTA procedures with right common iliac artery stent, left SFA stent, left SFA directional atherectomy 2013, abnormal nuclear stress test 07/2014 with mild anteroapical ischemia with no angina and persistent atrial fibrillation s/p DCCV 07/2016 on Xarelto with last dose last night  Assessment & Plan   1. Atrial fibrillation: BB increased for improved rate control to 100 bid yesterday. Heart rates are improved today. On iv tirofiban, we will switch to Xarelto 15 mg po daily (per pharmacy PIONEER trial)  -- feeling short  of breath today resting in bed. Potentially smptomatic from afib?   2. CAD, recent inferior STEMI BMS to RCA on 01/30/17:  Holding aspirin, continue  plavix and Xarelto (7/25)  2. Systolic diastolic CHF: LVEF 30-35%, given a dose of lasix 80 x 1 yesterday. Has a net loss of 3.5 liters this admission.  4. GI bleed: GI managing  5. Renal insufficiency: Creatinine is 0.89, which is improved from previous values of 1.27 and 1.02, continue to diurese. Recommend one more dose of IV lasix 80 mg x 1 dose again today and re-eval.  Signed, Dorthula Matas,  PA-C  02/16/2017, 10:52 AM    The patient was seen, examined and discussed with Marlon Pel, PA-C and I agree with the above.   Xarelto and plavix restarted the last night after negative EGD, no bleeding, she still feels SOB, I will add lasix 40 mg iv BID (none prior to admission), neg 900 cc after one dose of iv lasix 80 mg.  She is still tachycardic, and hypertensive, I will add cardizem CD 120 mg po daily.  Tobias Alexander, MD 02/16/2017

## 2017-02-16 NOTE — Progress Notes (Signed)
MD Xu aware, awaiting for cardiology to return page

## 2017-02-16 NOTE — Progress Notes (Signed)
PROGRESS NOTE  Christy Campbell ZOX:[id number] DOB: 04/14/1941 DOA: 02/10/2017 PCP: Don Broach, [facility]  Patient was discharged from the hospital after treated for STEMI from 7/14-7/19, she was taken back to [place] ER on 7/25 evening due to sob, cough, and blood in the stool, she developed Hypotension sbp in 80's , respiratory distress RR in 40's , lethargy early am on 7/26 possible from chf exacerbation, gi bleed, ? Sepsis (with leukocytosis, lactic acidosis, though no fever),she was not stable to admit to stepdown, she may need pressors , critical care consulted, she was admitted to icu She received one dose of vanc/zosyn/zithro in the ER, she received lasix, she has improved, she is transferred out of icu and back on hospitalist service.  Cardiology continue adjusting meds, hopefully able to discharge in 1-2 days with cardiology clearance.   HPI/Recap of past 24 hours:  Overall continue to improve, she started to work with physical therapy today, she felt weak when she walked   no fever, edema is improving  husband at bedside   Assessment/Plan: Principal Problem:   Acute on chronic respiratory failure with hypoxia (HCC) Active Problems:   Dyslipidemia   Smoker   Atrial fibrillation with RVR (HCC)   Nausea vomiting and diarrhea   CAD (coronary artery disease)   HCAP (healthcare-associated pneumonia)   Sepsis (HCC)   Elevated troponin   Acute renal failure superimposed on stage 2 chronic kidney disease (HCC)   GIB (gastrointestinal bleeding)   Acute on chronic systolic CHF (congestive heart failure) (HCC)   Acute respiratory failure with hypoxia (HCC)   Ischemic cardiomyopathy    GI bleed, preseted with epigastric pain, FOBT +, hgb 12-11.3-12 -Asa/plavix/xarelto held since admission on 7/25, she was treated with  ppi drip, now on oral ppi. -EGD on Monday negative for source of bleeding, +a minimal Schatzki's ring and a moderate-sized hiatal hernia. -Per  [facility] Dr Matthias Hughs: " Okay from GI standpoint to resume anticoagulation at any time. However, I would recommend that this patient be maintained on PPI prophylaxis indefinitely, if she is going to resume ulcerogenic medications such as aspirin." -there is a concern on aspiration toward the end of her egd on 7/30 (detail please see Dr Buccin's note from 7/30), she does not have fever, no increased cough, will continue monitor.  -epigastric pain presented on admission has resolved.  Acute on chronic systolic CHF exacerbation:  -LVEF in 7/17 was 45-50%, -Repeat echo on 7/26, lvef down to 30-35% -Ct chest  On 7/26 " Third spacing of fluid, with small bilateral pleural effusions, small amount of ascites, flank edema, retroperitoneal edema (left greater than right), and presacral edema." -She received lasix on 7/26,  7/29 , 7/30 and 7/31 -meds per cardiology, cardiology input appreciated   CAD with Recent h/o STEMI with three vessel disease, s/p stent on 01/30/2017, plan to have CABG after a few weeks DAPT, -patient denies chest pain, she had epigastric pain on admission, DAPT therapy held due to concerns of GI bleed -Cardiology consulted, she is started on aggrastat on 7/28 in anticipating EGD -she has EGD on 7/30, now off aggrastat, she is started back on plavix and xarelto, cardiology continue to adjust meds.  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   Leukocytosis: -procalcitonin peaked at 2.85 on 7/27 trending down, lactic acidosis peaked at 5.2 on 7/26 , trended down, wbc peaked at 28.5 on 7/26, trended down -No fever, blood culture no growth, urine culture multiple organisms, non predominant , sputum culture with normal respiratory flora -She received vanc/zosyn/zithr in the ED on 7/25, she has been observed off abx  Hypokalemia/hypomagnesemia: replace k/mag   FTT: she did well with PT on 7/30,  home health RN arranged  Code Status: full  Family Communication: patient , husband  at bedside  Disposition Plan: possible discharge in 1-2 days with cardiology clearance   Consultants:  Critical care  Cardiology  [facility]  Procedures:  bipap  egd on 7/30  Antibiotics:  Vanc/zosyn /zithro on 7/25     Objective: BP (!) 158/113 (BP Location: Left Arm) Comment: paged MD and cardiology  Pulse (!) 103   Temp 98.9 F (37.2 C) (Oral)   Resp 18   Ht 5\' 6"  (1.676 m)   Wt 90.9 kg (200 lb 6.4 oz)   SpO2 98%   BMI 32.35 kg/m   Intake/Output Summary (Last 24 hours) at 02/16/17 1614 Last data filed at 02/16/17 1519  Gross per 24 hour  Intake              950 ml  Output             2750 ml  Net            -1800 ml   Filed Weights   02/15/17 0534 02/15/17 1241 02/16/17 0543  Weight: 89.7 kg (197 lb 12.8 oz) 88.5 kg (195 lb) 90.9 kg (200 lb 6.4 oz)    Exam:   General:  NAD  Cardiovascular: IRRR  Respiratory: improved aeration, scattered rales at basis, no wheezing, no rhonchi  Abdomen: Soft/ND/NT, positive BS  Musculoskeletal: legs are less tight , started to wrinkle  Neuro: aaox3  Data Reviewed: Basic Metabolic Panel:  Recent Labs Lab 02/12/17 0049 02/13/17 0438 02/14/17 0443 02/15/17 0646 02/16/17 0347  NA 135 134* 136 139 137  K 4.7 3.7 4.3 3.6 4.0  CL 106 107 108 106 105  CO2 18* 18* 20* 26 24  GLUCOSE  125* 117* 110* 93 113*  BUN 63* 50* 35* 22* 23*  CREATININE 2.25* 1.74* 1.27* 1.02* 0.89  CALCIUM 7.9* 7.4* 8.0* 8.0* 8.1*  MG 1.9 1.8  --  1.4* 1.7  PHOS 6.3* 3.3  --   --   --    Liver Function Tests: No results for input(s): AST, ALT, ALKPHOS, BILITOT, PROT, ALBUMIN in the last 168 hours.  Recent Labs Lab 02/11/17 0124  LIPASE 21   No results for input(s): AMMONIA in the last 168 hours. CBC:  Recent Labs Lab 02/13/17 0438 02/13/17 2007 02/14/17 0443 02/15/17 0646 02/16/17 0347  WBC 18.3* 19.2* 17.9* 18.3* 18.3*  HGB 11.3* 12.1 12.2 12.4 12.3  HCT 34.1* 37.1 36.9 38.5 37.8  MCV 81.0 81.5 80.9 81.2 82.0  PLT 177 206 206 220 245   Cardiac Enzymes:    Recent Labs Lab 02/11/17 0948 02/11/17 1236 02/11/17 1550 02/11/17 1851  02/12/17 0049  TROPONINI 0.20* 0.21* 0.26* 0.28* 0.26*   BNP (last 3 results)  Recent Labs  02/10/17 2030  BNP 2,783.2*    ProBNP (last 3 results) No results for input(s): PROBNP in the last 8760 hours.  CBG:  Recent Labs Lab 02/10/17 2236  GLUCAP 107*    Recent Results (from the past 240 hour(s))  Culture, Urine     Status: Abnormal   Collection Time: 02/10/17 10:57 PM  Result Value Ref Range Status   Specimen Description URINE, RANDOM  Final   Special Requests NONE  Final   Culture MULTIPLE ORGANISMS PRESENT, NONE PREDOMINANT (A)  Final   Report Status 02/12/2017 FINAL  Final  Respiratory Panel by PCR     Status: None   Collection Time: 02/11/17  1:54 AM  Result Value Ref Range Status   Adenovirus NOT DETECTED NOT DETECTED Final   Coronavirus 229E NOT DETECTED NOT DETECTED Final   Coronavirus HKU1 NOT DETECTED NOT DETECTED Final   Coronavirus NL63 NOT DETECTED NOT DETECTED Final   Coronavirus OC43 NOT DETECTED NOT DETECTED Final   Metapneumovirus NOT DETECTED NOT DETECTED Final   Rhinovirus / Enterovirus NOT DETECTED NOT DETECTED Final   Influenza A NOT DETECTED NOT DETECTED Final   Influenza B NOT DETECTED NOT  DETECTED Final   Parainfluenza Virus 1 NOT DETECTED NOT DETECTED Final   Parainfluenza Virus 2 NOT DETECTED NOT DETECTED Final   Parainfluenza Virus 3 NOT DETECTED NOT DETECTED Final   Parainfluenza Virus 4 NOT DETECTED NOT DETECTED Final   Respiratory Syncytial Virus NOT DETECTED NOT DETECTED Final   Bordetella pertussis NOT DETECTED NOT DETECTED Final   Chlamydophila pneumoniae NOT DETECTED NOT DETECTED Final   Mycoplasma pneumoniae NOT DETECTED NOT DETECTED Final  C difficile quick screen w PCR reflex     Status: None   Collection Time: 02/11/17  1:55 AM  Result Value Ref Range Status   C Diff antigen NEGATIVE NEGATIVE Final   C Diff toxin NEGATIVE NEGATIVE Final   C Diff interpretation No C. difficile detected.  Final  Culture, blood (routine x 2) Call MD if unable to obtain prior to antibiotics being given     Status: None   Collection Time: 02/11/17  9:53 AM  Result Value Ref Range Status   Specimen Description BLOOD LEFT ANTECUBITAL  Final   Special Requests IN PEDIATRIC BOTTLE Blood Culture adequate volume  Final   Culture NO GROWTH 5 DAYS  Final   Report Status 02/16/2017 FINAL  Final  MRSA PCR Screening     Status: None   Collection Time: 02/11/17  9:56 AM  Result Value Ref Range Status   MRSA by PCR NEGATIVE NEGATIVE Final    Comment:        The GeneXpert MRSA Assay (FDA approved for NASAL specimens only), is one component of a comprehensive MRSA colonization surveillance program. It is not intended to diagnose MRSA infection nor to guide or monitor treatment for MRSA infections.   Culture, blood (routine x 2) Call MD if unable to obtain prior to antibiotics being given     Status: None   Collection Time: 02/11/17 10:00 AM  Result Value Ref Range Status   Specimen Description BLOOD LEFT ARM  Final   Special Requests IN PEDIATRIC BOTTLE Blood Culture adequate volume  Final   Culture NO GROWTH 5 DAYS  Final   Report Status 02/16/2017 FINAL  Final  Culture,  sputum-assessment     Status: None  Collection Time: 02/12/17  9:54 AM  Result Value Ref Range Status   Specimen Description SPUTUM  Final   Special Requests NONE  Final   Sputum evaluation THIS SPECIMEN IS ACCEPTABLE FOR SPUTUM CULTURE  Final   Report Status 02/12/2017 FINAL  Final  Culture, respiratory (NON-Expectorated)     Status: None   Collection Time: 02/12/17  9:54 AM  Result Value Ref Range Status   Specimen Description SPUTUM  Final   Special Requests NONE Reflexed from [id number]  Final   Gram Stain   Final    MODERATE WBC PRESENT, PREDOMINANTLY PMN RARE GRAM POSITIVE COCCI    Culture Consistent with normal respiratory flora.  Final   Report Status 02/14/2017 FINAL  Final     Studies: No results found.  Scheduled Meds: . atorvastatin  80 mg Oral q1800  . clopidogrel  75 mg Oral Daily  . dextromethorphan-guaiFENesin  1 tablet Oral BID  . diltiazem  120 mg Oral Daily  . furosemide  40 mg Intravenous BID  . mouth rinse  15 mL Mouth Rinse BID  . metoprolol tartrate  100 mg Oral BID  . nicotine  21 mg Transdermal Daily  . omega-3 acid ethyl esters  1 g Oral Daily  . pantoprazole  40 mg Oral BID  . rivaroxaban  15 mg Oral Q supper    Continuous Infusions: . sodium chloride 10 mL (02/12/17 0209)     Time spent:  Arsenia Goracke MD, PhD  [facility] Pager [phone]. If 7PM-7AM, please contact night-coverage at [email or web address], password [facility] 02/16/2017, 4:14 PM  LOS: 5 days

## 2017-02-16 NOTE — Progress Notes (Signed)
Pt requesting requip at bedtime for her legs. Informed MD, MD aware. MD stated pt may be discharged if cardiology agrees. Pt aware   Jesi Jurgens [employer]

## 2017-02-17 DIAGNOSIS — J9601 Acute respiratory failure with hypoxia: Secondary | ICD-10-CM

## 2017-02-17 DIAGNOSIS — J189 Pneumonia, unspecified organism: Secondary | ICD-10-CM

## 2017-02-17 DIAGNOSIS — N170 Acute kidney failure with tubular necrosis: Secondary | ICD-10-CM

## 2017-02-17 DIAGNOSIS — N182 Chronic kidney disease, stage 2 (mild): Secondary | ICD-10-CM

## 2017-02-17 HISTORY — DX: Pneumonia, unspecified organism: J18.9

## 2017-02-17 LAB — BASIC METABOLIC PANEL
Anion gap: 7 (ref 5–15)
BUN: 19 mg/dL (ref 6–20)
CALCIUM: 8.5 mg/dL — AB (ref 8.9–10.3)
CO2: 28 mmol/L (ref 22–32)
Chloride: 102 mmol/L (ref 101–111)
Creatinine, Ser: 0.97 mg/dL (ref 0.44–1.00)
GFR calc Af Amer: >60 mL/min (ref >60)
GFR, EST NON AFRICAN AMERICAN: 56 mL/min — AB (ref >60)
Glucose, Bld: 129 mg/dL — ABNORMAL HIGH (ref 65–99)
POTASSIUM: 4.2 mmol/L (ref 3.5–5.1)
SODIUM: 137 mmol/L (ref 135–145)

## 2017-02-17 LAB — CBC
HCT: 37.5 % (ref 36.0–46.0)
Hemoglobin: 12.1 g/dL (ref 12.0–15.0)
MCH: 26.5 pg (ref 26.0–34.0)
MCHC: 32.3 g/dL (ref 30.0–36.0)
MCV: 82.2 fL (ref 78.0–100.0)
PLATELETS: 246 10*3/uL (ref 150–400)
RBC: 4.56 MIL/uL (ref 3.87–5.11)
RDW: 17 % — AB (ref 11.5–15.5)
WBC: 17.5 10*3/uL — ABNORMAL HIGH (ref 4.0–10.5)

## 2017-02-17 LAB — MAGNESIUM: MAGNESIUM: 1.6 mg/dL — AB (ref 1.7–2.4)

## 2017-02-17 MED ORDER — DILTIAZEM HCL ER COATED BEADS 180 MG PO CP24
180.0000 mg | ORAL_CAPSULE | Freq: Every day | ORAL | Status: DC
  Administered 2017-02-18: 180 mg via ORAL
  Filled 2017-02-17: qty 1

## 2017-02-17 NOTE — Progress Notes (Signed)
Pt complained of not getting much sleep on the previous night due to restless leg syndrome. MD paged for Requip, which is on pt's PTA MAR although she reports not taking it for years. Will continue to monitor.

## 2017-02-17 NOTE — Progress Notes (Signed)
Progress Note  Patient Name: Christy Campbell Date of Encounter: 02/17/2017  Primary Cardiologist: Allyson Sabal  Subjective   Feeling better this morning, SOB has improved.  Blood pressure (!) 144/95, pulse 92, temperature 98.9 F (37.2 C), temperature source Oral, resp. rate 20, height 5\' 6"  (1.676 m), weight 200 lb 6.4 oz (90.9 kg), SpO2 100 %.   Inpatient Medications    Scheduled Meds: . atorvastatin  80 mg Oral q1800  . clopidogrel  75 mg Oral Daily  . dextromethorphan-guaiFENesin  1 tablet Oral BID  . diltiazem  120 mg Oral Daily  . furosemide  40 mg Intravenous BID  . mouth rinse  15 mL Mouth Rinse BID  . metoprolol tartrate  100 mg Oral BID  . nicotine  21 mg Transdermal Daily  . omega-3 acid ethyl esters  1 g Oral Daily  . pantoprazole  40 mg Oral BID  . rivaroxaban  15 mg Oral Q supper   Continuous Infusions: . sodium chloride 10 mL (02/12/17 0209)   PRN Meds: sodium chloride, fentaNYL (SUBLIMAZE) injection, ipratropium, levalbuterol, ondansetron   Vital Signs    Vitals:   02/16/17 2038 02/16/17 2314 02/17/17 0409 02/17/17 1237  BP: (!) 150/94 (!) 143/92 (!) 142/84 108/73  Pulse: 81  87 97  Resp: 18  18 19   Temp: 98.6 F (37 C)  99.2 F (37.3 C)   TempSrc: Oral  Oral   SpO2: 99%  99% 97%  Weight:   201 lb (91.2 kg)   Height:        Intake/Output Summary (Last 24 hours) at 02/17/17 1310 Last data filed at 02/17/17 1239  Gross per 24 hour  Intake              600 ml  Output             3745 ml  Net            -3145 ml   Filed Weights   02/15/17 1241 02/16/17 0543 02/17/17 0409  Weight: 195 lb (88.5 kg) 200 lb 6.4 oz (90.9 kg) 201 lb (91.2 kg)    Telemetry    Atrial fibrillation, variable rates  - Personally Reviewed   Physical Exam   GEN: Well nourished, well developed HEENT: normal  Neck: no JVD, carotid bruits, or masses Cardiac: + atrial fibrillation. no murmurs, rubs, or gallops,no edema. Intact distal pulses bilaterally.    Respiratory: clear to auscultation bilaterally, normal work of breathing, becomes mildly SOB with long sentences.  GI: soft, nontender, nondistended, + BS MS: no deformity or atrophy  Skin: warm and dry, no rash Neuro: Alert and Oriented x 3, Strength and sensation are intact Psych:   Full affect  Labs    Chemistry  Recent Labs Lab 02/15/17 0646 02/16/17 0347 02/17/17 0249  NA 139 137 137  K 3.6 4.0 4.2  CL 106 105 102  CO2 26 24 28   GLUCOSE 93 113* 129*  BUN 22* 23* 19  CREATININE 1.02* 0.89 0.97  CALCIUM 8.0* 8.1* 8.5*  GFRNONAA 52* >60 56*  GFRAA >60 >60 >60  ANIONGAP 7 8 7      Hematology  Recent Labs Lab 02/15/17 0646 02/16/17 0347 02/17/17 0249  WBC 18.3* 18.3* 17.5*  RBC 4.74 4.61 4.56  HGB 12.4 12.3 12.1  HCT 38.5 37.8 37.5  MCV 81.2 82.0 82.2  MCH 26.2 26.7 26.5  MCHC 32.2 32.5 32.3  RDW 16.7* 17.0* 17.0*  PLT 220 245 246  Cardiac Enzymes  Recent Labs Lab 02/11/17 1236 02/11/17 1550 02/11/17 1851 02/12/17 0049  TROPONINI 0.21* 0.26* 0.28* 0.26*     Recent Labs Lab 02/10/17 2121  TROPIPOC 0.09*     BNP  Recent Labs Lab 02/10/17 2030  BNP 2,783.2*     DDimer   Recent Labs Lab 02/10/17 2030  DDIMER 2.28*     Radiology    No results found.  Cardiac Studies   02/02/2017 Echocardiogram Study Conclusions  - Left ventricle: The cavity size was normal. Wall thickness was   increased in a pattern of mild LVH. Systolic function was mildly   reduced. The estimated ejection fraction was in the range of 45%   to 50%. There is hypokinesis of the basal-midinferior myocardium. - Aortic valve: Trileaflet; mildly thickened, mildly calcified   leaflets. - Mitral valve: There was moderate regurgitation. - Left atrium: The atrium was mildly dilated. - Right atrium: The atrium was mildly dilated. - Tricuspid valve: There was moderate regurgitation directed   eccentrically and toward the septum. - Pulmonary arteries: Systolic  pressure was moderately increased.   PA peak pressure: 52 mm Hg (S).  02/12/2017 Limited Echocardiogram Study Conclusions  - Left ventricle: The cavity size was normal. There was mild   concentric hypertrophy. Systolic function was moderately to   severely reduced. The estimated ejection fraction was in the   range of 30% to 35%. Moderate diffuse hypokinesis with distinct   regional wall motion abnormalities. Severe hypokinesis of the   basal-midinferolateral, inferior, and inferoseptal myocardium. - Mitral valve: There was mild to moderate regurgitation directed   centrally. - Left atrium: The atrium was moderately to severely dilated. - Right ventricle: The cavity size was mildly dilated. Systolic   function was moderately reduced. - Right atrium: The atrium was mildly dilated. - Tricuspid valve: There was moderate-severe regurgitation directed   centrally. - Pulmonary arteries: Systolic pressure was moderately increased.   PA peak pressure: 55 mm Hg (S).  Impressions:  - Image quality has deteriorated compared to February 02, 2017 study.   Nevertheless, overall LV systolic function appears a little worse   and there is more evident RV dysfunction.   Findings are consistent with extensive ischemia/nfarction in the   right coronary artery distribution, with RV involvement.    Patient Profile     Christy Campbell is a 76 y.o. female with a history of PVD with s/p multiple PTA procedures with right common iliac artery stent, left SFA stent, left SFA directional atherectomy 2013, abnormal nuclear stress test 07/2014 with mild anteroapical ischemia with no angina and persistent atrial fibrillation s/p DCCV 07/2016 on Xarelto with last dose last night    Assessment & Plan   1. Atrial fibrillation: HR still in 80-110 range, I will increase cardizem CD to 180 mg po daily. Started on Xarelto 15 mg po daily (per pharmacy PIONEER trial), no bleeding  2. CAD, recent inferior STEMI BMS  to RCA on 01/30/17:  Holding aspirin, continue  plavix and Xarelto (7/25)  2. Systolic diastolic CHF: LVEF 30-35%, Has a net loss of 6 liters this admission. Still fluid overloaded, I will continue lasix 40 mg iv BID till tomorrow, then switch to 40 mg po daily at discharge, she wasn't on any diuretics prior to the admission.   4. GI bleed: GI managing  5. Renal insufficiency: Creatinine is 0.97.  Anticipated discharge tomorrow.  Tobias Alexander, MD 02/17/2017

## 2017-02-17 NOTE — Progress Notes (Signed)
PROGRESS NOTE  Christy Campbell ZOX:[id number] DOB: 09/21/1940 DOA: 02/10/2017 PCP: Don Broach, [facility]  Patient was discharged from the hospital after treated for STEMI from 7/14-7/19, she was taken back to [place] ER on 7/25 evening due to sob, cough, and blood in the stool, she developed Hypotension sbp in 80's , respiratory distress RR in 40's , lethargy early am on 7/26 possible from chf exacerbation, gi bleed, ? Sepsis (with leukocytosis, lactic acidosis, though no fever),she was not stable to admit to stepdown, she may need pressors , critical care consulted, she was admitted to icu She received one dose of vanc/zosyn/zithro in the ER, she received lasix, she has improved, she is transferred out of icu and back on hospitalist service.  Cardiology continue adjusting meds, hopefully able to discharge in 1-2 days with cardiology clearance.   HPI/Recap of past 24 hours:  Patient has no new complaints. No acute issues overnight. Breathing situation improving   Assessment/Plan: Principal Problem:   Acute on chronic respiratory failure with hypoxia (HCC) Active Problems:   Dyslipidemia   Smoker   Atrial fibrillation with RVR (HCC)   Nausea vomiting and diarrhea   CAD (coronary artery disease)   HCAP (healthcare-associated pneumonia)   Sepsis (HCC)   Elevated troponin   Acute renal failure superimposed on stage 2 chronic kidney disease (HCC)   GIB (gastrointestinal bleeding)   Acute on chronic systolic CHF (congestive heart failure) (HCC)   Acute respiratory failure with hypoxia (HCC)   Ischemic cardiomyopathy    GI bleed, preseted with epigastric pain, FOBT +, hgb 12-11.3-12 -Asa/plavix/xarelto held since admission on 7/25, she was treated with  ppi drip, now on oral ppi. -EGD on Monday negative for source of bleeding, +a minimal Schatzki's ring and a moderate-sized hiatal hernia. -Per [facility] Dr Matthias Hughs: " Okay from GI standpoint to resume anticoagulation at  any time. However, I would recommend that this patient be maintained on PPI prophylaxis indefinitely, if she is going to resume ulcerogenic medications such as aspirin." - there is a concern on aspiration toward the end of her egd on 7/30 (detail please see Dr Buccin's note from 7/30), she does not have fever, no increased cough, will continue monitor.   Acute on chronic systolic CHF exacerbation:  - LVEF in 7/17 was 45-50%, -Repeat echo on 7/26, lvef down to 30-35% - Ct chest  On 7/26 " Third spacing of fluid, with small bilateral pleural effusions, small amount of ascites, flank edema, retroperitoneal edema (left greater than right), and presacral edema." - She received lasix on 7/26,  7/29 , 7/30 and 7/31  CAD with Recent h/o STEMI with three vessel disease, s/p stent on 01/30/2017, plan to have CABG after a few weeks DAPT,  -stable, no chest pain reported. DAPT therapy held due to concerns of GI bleed -Cardiology consulted, she is started on aggrastat on 7/28 in anticipating EGD -she has EGD on 7/30, now off aggrastat, she is started back on plavix and xarelto, cardiology managing  AFib/RVR:  -course complicated with hypotension, gi bleed initally -remain in afib, Currently on metoprolol 100mg  bid (dose increased on 7/29), cardizem ( started on 7/31) -Anticoagulation held initially due to concerns of gi bleed, xarelto resumed on 7/30 -cardiology continue to adjust meds  AKI on CKD III - in the setting of gi bleed , chf and possible sepsis,  -ua with many bacteremia, wbc 0-5, rbc 0-5, negative nitrite, negative leukocyte esterase, urine culture multiple organisms, non predominant  -  cr peaked at 2.25, trending down, cr 0.89 on 7/31 -renal dosing meds,  Lisinopril has been on hold since admission, cardiology to decide on resuming lisinopril.   Leukocytosis: -procalcitonin peaked at 2.85 on 7/27 trending down, lactic acidosis peaked at 5.2 on 7/26 , trended down, wbc peaked at 28.5 on  7/26, trended down -No fever, blood culture no growth, urine culture multiple organisms, non predominant , sputum culture with normal respiratory flora -She received vanc/zosyn/zithr in the ED on 7/25, she has been observed off abx  Hypokalemia/hypomagnesemia: replace k/mag  FTT: she did well with PT on 7/30,  home health RN arranged  Code Status: full  Family Communication: Pt and family at bedside  Disposition Plan: possible discharge in 1-2 days with cardiology clearance   Consultants:  Critical care  Cardiology  [facility]  Procedures:  bipap  egd on 7/30  Antibiotics:  Vanc/zosyn /zithro on 7/25     Objective: BP 108/73 (BP Location: Left Arm)   Pulse 97   Temp 99.2 F (37.3 C) (Oral)   Resp 19   Ht 5\' 6"  (1.676 m)   Wt 91.2 kg (201 lb)   SpO2 97%   BMI 32.44 kg/m   Intake/Output Summary (Last 24 hours) at 02/17/17 1559 Last data filed at 02/17/17 1341  Gross per 24 hour  Intake              600 ml  Output             2945 ml  Net            -2345 ml   Filed Weights   02/15/17 1241 02/16/17 0543 02/17/17 0409  Weight: 88.5 kg (195 lb) 90.9 kg (200 lb 6.4 oz) 91.2 kg (201 lb)    Exam:   General:  NAD, alert and awake  Cardiovascular: IRRR, no rubs  Respiratory: improved aeration, scattered rales at basis, no wheezing, no rhonchi  Abdomen: Soft/ND/NT, positive BS  Musculoskeletal: legs are less tight , started to wrinkle  Neuro: aaox3  Data Reviewed: Basic Metabolic Panel:  Recent Labs Lab 02/12/17 0049 02/13/17 0438 02/14/17 0443 02/15/17 0646 02/16/17 0347 02/17/17 0249  NA 135 134* 136 139 137 137  K 4.7 3.7 4.3 3.6 4.0 4.2  CL 106 107 108 106 105 102  CO2 18* 18* 20* 26 24 28   GLUCOSE 125* 117* 110* 93 113* 129*  BUN 63* 50* 35* 22* 23* 19  CREATININE 2.25* 1.74* 1.27* 1.02* 0.89 0.97  CALCIUM 7.9* 7.4* 8.0* 8.0* 8.1* 8.5*  MG 1.9 1.8  --  1.4* 1.7 1.6*  PHOS 6.3* 3.3  --   --   --   --    Liver Function  Tests: No results for input(s): AST, ALT, ALKPHOS, BILITOT, PROT, ALBUMIN in the last 168 hours.  Recent Labs Lab 02/11/17 0124  LIPASE 21   No results for input(s): AMMONIA in the last 168 hours. CBC:  Recent Labs Lab 02/13/17 2007 02/14/17 0443 02/15/17 0646 02/16/17 0347 02/17/17 0249  WBC 19.2* 17.9* 18.3* 18.3* 17.5*  HGB 12.1 12.2 12.4 12.3 12.1  HCT 37.1 36.9 38.5 37.8 37.5  MCV 81.5 80.9 81.2 82.0 82.2  PLT 206 206 220 245 246   Cardiac Enzymes:    Recent Labs Lab 02/11/17 0948 02/11/17 1236 02/11/17 1550 02/11/17 1851 02/12/17 0049  TROPONINI 0.20* 0.21* 0.26* 0.28* 0.26*   BNP (last 3 results)  Recent Labs  02/10/17 2030  BNP 2,783.2*  ProBNP (last 3 results) No results for input(s): PROBNP in the last 8760 hours.  CBG:  Recent Labs Lab 02/10/17 2236  GLUCAP 107*    Recent Results (from the past 240 hour(s))  Culture, Urine     Status: Abnormal   Collection Time: 02/10/17 10:57 PM  Result Value Ref Range Status   Specimen Description URINE, RANDOM  Final   Special Requests NONE  Final   Culture MULTIPLE ORGANISMS PRESENT, NONE PREDOMINANT (A)  Final   Report Status 02/12/2017 FINAL  Final  Respiratory Panel by PCR     Status: None   Collection Time: 02/11/17  1:54 AM  Result Value Ref Range Status   Adenovirus NOT DETECTED NOT DETECTED Final   Coronavirus 229E NOT DETECTED NOT DETECTED Final   Coronavirus HKU1 NOT DETECTED NOT DETECTED Final   Coronavirus NL63 NOT DETECTED NOT DETECTED Final   Coronavirus OC43 NOT DETECTED NOT DETECTED Final   Metapneumovirus NOT DETECTED NOT DETECTED Final   Rhinovirus / Enterovirus NOT DETECTED NOT DETECTED Final   Influenza A NOT DETECTED NOT DETECTED Final   Influenza B NOT DETECTED NOT DETECTED Final   Parainfluenza Virus 1 NOT DETECTED NOT DETECTED Final   Parainfluenza Virus 2 NOT DETECTED NOT DETECTED Final   Parainfluenza Virus 3 NOT DETECTED NOT DETECTED Final   Parainfluenza Virus 4  NOT DETECTED NOT DETECTED Final   Respiratory Syncytial Virus NOT DETECTED NOT DETECTED Final   Bordetella pertussis NOT DETECTED NOT DETECTED Final   Chlamydophila pneumoniae NOT DETECTED NOT DETECTED Final   Mycoplasma pneumoniae NOT DETECTED NOT DETECTED Final  C difficile quick screen w PCR reflex     Status: None   Collection Time: 02/11/17  1:55 AM  Result Value Ref Range Status   C Diff antigen NEGATIVE NEGATIVE Final   C Diff toxin NEGATIVE NEGATIVE Final   C Diff interpretation No C. difficile detected.  Final  Culture, blood (routine x 2) Call MD if unable to obtain prior to antibiotics being given     Status: None   Collection Time: 02/11/17  9:53 AM  Result Value Ref Range Status   Specimen Description BLOOD LEFT ANTECUBITAL  Final   Special Requests IN PEDIATRIC BOTTLE Blood Culture adequate volume  Final   Culture NO GROWTH 5 DAYS  Final   Report Status 02/16/2017 FINAL  Final  MRSA PCR Screening     Status: None   Collection Time: 02/11/17  9:56 AM  Result Value Ref Range Status   MRSA by PCR NEGATIVE NEGATIVE Final    Comment:        The GeneXpert MRSA Assay (FDA approved for NASAL specimens only), is one component of a comprehensive MRSA colonization surveillance program. It is not intended to diagnose MRSA infection nor to guide or monitor treatment for MRSA infections.   Culture, blood (routine x 2) Call MD if unable to obtain prior to antibiotics being given     Status: None   Collection Time: 02/11/17 10:00 AM  Result Value Ref Range Status   Specimen Description BLOOD LEFT ARM  Final   Special Requests IN PEDIATRIC BOTTLE Blood Culture adequate volume  Final   Culture NO GROWTH 5 DAYS  Final   Report Status 02/16/2017 FINAL  Final  Culture, sputum-assessment     Status: None   Collection Time: 02/12/17  9:54 AM  Result Value Ref Range Status   Specimen Description SPUTUM  Final   Special Requests NONE  Final  Sputum evaluation THIS SPECIMEN IS  ACCEPTABLE FOR SPUTUM CULTURE  Final   Report Status 02/12/2017 FINAL  Final  Culture, respiratory (NON-Expectorated)     Status: None   Collection Time: 02/12/17  9:54 AM  Result Value Ref Range Status   Specimen Description SPUTUM  Final   Special Requests NONE Reflexed from [id number]  Final   Gram Stain   Final    MODERATE WBC PRESENT, PREDOMINANTLY PMN RARE GRAM POSITIVE COCCI    Culture Consistent with normal respiratory flora.  Final   Report Status 02/14/2017 FINAL  Final     Studies: No results found.  Scheduled Meds: . atorvastatin  80 mg Oral q1800  . clopidogrel  75 mg Oral Daily  . dextromethorphan-guaiFENesin  1 tablet Oral BID  . [START ON 02/18/2017] diltiazem  180 mg Oral Daily  . furosemide  40 mg Intravenous BID  . mouth rinse  15 mL Mouth Rinse BID  . metoprolol tartrate  100 mg Oral BID  . nicotine  21 mg Transdermal Daily  . omega-3 acid ethyl esters  1 g Oral Daily  . pantoprazole  40 mg Oral BID  . rivaroxaban  15 mg Oral Q supper    Continuous Infusions: . sodium chloride 10 mL (02/12/17 0454)     Time spent: 35 mins  Penny Pia MD, PhD  [facility] Pager [phone]. If 7PM-7AM, please contact night-coverage at [email or web address], password [facility] 02/17/2017, 3:59 PM  LOS: 6 days

## 2017-02-18 LAB — CBC
HCT: 34.9 % — ABNORMAL LOW (ref 36.0–46.0)
Hemoglobin: 11.3 g/dL — ABNORMAL LOW (ref 12.0–15.0)
MCH: 27 pg (ref 26.0–34.0)
MCHC: 32.4 g/dL (ref 30.0–36.0)
MCV: 83.3 fL (ref 78.0–100.0)
PLATELETS: 240 10*3/uL (ref 150–400)
RBC: 4.19 MIL/uL (ref 3.87–5.11)
RDW: 17.2 % — AB (ref 11.5–15.5)
WBC: 14.8 10*3/uL — ABNORMAL HIGH (ref 4.0–10.5)

## 2017-02-18 MED ORDER — FUROSEMIDE 40 MG PO TABS: 40.0000 mg | ORAL_TABLET | Freq: Every day | ORAL | Status: DC

## 2017-02-18 MED ORDER — DILTIAZEM HCL ER COATED BEADS 180 MG PO CP24: 180.0000 mg | ORAL_CAPSULE | Freq: Every day | ORAL | 0 refills | Status: DC

## 2017-02-18 MED ORDER — FUROSEMIDE 40 MG PO TABS: 40.0000 mg | ORAL_TABLET | Freq: Every day | ORAL | 0 refills | Status: DC

## 2017-02-18 MED ORDER — PANTOPRAZOLE SODIUM 40 MG PO TBEC: 40.0000 mg | DELAYED_RELEASE_TABLET | Freq: Two times a day (BID) | ORAL | 0 refills | Status: AC

## 2017-02-18 MED ORDER — RIVAROXABAN 15 MG PO TABS: 15.0000 mg | ORAL_TABLET | Freq: Every day | ORAL | 0 refills | Status: DC

## 2017-02-18 NOTE — Discharge Summary (Signed)
Physician Discharge Summary  Christy Campbell ZOX:[id number] DOB: 04/07/1941 DOA: 02/10/2017  PCP: Don Broach, [facility]  Admit date: 02/10/2017 Discharge date: 02/18/2017  Time spent: > 35 minutes  Recommendations for Outpatient Follow-up:  Monitor serum creatinine while on lasix Ensure follow up with cardiologist   Discharge Diagnoses:  Principal Problem:   Acute on chronic respiratory failure with hypoxia (HCC) Active Problems:   Dyslipidemia   Smoker   Atrial fibrillation with RVR (HCC)   Nausea vomiting and diarrhea   CAD (coronary artery disease)   HCAP (healthcare-associated pneumonia)   Sepsis (HCC)   Elevated troponin   Acute renal failure superimposed on stage 2 chronic kidney disease (HCC)   GIB (gastrointestinal bleeding)   Acute on chronic systolic CHF (congestive heart failure) (HCC)   Acute respiratory failure with hypoxia (HCC)   Ischemic cardiomyopathy   Discharge Condition: stable  Diet recommendation: heart healthy  Filed Weights   02/16/17 0543 02/17/17 0409 02/18/17 0500  Weight: 90.9 kg (200 lb 6.4 oz) 91.2 kg (201 lb) 84.1 kg (185 lb 6.4 oz)    History of present illness:  Christy Campbell a 76 yo female with recent STEMI, A fib on xarelto, ASA, plavix.  Developed melanotic stools, A fib with RVR, dyspnea.  Given IV fluid for lactic acidosis, and lopressor for a fib.  Developed hypoxia and hypotension. Critical care subsequently admitted patient and eventually patient was transferred to hospitalist service after improvement in condition. Cardiology was consulted and assisted with case.  Hospital Course:  Per Cardiology: 1. Atrial fibrillation: HR now in 70-80 range on cardizem CD to 180 mg po daily. Started on Xarelto 15 mg po daily (per pharmacy PIONEER trial), no bleeding  2. CAD, recent inferior STEMI BMS to RCA on 01/30/17:  Holding aspirin, continue  plavix and Xarelto (7/25)  2. Acute on chronic combined systolic and  diastolic CHF: LVEF 30-35%, Has a net loss of 6 liters this admission.  I would switch to 40 mg po daily at discharge, she wasn't on any diuretics prior to the admission.   4. GI bleed: GI managing  5. Renal insufficiency: Creatinine is 0.97.  Stable for a discharge today, we will arrange for a follow up.  GI bleed,presented with epigastric pain, FOBT +, hgb 12-11.3-12 -Asa/plavix/xarelto held since admission on 7/25, she was treated with  ppi drip, now on oral ppi. -EGD on Monday negative for source of bleeding, +a minimal Schatzki's ring and a moderate-sized hiatal hernia. -Per [facility] Dr Matthias Hughs: " Okay from GI standpoint to resume anticoagulation at any time. However, I would recommend that this patient be maintained on PPI prophylaxis indefinitely, if she is going to resume ulcerogenic medications such as aspirin." - there is a concern on aspiration toward the end of her egd on 7/30 (detail please see Dr Buccin's note from 7/30), she does not have fever, no increased cough, no signs of aspiration on evaluation   AKI on CKD III - in the setting of gi bleed , chf and possible sepsis,  -ua with many bacteremia, wbc 0-5, rbc 0-5, negative nitrite, negative leukocyte esterase, urine culture multiple organisms, non predominant  -cr peaked at 2.25, trending down, cr 0.89 on 7/31 -renal dosing meds,  Lisinopril has been on hold since admission, cardiology to decide on resuming lisinopril as outpatient if able.  Leukocytosis: -procalcitonin peaked at 2.85 on 7/27 trending down, lactic acidosis peaked at 5.2 on 7/26 , trended down, wbc peaked at 28.5 on  7/26, trended down -No fever, blood culture no growth, urine culture multiple organisms, non predominant , sputum culture with normal respiratory flora -She received vanc/zosyn/zithr in the ED on 7/25, she has been observed off abx and wbc continues to trend down. No complaints consistent with infectious etiology on  examination.  Procedures:  GI  Consultations:  GI  Cardiology  Discharge Exam: Vitals:   02/18/17 0500 02/18/17 1234  BP: 135/69 139/87  Pulse: 64 75  Resp: 19 18  Temp: 98.4 F (36.9 C) 97.7 F (36.5 C)    General: Pt in nad, alert and awake Cardiovascular: s1 and s2 present, no rubs or gallops Respiratory: no increased wob, no wheezes  Discharge Instructions   Discharge Instructions    Call MD for:  difficulty breathing, headache or visual disturbances    Complete by:  As directed    Call MD for:  extreme fatigue    Complete by:  As directed    Call MD for:  temperature >100.4    Complete by:  As directed    Diet - low sodium heart healthy    Complete by:  As directed    Discharge instructions    Complete by:  As directed    Please be sure to follow up with your cardiologist for further evaluation and recommendations.   Increase activity slowly    Complete by:  As directed      Current Discharge Medication List    START taking these medications   Details  diltiazem (CARDIZEM CD) 180 MG 24 hr capsule Take 1 capsule (180 mg total) by mouth daily. Qty: 30 capsule, Refills: 0    furosemide (LASIX) 40 MG tablet Take 1 tablet (40 mg total) by mouth daily. Qty: 30 tablet, Refills: 0    pantoprazole (PROTONIX) 40 MG tablet Take 1 tablet (40 mg total) by mouth 2 (two) times daily. Qty: 60 tablet, Refills: 0      CONTINUE these medications which have CHANGED   Details  Rivaroxaban (XARELTO) 15 MG TABS tablet Take 1 tablet (15 mg total) by mouth daily with supper. Qty: 30 tablet, Refills: 0      CONTINUE these medications which have NOT CHANGED   Details  acetaminophen (TYLENOL) 325 MG tablet Take 325 mg by mouth 2 (two) times daily as needed. For pain    atorvastatin (LIPITOR) 80 MG tablet Take 1 tablet (80 mg total) by mouth daily at 6 PM. Qty: 30 tablet, Refills: 6    clopidogrel (PLAVIX) 75 MG tablet Take 1 tablet (75 mg total) by mouth daily. Qty:  30 tablet, Refills: 6    HYDROcodone-homatropine (HYCODAN) 5-1.5 MG/5ML syrup Take 5 mLs by mouth every 6 (six) hours. Qty: 120 mL, Refills: 0    metoprolol tartrate (LOPRESSOR) 100 MG tablet Take 1 tablet (100 mg total) by mouth 2 (two) times daily. Qty: 60 tablet, Refills: 6    Multiple Vitamin (MULITIVITAMIN WITH MINERALS) TABS Take 1 tablet by mouth daily.    nitroGLYCERIN (NITROSTAT) 0.4 MG SL tablet Place 1 tablet (0.4 mg total) under the tongue every 5 (five) minutes x 3 doses as needed for chest pain. Qty: 25 tablet, Refills: 12    Omega-3 Fatty Acids (FISH OIL PEARLS) 300 MG CAPS Take 300 mg by mouth daily.    promethazine (PHENERGAN) 12.5 MG tablet Take 12.5 mg by mouth every 6 (six) hours as needed for nausea or vomiting.    vitamin C (ASCORBIC ACID) 500 MG tablet Take 1,000 mg  by mouth daily.       STOP taking these medications     aspirin EC 81 MG EC tablet      lisinopril (PRINIVIL,ZESTRIL) 20 MG tablet      lisinopril (PRINIVIL,ZESTRIL) 40 MG tablet      rOPINIRole (REQUIP) 0.25 MG tablet        Allergies  Allergen Reactions  . Aspirin Nausea And Vomiting and Other (See Comments)    "pain; hurting"  . Tetanus Toxoids Anaphylaxis and Swelling  . Phenergan [Promethazine Hcl] Other (See Comments)    Severe confusion  . Phenergan [Promethazine] Other (See Comments)    Severe confusion  . Hydrocodone Nausea Only  . Oxycodone Nausea Only  . Pravastatin     myalgia   Follow-up Information    Azalee Course, [place] On 03/09/2017.   Specialties:  Cardiology, Radiology Why:  Your appointment is at 3:30 pm. Please arrive 15 minutes early.  Your appointment with [facility] that was for 02/19/2017, has been cancelled. Contact information: [address] [place] [address] [phone]            The results of significant diagnostics from this hospitalization (including imaging, microbiology, ancillary and laboratory) are listed below for  reference.    Significant Diagnostic Studies: Ct Abdomen Pelvis Wo Contrast  Result Date: 02/11/2017 CLINICAL DATA:  Nausea, vomiting, diarrhea, and diffuse abdominal pain since yesterday. Pt also became hypoxic while in ED. Pt had a STEMI last week with stent placement. Was d/c home on saturday. EXAM: CT CHEST, ABDOMEN AND PELVIS WITHOUT CONTRAST TECHNIQUE: Multidetector CT imaging of the chest, abdomen and pelvis was performed following the standard protocol without IV contrast. COMPARISON:  Multiple exams, including chest radiograph from 02/11/2017 and CT scan from 07/18/2014. FINDINGS: CT CHEST FINDINGS Cardiovascular: Coronary, aortic arch, and branch vessel atherosclerotic vascular disease. Moderate cardiomegaly. Mediastinum/Nodes: Right lower paratracheal node at the level the carina measures 1.7 cm in short axis on image 21/4. Other smaller prevascular and paratracheal nodes are observed. Lungs/Pleura: Mild atelectasis in the right middle lobe. Indistinct ground-glass opacities in the superior segment right lower lobe and adjacent portion of the right upper lobe. Subsegmental atelectasis in the left upper lobe and left lower lobe. Bilateral small pleural effusions with passive atelectasis. Loculated fluid in the left major fissure. Musculoskeletal: The patient was unable to raise her arms for imaging, resulting in streak artifact along the abdomen and pelvis which mildly reduces diagnostic sensitivity and specificity. Thoracic spondylosis. Minimal midthoracic wedging and slight superior endplate concavity at T12. CT ABDOMEN PELVIS FINDINGS Hepatobiliary: Cholecystectomy.  Otherwise unremarkable. Pancreas: Unremarkable Spleen: Unremarkable Adrenals/Urinary Tract: Adrenal glands normal. Suspected scarring in the left kidney upper pole. Abnormal left perirenal stranding/edema without hydronephrosis or appreciable nephrolithiasis. Mild likely chronic right perirenal stranding. Suspected cyst of the right  kidney lower pole anteriorly. Borderline fullness of the right ureter without stones identified. Stomach/Bowel: Air fluid level in the rectum suspicious for diarrheal process. Most of the colon is nondistended. Vascular/Lymphatic: Aortoiliac atherosclerotic vascular disease. Reproductive: Uterus not well seen, query hysterectomy. Other: In addition to the left perirenal/retroperitoneal stranding, there is presacral edema which is new compared to 07/18/2014. Small but abnormal amount of free pelvic fluid. Low-level stranding along the pelvic sidewalls. Subcutaneous edema along the lower anterior abdominal walls laterally. Musculoskeletal: Chronic soft tissue defect along the right lateral abdominal wall musculature posteriorly adjacent to the right kidney and in the subcostal region, not appreciably changed from 2015. Old healed fractures of the  right pubic rami. Grade 1 anterolisthesis at L4-5 appears degenerative and is not appreciably changed from 2015. There is lumbar spondylosis and degenerative disc disease with probable central narrowing of the thecal sac at the L4-5 level. Degenerative endplate findings at L5-S1 with vacuum disc phenomenon. I do not see definite cortical discontinuity in the sacrum or a well-defined sacral fracture. IMPRESSION: 1. Third spacing of fluid, with small bilateral pleural effusions, small amount of ascites, flank edema, retroperitoneal edema (left greater than right), and presacral edema. I do not see hydronephrosis for a sacral fracture as a specific cause to suggest an underlying injury or obstruction as a cause for the edema. 2. Moderate cardiomegaly with evidence of coronary artery disease. Aortic Atherosclerosis (ICD10-I70.0). 3. In the major fissure, small portion of the left pleural effusion appears to be loculated. 4. Air-fluid level in the rectum suggestive of diarrheal process. Most of the colon is nondistended. Neither oral nor IV contrast was administered. 5. Lower  lumbar spondylosis and degenerative disc disease, with potential impingement at L4-5. 6. There is an enlarged right lower paratracheal lymph node at 1.7 cm diameter. No prior cross-sectional imaging through this region. The appearance is abnormal but nonspecific and could be due to passive congestion from congestive heart failure, or otherwise occult malignancy. This may warrant surveillance for follow up. 7. Chronic defect in the right posterolateral abdominal wall musculature below the right twelfth rib, with absent visualization of the normal muscular tissues in this vicinity. Electronically Signed   By: Gaylyn Rong M.D.   On: 02/11/2017 10:18   Dg Chest 2 View  Result Date: 02/10/2017 CLINICAL DATA:  Weakness nausea vomiting and dyspnea. Recent hospital discharge after coronary stent placement. EXAM: CHEST  2 VIEW COMPARISON:  01/30/2017 FINDINGS: Left posterior base opacity and effusion, new. Right lung is clear. Pulmonary vasculature is normal. Unchanged moderate cardiomegaly. IMPRESSION: New left base effusion and adjacent airspace opacity. This could be infectious or atelectatic. Unchanged cardiomegaly. Electronically Signed   By: Ellery Plunk M.D.   On: 02/10/2017 21:29   Ct Chest Wo Contrast  Result Date: 02/11/2017 CLINICAL DATA:  Nausea, vomiting, diarrhea, and diffuse abdominal pain since yesterday. Pt also became hypoxic while in ED. Pt had a STEMI last week with stent placement. Was d/c home on saturday. EXAM: CT CHEST, ABDOMEN AND PELVIS WITHOUT CONTRAST TECHNIQUE: Multidetector CT imaging of the chest, abdomen and pelvis was performed following the standard protocol without IV contrast. COMPARISON:  Multiple exams, including chest radiograph from 02/11/2017 and CT scan from 07/18/2014. FINDINGS: CT CHEST FINDINGS Cardiovascular: Coronary, aortic arch, and branch vessel atherosclerotic vascular disease. Moderate cardiomegaly. Mediastinum/Nodes: Right lower paratracheal node at the  level the carina measures 1.7 cm in short axis on image 21/4. Other smaller prevascular and paratracheal nodes are observed. Lungs/Pleura: Mild atelectasis in the right middle lobe. Indistinct ground-glass opacities in the superior segment right lower lobe and adjacent portion of the right upper lobe. Subsegmental atelectasis in the left upper lobe and left lower lobe. Bilateral small pleural effusions with passive atelectasis. Loculated fluid in the left major fissure. Musculoskeletal: The patient was unable to raise her arms for imaging, resulting in streak artifact along the abdomen and pelvis which mildly reduces diagnostic sensitivity and specificity. Thoracic spondylosis. Minimal midthoracic wedging and slight superior endplate concavity at T12. CT ABDOMEN PELVIS FINDINGS Hepatobiliary: Cholecystectomy.  Otherwise unremarkable. Pancreas: Unremarkable Spleen: Unremarkable Adrenals/Urinary Tract: Adrenal glands normal. Suspected scarring in the left kidney upper pole. Abnormal left perirenal stranding/edema without hydronephrosis  or appreciable nephrolithiasis. Mild likely chronic right perirenal stranding. Suspected cyst of the right kidney lower pole anteriorly. Borderline fullness of the right ureter without stones identified. Stomach/Bowel: Air fluid level in the rectum suspicious for diarrheal process. Most of the colon is nondistended. Vascular/Lymphatic: Aortoiliac atherosclerotic vascular disease. Reproductive: Uterus not well seen, query hysterectomy. Other: In addition to the left perirenal/retroperitoneal stranding, there is presacral edema which is new compared to 07/18/2014. Small but abnormal amount of free pelvic fluid. Low-level stranding along the pelvic sidewalls. Subcutaneous edema along the lower anterior abdominal walls laterally. Musculoskeletal: Chronic soft tissue defect along the right lateral abdominal wall musculature posteriorly adjacent to the right kidney and in the subcostal  region, not appreciably changed from 2015. Old healed fractures of the right pubic rami. Grade 1 anterolisthesis at L4-5 appears degenerative and is not appreciably changed from 2015. There is lumbar spondylosis and degenerative disc disease with probable central narrowing of the thecal sac at the L4-5 level. Degenerative endplate findings at L5-S1 with vacuum disc phenomenon. I do not see definite cortical discontinuity in the sacrum or a well-defined sacral fracture. IMPRESSION: 1. Third spacing of fluid, with small bilateral pleural effusions, small amount of ascites, flank edema, retroperitoneal edema (left greater than right), and presacral edema. I do not see hydronephrosis for a sacral fracture as a specific cause to suggest an underlying injury or obstruction as a cause for the edema. 2. Moderate cardiomegaly with evidence of coronary artery disease. Aortic Atherosclerosis (ICD10-I70.0). 3. In the major fissure, small portion of the left pleural effusion appears to be loculated. 4. Air-fluid level in the rectum suggestive of diarrheal process. Most of the colon is nondistended. Neither oral nor IV contrast was administered. 5. Lower lumbar spondylosis and degenerative disc disease, with potential impingement at L4-5. 6. There is an enlarged right lower paratracheal lymph node at 1.7 cm diameter. No prior cross-sectional imaging through this region. The appearance is abnormal but nonspecific and could be due to passive congestion from congestive heart failure, or otherwise occult malignancy. This may warrant surveillance for follow up. 7. Chronic defect in the right posterolateral abdominal wall musculature below the right twelfth rib, with absent visualization of the normal muscular tissues in this vicinity. Electronically Signed   By: Gaylyn Rong M.D.   On: 02/11/2017 10:18   Dg Chest Portable 1 View  Result Date: 02/11/2017 CLINICAL DATA:  Shortness of breath. EXAM: PORTABLE CHEST 1 VIEW  COMPARISON:  02/10/2017 FINDINGS: The patient is rotated to the left. The cardiomediastinal silhouette is unchanged. The lungs are mildly hypoinflated. A small left pleural effusion and patchy left basilar airspace opacity are unchanged. There is unchanged minimal right basilar atelectasis. No pleural effusion is seen. Thoracic spondylosis is noted. IMPRESSION: Unchanged small left pleural effusion and left basilar pneumonia or atelectasis. Electronically Signed   By: Sebastian Ache M.D.   On: 02/11/2017 08:25   Dg Chest Port 1 View  Result Date: 01/30/2017 CLINICAL DATA:  Epigastric abdominal pain EXAM: PORTABLE CHEST 1 VIEW COMPARISON:  06/19/2016 chest radiograph. FINDINGS: Stable cardiomediastinal silhouette with top-normal heart size. No pneumothorax. No pleural effusion. Mild scarring at the lung bases, stable. No pulmonary edema. No acute consolidative airspace disease. IMPRESSION: No active cardiopulmonary disease.  Stable mild bibasilar scarring. Electronically Signed   By: Delbert Phenix M.D.   On: 01/30/2017 21:42    Microbiology: Recent Results (from the past 240 hour(s))  Culture, Urine     Status: Abnormal   Collection Time: 02/10/17  10:57 PM  Result Value Ref Range Status   Specimen Description URINE, RANDOM  Final   Special Requests NONE  Final   Culture MULTIPLE ORGANISMS PRESENT, NONE PREDOMINANT (A)  Final   Report Status 02/12/2017 FINAL  Final  Respiratory Panel by PCR     Status: None   Collection Time: 02/11/17  1:54 AM  Result Value Ref Range Status   Adenovirus NOT DETECTED NOT DETECTED Final   Coronavirus 229E NOT DETECTED NOT DETECTED Final   Coronavirus HKU1 NOT DETECTED NOT DETECTED Final   Coronavirus NL63 NOT DETECTED NOT DETECTED Final   Coronavirus OC43 NOT DETECTED NOT DETECTED Final   Metapneumovirus NOT DETECTED NOT DETECTED Final   Rhinovirus / Enterovirus NOT DETECTED NOT DETECTED Final   Influenza A NOT DETECTED NOT DETECTED Final   Influenza B NOT  DETECTED NOT DETECTED Final   Parainfluenza Virus 1 NOT DETECTED NOT DETECTED Final   Parainfluenza Virus 2 NOT DETECTED NOT DETECTED Final   Parainfluenza Virus 3 NOT DETECTED NOT DETECTED Final   Parainfluenza Virus 4 NOT DETECTED NOT DETECTED Final   Respiratory Syncytial Virus NOT DETECTED NOT DETECTED Final   Bordetella pertussis NOT DETECTED NOT DETECTED Final   Chlamydophila pneumoniae NOT DETECTED NOT DETECTED Final   Mycoplasma pneumoniae NOT DETECTED NOT DETECTED Final  C difficile quick screen w PCR reflex     Status: None   Collection Time: 02/11/17  1:55 AM  Result Value Ref Range Status   C Diff antigen NEGATIVE NEGATIVE Final   C Diff toxin NEGATIVE NEGATIVE Final   C Diff interpretation No C. difficile detected.  Final  Culture, blood (routine x 2) Call MD if unable to obtain prior to antibiotics being given     Status: None   Collection Time: 02/11/17  9:53 AM  Result Value Ref Range Status   Specimen Description BLOOD LEFT ANTECUBITAL  Final   Special Requests IN PEDIATRIC BOTTLE Blood Culture adequate volume  Final   Culture NO GROWTH 5 DAYS  Final   Report Status 02/16/2017 FINAL  Final  MRSA PCR Screening     Status: None   Collection Time: 02/11/17  9:56 AM  Result Value Ref Range Status   MRSA by PCR NEGATIVE NEGATIVE Final    Comment:        The GeneXpert MRSA Assay (FDA approved for NASAL specimens only), is one component of a comprehensive MRSA colonization surveillance program. It is not intended to diagnose MRSA infection nor to guide or monitor treatment for MRSA infections.   Culture, blood (routine x 2) Call MD if unable to obtain prior to antibiotics being given     Status: None   Collection Time: 02/11/17 10:00 AM  Result Value Ref Range Status   Specimen Description BLOOD LEFT ARM  Final   Special Requests IN PEDIATRIC BOTTLE Blood Culture adequate volume  Final   Culture NO GROWTH 5 DAYS  Final   Report Status 02/16/2017 FINAL  Final   Culture, sputum-assessment     Status: None   Collection Time: 02/12/17  9:54 AM  Result Value Ref Range Status   Specimen Description SPUTUM  Final   Special Requests NONE  Final   Sputum evaluation THIS SPECIMEN IS ACCEPTABLE FOR SPUTUM CULTURE  Final   Report Status 02/12/2017 FINAL  Final  Culture, respiratory (NON-Expectorated)     Status: None   Collection Time: 02/12/17  9:54 AM  Result Value Ref Range Status   Specimen Description SPUTUM  Final   Special Requests NONE Reflexed from [id number]  Final   Gram Stain   Final    MODERATE WBC PRESENT, PREDOMINANTLY PMN RARE GRAM POSITIVE COCCI    Culture Consistent with normal respiratory flora.  Final   Report Status 02/14/2017 FINAL  Final     Labs: Basic Metabolic Panel:  Recent Labs Lab 02/12/17 0049 02/13/17 0438 02/14/17 0443 02/15/17 0646 02/16/17 0347 02/17/17 0249  NA 135 134* 136 139 137 137  K 4.7 3.7 4.3 3.6 4.0 4.2  CL 106 107 108 106 105 102  CO2 18* 18* 20* 26 24 28   GLUCOSE 125* 117* 110* 93 113* 129*  BUN 63* 50* 35* 22* 23* 19  CREATININE 2.25* 1.74* 1.27* 1.02* 0.89 0.97  CALCIUM 7.9* 7.4* 8.0* 8.0* 8.1* 8.5*  MG 1.9 1.8  --  1.4* 1.7 1.6*  PHOS 6.3* 3.3  --   --   --   --    Liver Function Tests: No results for input(s): AST, ALT, ALKPHOS, BILITOT, PROT, ALBUMIN in the last 168 hours. No results for input(s): LIPASE, AMYLASE in the last 168 hours. No results for input(s): AMMONIA in the last 168 hours. CBC:  Recent Labs Lab 02/14/17 0443 02/15/17 0646 02/16/17 0347 02/17/17 0249 02/18/17 0318  WBC 17.9* 18.3* 18.3* 17.5* 14.8*  HGB 12.2 12.4 12.3 12.1 11.3*  HCT 36.9 38.5 37.8 37.5 34.9*  MCV 80.9 81.2 82.0 82.2 83.3  PLT 206 220 245 246 240   Cardiac Enzymes:  Recent Labs Lab 02/11/17 1550 02/11/17 1851 02/12/17 0049  TROPONINI 0.26* 0.28* 0.26*   BNP: BNP (last 3 results)  Recent Labs  02/10/17 2030  BNP 2,783.2*    ProBNP (last 3 results) No results for input(s):  PROBNP in the last 8760 hours.  CBG: No results for input(s): GLUCAP in the last 168 hours.   Signed:  Penny Pia MD.  [facility] 02/18/2017, 2:17 PM

## 2017-02-18 NOTE — Care Management Important Message (Signed)
Important Message  Patient Details  Name: Christy Campbell MRN: [id number] Date of Birth: [birth year]   Medicare Important Message Given:  Yes    [name] 02/18/2017, 10:11 AM

## 2017-02-18 NOTE — Progress Notes (Signed)
Physical Therapy Treatment Patient Details Name: Christy Campbell MRN: [id number] DOB: [birth year] Today's Date: 02/18/2017    History of Present Illness Pt is a 76 yo female presenting with complaints of cough and dyspnea present since discharge on 7/19 that have increasingly worsened. Reports productive cough with white sputum production, as well as nausea, vomiting, and diarrhea with mid epigastric pain.PMH of Anemia, Dyslipidemia, GERD, Gastric Ulcers, HTN, PVD, A.Fib on Xarelto, Systolic HF, CKD stage 2, Recent Admission 7/14-7/19 with STEMI with stenting of mid RCA with DES.    PT Comments    Patient overall required supervision for mobility. Pt with c/o SOB ambulation and declined further mobility including stair training. Pt has rollator at home and advised to use upon d/c for safety. SpO2 >90% on RA throughout session. Husband present. Current plan remains appropriate.    Follow Up Recommendations  No PT follow up;Supervision/Assistance - 24 hour     Equipment Recommendations  3in1 (PT)    Recommendations for Other Services       Precautions / Restrictions Precautions Precautions: Fall Restrictions Weight Bearing Restrictions: No    Mobility  Bed Mobility Overal bed mobility: Modified Independent                Transfers Overall transfer level: Needs assistance Equipment used: Rolling walker (2 wheeled) Transfers: Sit to/from Stand Sit to Stand: Supervision         General transfer comment: supervision for safety  Ambulation/Gait Ambulation/Gait assistance: Supervision Ambulation Distance (Feet): 68 Feet Assistive device: Rolling walker (2 wheeled) Gait Pattern/deviations: Step-through pattern;Decreased stride length;Trunk flexed Gait velocity: slowed   General Gait Details: cues for posture and safe use of AD; pt with SOB and took brief standing rest break   Stairs         General stair comments: pt declined stair training  Wheelchair  Mobility    Modified Rankin (Stroke Patients Only)       Balance Overall balance assessment: Needs assistance Sitting-balance support: No upper extremity supported;Feet supported Sitting balance-Leahy Scale: Good     Standing balance support: Bilateral upper extremity supported Standing balance-Leahy Scale: Fair                              Cognition Arousal/Alertness: Awake/alert Behavior During Therapy: WFL for tasks assessed/performed Overall Cognitive Status: Within Functional Limits for tasks assessed                                        Exercises      General Comments General comments (skin integrity, edema, etc.): husband present; pt educated in activity progression and energy conservation techniques as well as safe stair negotiation      Pertinent Vitals/Pain Pain Assessment: No/denies pain    Home Living                      Prior Function            PT Goals (current goals can now be found in the care plan section) Acute Rehab PT Goals Patient Stated Goal: go home PT Goal Formulation: With patient Time For Goal Achievement: 02/23/17 Potential to Achieve Goals: Good Progress towards PT goals: Progressing toward goals    Frequency    Min 3X/week      PT Plan Current plan remains appropriate  Co-evaluation              AM-PAC PT "6 Clicks" Daily Activity  Outcome Measure  Difficulty turning over in bed (including adjusting bedclothes, sheets and blankets)?: A Little Difficulty moving from lying on back to sitting on the side of the bed? : A Little Difficulty sitting down on and standing up from a chair with arms (e.g., wheelchair, bedside commode, etc,.)?: A Little Help needed moving to and from a bed to chair (including a wheelchair)?: None Help needed walking in hospital room?: None Help needed climbing 3-5 steps with a railing? : A Lot 6 Click Score: 19    End of Session Equipment Utilized  During Treatment: Gait belt Activity Tolerance: Patient limited by fatigue Patient left: in bed;with call bell/phone within reach;with family/visitor present (sitting EOB) Nurse Communication: Mobility status PT Visit Diagnosis: Other abnormalities of gait and mobility (R26.89);Muscle weakness (generalized) (M62.81);Difficulty in walking, not elsewhere classified (R26.2)     Time: 3291-9166 PT Time Calculation (min) (ACUTE ONLY): 16 min  Charges:  $Gait Training: 8-22 mins                    G Codes:       Erline Levine, PTA Pager: [phone]     Carolynne Edouard 02/18/2017, 10:28 AM

## 2017-02-18 NOTE — Progress Notes (Signed)
Progress Note  Patient Name: Christy Campbell Date of Encounter: 02/18/2017  Primary Cardiologist: Allyson Sabal  Subjective   Feeling better this morning, SOB has improved.  Blood pressure (!) 144/95, pulse 92, temperature 98.9 F (37.2 C), temperature source Oral, resp. rate 20, height 5\' 6"  (1.676 m), weight 200 lb 6.4 oz (90.9 kg), SpO2 100 %.   Inpatient Medications    Scheduled Meds: . atorvastatin  80 mg Oral q1800  . clopidogrel  75 mg Oral Daily  . dextromethorphan-guaiFENesin  1 tablet Oral BID  . diltiazem  180 mg Oral Daily  . furosemide  40 mg Intravenous BID  . mouth rinse  15 mL Mouth Rinse BID  . metoprolol tartrate  100 mg Oral BID  . nicotine  21 mg Transdermal Daily  . omega-3 acid ethyl esters  1 g Oral Daily  . pantoprazole  40 mg Oral BID  . rivaroxaban  15 mg Oral Q supper   Continuous Infusions: . sodium chloride 10 mL (02/12/17 0209)   PRN Meds: sodium chloride, fentaNYL (SUBLIMAZE) injection, ipratropium, levalbuterol, ondansetron   Vital Signs    Vitals:   02/17/17 0409 02/17/17 1237 02/17/17 1944 02/18/17 0500  BP: (!) 142/84 108/73 140/73 135/69  Pulse: 87 97 62 64  Resp: 18 19 18 19   Temp: 99.2 F (37.3 C)  98.9 F (37.2 C) 98.4 F (36.9 C)  TempSrc: Oral  Oral Oral  SpO2: 99% 97% 98% 93%  Weight: 201 lb (91.2 kg)   185 lb 6.4 oz (84.1 kg)  Height:        Intake/Output Summary (Last 24 hours) at 02/18/17 1134 Last data filed at 02/18/17 0932  Gross per 24 hour  Intake              480 ml  Output             3050 ml  Net            -2570 ml   Filed Weights   02/16/17 0543 02/17/17 0409 02/18/17 0500  Weight: 200 lb 6.4 oz (90.9 kg) 201 lb (91.2 kg) 185 lb 6.4 oz (84.1 kg)    Telemetry    Atrial fibrillation,  rates 70-80 - Personally Reviewed  Physical Exam   GEN: Well nourished, well developed HEENT: normal  Neck: no JVD, carotid bruits, or masses Cardiac: + atrial fibrillation. no murmurs, rubs, or gallops,no edema.  Intact distal pulses bilaterally.  Respiratory: clear to auscultation bilaterally, normal work of breathing, becomes mildly SOB with long sentences.  GI: soft, nontender, nondistended, + BS MS: no deformity or atrophy  Skin: warm and dry, no rash Neuro: Alert and Oriented x 3, Strength and sensation are intact Psych:   Full affect  Labs    Chemistry  Recent Labs Lab 02/15/17 0646 02/16/17 0347 02/17/17 0249  NA 139 137 137  K 3.6 4.0 4.2  CL 106 105 102  CO2 26 24 28   GLUCOSE 93 113* 129*  BUN 22* 23* 19  CREATININE 1.02* 0.89 0.97  CALCIUM 8.0* 8.1* 8.5*  GFRNONAA 52* >60 56*  GFRAA >60 >60 >60  ANIONGAP 7 8 7      Hematology  Recent Labs Lab 02/16/17 0347 02/17/17 0249 02/18/17 0318  WBC 18.3* 17.5* 14.8*  RBC 4.61 4.56 4.19  HGB 12.3 12.1 11.3*  HCT 37.8 37.5 34.9*  MCV 82.0 82.2 83.3  MCH 26.7 26.5 27.0  MCHC 32.5 32.3 32.4  RDW 17.0* 17.0* 17.2*  PLT  245 246 240    Cardiac Enzymes  Recent Labs Lab 02/11/17 1236 02/11/17 1550 02/11/17 1851 02/12/17 0049  TROPONINI 0.21* 0.26* 0.28* 0.26*    No results for input(s): TROPIPOC in the last 168 hours.   BNP No results for input(s): BNP, PROBNP in the last 168 hours.   DDimer  No results for input(s): DDIMER in the last 168 hours.   Radiology    No results found.  Cardiac Studies   02/02/2017 Echocardiogram Study Conclusions  - Left ventricle: The cavity size was normal. Wall thickness was   increased in a pattern of mild LVH. Systolic function was mildly   reduced. The estimated ejection fraction was in the range of 45%   to 50%. There is hypokinesis of the basal-midinferior myocardium. - Aortic valve: Trileaflet; mildly thickened, mildly calcified   leaflets. - Mitral valve: There was moderate regurgitation. - Left atrium: The atrium was mildly dilated. - Right atrium: The atrium was mildly dilated. - Tricuspid valve: There was moderate regurgitation directed   eccentrically and  toward the septum. - Pulmonary arteries: Systolic pressure was moderately increased.   PA peak pressure: 52 mm Hg (S).  02/12/2017 Limited Echocardiogram Study Conclusions  - Left ventricle: The cavity size was normal. There was mild   concentric hypertrophy. Systolic function was moderately to   severely reduced. The estimated ejection fraction was in the   range of 30% to 35%. Moderate diffuse hypokinesis with distinct   regional wall motion abnormalities. Severe hypokinesis of the   basal-midinferolateral, inferior, and inferoseptal myocardium. - Mitral valve: There was mild to moderate regurgitation directed   centrally. - Left atrium: The atrium was moderately to severely dilated. - Right ventricle: The cavity size was mildly dilated. Systolic   function was moderately reduced. - Right atrium: The atrium was mildly dilated. - Tricuspid valve: There was moderate-severe regurgitation directed   centrally. - Pulmonary arteries: Systolic pressure was moderately increased.   PA peak pressure: 55 mm Hg (S).  Impressions:  - Image quality has deteriorated compared to February 02, 2017 study.   Nevertheless, overall LV systolic function appears a little worse   and there is more evident RV dysfunction.   Findings are consistent with extensive ischemia/nfarction in the   right coronary artery distribution, with RV involvement.    Patient Profile     RASCHELLE MANJARRES is a 76 y.o. female with a history of PVD with s/p multiple PTA procedures with right common iliac artery stent, left SFA stent, left SFA directional atherectomy 2013, abnormal nuclear stress test 07/2014 with mild anteroapical ischemia with no angina and persistent atrial fibrillation s/p DCCV 07/2016 on Xarelto with last dose last night  Assessment & Plan   1. Atrial fibrillation: HR now in 70-80 range on cardizem CD to 180 mg po daily. Started on Xarelto 15 mg po daily (per pharmacy [facility] trial), no bleeding  2.  CAD, recent inferior STEMI BMS to RCA on 01/30/17:  Holding aspirin, continue  plavix and Xarelto (7/25)  2. Acute on chronic combined systolic and diastolic CHF: LVEF 30-35%, Has a net loss of 6 liters this admission.  I would switch to 40 mg po daily at discharge, she wasn't on any diuretics prior to the admission.   4. GI bleed: GI managing  5. Renal insufficiency: Creatinine is 0.97.  Stable for a discharge today, we will arrange for a follow up.  Tobias Alexander, MD 02/18/2017

## 2017-02-19 ENCOUNTER — Ambulatory Visit: Payer: Medicare HMO | Admitting: Cardiology

## 2017-02-23 ENCOUNTER — Telehealth: Payer: Self-pay | Admitting: Cardiovascular Disease

## 2017-02-23 NOTE — Telephone Encounter (Signed)
New message    Pt husband is calling stating that when pt was in hospital she had a procedure in her stomach to see how much blood she was loosing. He states that she is now complaining of pain in her stomach. She is asking if she needs to go see a stomach doctor. Please call.

## 2017-02-23 NOTE — Telephone Encounter (Signed)
Spoke with patient and she does not see in visible blood or dark tarry stools. Patient stated her stomach is hurting but not like when she went to ED for abdominal pain/GI bleed. Advised patient to contact PCP since she has no gastroenterologist. Patient verbalized understanding.

## 2017-03-09 ENCOUNTER — Ambulatory Visit (INDEPENDENT_AMBULATORY_CARE_PROVIDER_SITE_OTHER): Payer: Medicare HMO | Admitting: Physician Assistant

## 2017-03-09 VITALS — BP 145/100 | HR 71 | Ht 66.0 in | Wt 178.0 lb

## 2017-03-09 DIAGNOSIS — E785 Hyperlipidemia, unspecified: Secondary | ICD-10-CM

## 2017-03-09 DIAGNOSIS — Z79899 Other long term (current) drug therapy: Secondary | ICD-10-CM

## 2017-03-09 DIAGNOSIS — I481 Persistent atrial fibrillation: Secondary | ICD-10-CM

## 2017-03-09 DIAGNOSIS — I739 Peripheral vascular disease, unspecified: Secondary | ICD-10-CM | POA: Diagnosis not present

## 2017-03-09 DIAGNOSIS — I10 Essential (primary) hypertension: Secondary | ICD-10-CM

## 2017-03-09 DIAGNOSIS — I2510 Atherosclerotic heart disease of native coronary artery without angina pectoris: Secondary | ICD-10-CM | POA: Diagnosis not present

## 2017-03-09 DIAGNOSIS — I4819 Other persistent atrial fibrillation: Secondary | ICD-10-CM

## 2017-03-09 MED ORDER — DILTIAZEM HCL ER COATED BEADS 240 MG PO CP24: 240.0000 mg | ORAL_CAPSULE | Freq: Every day | ORAL | 3 refills | Status: DC

## 2017-03-09 MED ORDER — DILTIAZEM HCL ER COATED BEADS 240 MG PO CP24
240.0000 mg | ORAL_CAPSULE | Freq: Every day | ORAL | 3 refills | Status: AC
Start: 2017-03-09 — End: ?

## 2017-03-09 NOTE — Progress Notes (Signed)
Cardiology Office Note    Date:  03/11/2017   ID:  Christy PUFAHL, DOB August 03, 1940, MRN [id number]  PCP:  Don Broach, [facility]  Cardiologist:  Dr. Allyson Sabal  Chief Complaint  Patient presents with  . Follow-up    seen for Dr. Allyson Sabal.    History of Present Illness:  Christy Campbell is a 76 y.o. female with PMH of PVD s/p R common iliac artery stent, L SFA stent, L SFA directional atherectomy 2013, persistent atrial fibrillation on Xarelto, HTN, HLD, chronic tobacco abuse and CAD. She had abnormal nuclear stress test in January 2016 with mild anteroapical ischemia and no angina. She had DCCV in January 2018 however was noted to be back in atrial fibrillation by therapy 2018 office visit. At which time her was decided to pursue rate control. Vascular Doppler obtained in May 2018 showed patent right common iliac artery stent, stable greater than 50% stenosis in the left common iliac artery, stable greater than 50% stenosis in bilateral external iliac arteries, new 50-99% stenosis in the right mid to distal SFA, status post angioplasty and patent left SFA stent.   She was admitted in 01/30/2017 for intermittent upper abdominal pain. The epigastric discomfort radiating into her neck. Initial EKG showed mild ST elevation in the inferior leads with reciprocal changes in lead 1 and aVL that was new from the previous EKG. Troponin of 3.2, code STEMI was initially called. Patient underwent emergent cardiac catheterization on the same day which showed three-vessel CAD with 90% focal proximal LAD, 85% segmental LAD following the first diagonal, 95% ostial OM1, 100% mid left circumflex and 100% mid RCA lesion. The 100% mid RCA lesion was felt to be the culprit lesion and treated with BMS. The plan is to continue DAPT for one month, and it was she will recover plan for CABG. She was initially on Brilinta, however due to the need for Xarelto, she was transitioned to Plavix. During that admission, she  also grew a urine culture of greater than 100,000 Escherichia coli, no treatment as patient was asymptomatic. He was instructed to follow-up with primary care provider if has any dysuria.  Patient returned to the hospital on 02/10/2017 with acute on chronic respiratory failure with hypoxia. She was noted to have elevated BNP of 2783. Elevated leukocytosis and elevated lactic acid. Chest x-ray showed left basilar infiltration. She was treated for pneumonia and acute CHF. She was also seen by GI for GI bleed. She underwent an endoscopy. Her Plavix was temporarily held during this admission and was restarted on 02/15/2017. Xarelto was reduced to 15 mg daily based on pharmacy PIONEER trial.   She presents today for follow-up, she denies any significant chest discomfort. She has been doing well at home. She does have some degree of fatigue. However she is still recovering from the recent hospitalization. Her breathing is much better. We plan to refer her to cardiothoracic surgery service for evaluation of bypass surgery. I have discussed her case with Dr. Allyson Sabal as well. She will need to hold her Plavix for 7 days and her Xarelto for 3 days prior to bypass surgery. Her heart rate is controlled on EKG, I plan to increase her diltiazem to 240 mg daily. She is tolerating the current dose of diuretic, I plan to obtain a basic metabolic panel.   Past Medical History:  Diagnosis Date  . Anemia    "long time ago"  . Arthritis    "back; right knee and ankle"  .  Dyslipidemia   . GERD (gastroesophageal reflux disease)   . H/O cardiovascular stress test 12/06/08   normal, no ischemia  . History of stomach ulcers 03/01/12   "little bitty ones"  . Hypertension    echo 12/06/08-EF>55%, mild AS and mild pulmonary htn  . Kidney stones   . PVD (peripheral vascular disease) (HCC) 12/29/2011   h/o right common iliac artery stent, left SFA stent and left SFA directional atherectomy 02/29/12  . Tobacco abuse    Quit  01/30/2017    Past Surgical History:  Procedure Laterality Date  . APPENDECTOMY    . ATHERECTOMY  03/01/2012  . ATHERECTOMY N/A 12/29/2011   Procedure: ATHERECTOMY;  Surgeon: Runell Gess, MD;  Location: [facility] CATH LAB;  Service: Cardiovascular;  Laterality: N/A;  . ATHERECTOMY N/A 03/01/2012   Procedure: ATHERECTOMY;  Surgeon: Runell Gess, MD;  Location: [facility] CATH LAB;  Service: Cardiovascular;  Laterality: N/A;  . CARDIOVERSION N/A 08/10/2016   Procedure: CARDIOVERSION;  Surgeon: Thurmon Fair, MD;  Location: [facility] ENDOSCOPY;  Service: Cardiovascular;  Laterality: N/A;  . CHOLECYSTECTOMY    . COLONOSCOPY    . CORONARY/GRAFT ACUTE MI REVASCULARIZATION N/A 01/30/2017   Procedure: Coronary/Graft Acute MI Revascularization;  Surgeon: Swaziland, Peter M, MD;  Location: [facility] INVASIVE CV LAB;  Service: Cardiovascular;  Laterality: N/A;  . ESOPHAGOGASTRODUODENOSCOPY (EGD) WITH PROPOFOL N/A 02/15/2017   Procedure: ESOPHAGOGASTRODUODENOSCOPY (EGD) WITH PROPOFOL;  Surgeon: Bernette Redbird, MD;  Location: [facility] ENDOSCOPY;  Service: Endoscopy;  Laterality: N/A;  Recent MI  . EYE SURGERY     Vitrectomy, and bilateral cataracts  . INGUINAL HERNIA REPAIR  1990's   right  . KNEE ARTHROSCOPY  1990's   bilaterally  . LASER PHOTO ABLATION Right 07/27/2013   Procedure: LASER PHOTO ABLATION;  Surgeon: Edmon Crape, MD;  Location: [facility] OR;  Service: Ophthalmology;  Laterality: Right;  . LEFT HEART CATH AND CORONARY ANGIOGRAPHY N/A 01/30/2017   Procedure: Left Heart Cath and Coronary Angiography;  Surgeon: Swaziland, Peter M, MD;  Location: [facility] INVASIVE CV LAB;  Service: Cardiovascular;  Laterality: N/A;  . PARS PLANA VITRECTOMY Right 07/27/2013   Procedure: PARS PLANA VITRECTOMY WITH 25 GAUGE;  Surgeon: Edmon Crape, MD;  Location: [facility] OR;  Service: Ophthalmology;  Laterality: Right;  . PERIPHERAL ARTERIAL STENT GRAFT  03/01/2012   successful directional atheretomy using turbo hawk of a focal prox left SFA lesion   . PV  angiogram  12/29/11   successful directinal atherectomy using the turbo hawk to righ SFA stenosis  . PV angiogram  04/17/10   successful PTA/stenting of the left superficial femoral artery and right iliac  . TUBAL LIGATION  1960's  . VAGINAL HYSTERECTOMY  1970's    Current Medications: Outpatient Medications Prior to Visit  Medication Sig Dispense Refill  . acetaminophen (TYLENOL) 325 MG tablet Take 325 mg by mouth 2 (two) times daily as needed. For pain    . atorvastatin (LIPITOR) 80 MG tablet Take 1 tablet (80 mg total) by mouth daily at 6 PM. 30 tablet 6  . clopidogrel (PLAVIX) 75 MG tablet Take 1 tablet (75 mg total) by mouth daily. 30 tablet 6  . furosemide (LASIX) 40 MG tablet Take 1 tablet (40 mg total) by mouth daily. 30 tablet 0  . HYDROcodone-homatropine (HYCODAN) 5-1.5 MG/5ML syrup Take 5 mLs by mouth every 6 (six) hours. 120 mL 0  . metoprolol tartrate (LOPRESSOR) 100 MG tablet Take 1 tablet (100 mg total) by mouth 2 (two) times daily. 60  tablet 6  . Multiple Vitamin (MULITIVITAMIN WITH MINERALS) TABS Take 1 tablet by mouth daily.    . nitroGLYCERIN (NITROSTAT) 0.4 MG SL tablet Place 1 tablet (0.4 mg total) under the tongue every 5 (five) minutes x 3 doses as needed for chest pain. 25 tablet 12  . Omega-3 Fatty Acids (FISH OIL PEARLS) 300 MG CAPS Take 300 mg by mouth daily.    . pantoprazole (PROTONIX) 40 MG tablet Take 1 tablet (40 mg total) by mouth 2 (two) times daily. 60 tablet 0  . promethazine (PHENERGAN) 12.5 MG tablet Take 12.5 mg by mouth every 6 (six) hours as needed for nausea or vomiting.    . Rivaroxaban (XARELTO) 15 MG TABS tablet Take 1 tablet (15 mg total) by mouth daily with supper. 30 tablet 0  . vitamin C (ASCORBIC ACID) 500 MG tablet Take 1,000 mg by mouth daily.     Marland Kitchen diltiazem (CARDIZEM CD) 180 MG 24 hr capsule Take 1 capsule (180 mg total) by mouth daily. 30 capsule 0   No facility-administered medications prior to visit.      Allergies:   Aspirin;  Tetanus toxoids; Phenergan [promethazine hcl]; Phenergan [promethazine]; Hydrocodone; Oxycodone; and Pravastatin   Social History   Social History  . Marital status: Married    Spouse name: N/A  . Number of children: N/A  . Years of education: N/A   Occupational History  . Retired    Social History Main Topics  . Smoking status: Former Smoker    Packs/day: 0.50    Years: 10.00    Types: Cigarettes    Quit date: 01/30/2017  . Smokeless tobacco: Never Used  . Alcohol use No  . Drug use: No  . Sexual activity: Yes   Other Topics Concern  . None   Social History Narrative   Pt lives with husband in [place], [place]     Family History:  The patient's family history includes CVA in her mother; Cancer in her mother; Pneumonia in her father.   ROS:   Please see the history of present illness.    ROS All other systems reviewed and are negative.   PHYSICAL EXAM:   VS:  BP (!) 145/100   Pulse 71   Ht 5\' 6"  (1.676 m)   Wt 178 lb (80.7 kg)   BMI 28.73 kg/m    GEN: Well nourished, well developed, in no acute distress  HEENT: normal  Neck: no JVD, carotid bruits, or masses Cardiac: Irregularly irregular; no murmurs, rubs, or gallops,no edema  Respiratory:  clear to auscultation bilaterally, normal work of breathing GI: soft, nontender, nondistended, + BS MS: no deformity or atrophy  Skin: warm and dry, no rash Neuro:  Alert and Oriented x 3, Strength and sensation are intact Psych: euthymic mood, full affect  Wt Readings from Last 3 Encounters:  03/11/17 178 lb (80.7 kg)  02/18/17 185 lb 6.4 oz (84.1 kg)  02/04/17 195 lb (88.5 kg)      Studies/Labs Reviewed:   EKG:  EKG is ordered today.  The ekg ordered today demonstrates Atrial fibrillation, T wave inversion in inferior leads  Recent Labs: 01/30/2017: ALT 87 01/31/2017: TSH 0.675 02/10/2017: B Natriuretic Peptide 2,783.2 02/17/2017: BUN 19; Creatinine, Ser 0.97; Magnesium 1.6; Potassium 4.2; Sodium 137 02/18/2017:  Hemoglobin 11.3; Platelets 240   Lipid Panel    Component Value Date/Time   CHOL 142 01/30/2017 2104   TRIG 80 01/30/2017 2104   HDL 41 01/30/2017 2104   CHOLHDL 3.5  01/30/2017 2104   VLDL 16 01/30/2017 2104   LDLCALC 85 01/30/2017 2104    Additional studies/ records that were reviewed today include:   Cath 01/30/2017 Conclusion     Ost LAD to Prox LAD lesion, 90 %stenosed.  Prox LAD to Mid LAD lesion, 85 %stenosed.  Prox Cx to Mid Cx lesion, 100 %stenosed.  Ost 1st Mrg to 1st Mrg lesion, 95 %stenosed.  There is mild left ventricular systolic dysfunction.  LV end diastolic pressure is normal.  The left ventricular ejection fraction is 45-50% by visual estimate.  A STENT VISION RX [id number] bare metal stent was successfully placed.  Mid RCA lesion, 100 %stenosed.  Post intervention, there is a 0% residual stenosis.   1. Severe 3 vessel obstructive CAD    - 90% focal proximal LAD    - 85% segmental LAD following the first diagonal    - 95% ostial first OM    - 100% mid LCx. Left to left collaterals to OM2 and OM3    - 100% mid RCA- this is the culprit lesion 2. Mild LV dysfunction with inferior wall motion abnormality. 3. Normal LVEDP 4. Successful stenting of the mid RCA with DES  Plan: would continue DAPT for one month. Resume IV heparin for Afib. May resume Xarelto tomorrow pm if no complications. Given advanced 3 vessel disease I would recommend CABG once she has recovered from her infarct in 4-6 weeks. Aggressive risk factor modification.       ASSESSMENT:    1. Coronary artery disease involving native coronary artery of native heart without angina pectoris   2. Encounter for long-term current use of medication   3. PAD (peripheral artery disease) (HCC)   4. Persistent atrial fibrillation (HCC)   5. Essential hypertension   6. Hyperlipidemia, unspecified hyperlipidemia type      PLAN:  In order of problems listed above:  1. CAD: s/p BMS to mid  RCA. (note despite cath report says DES, it is actually bare metal stent) she is currently on Plavix monotherapy due to the need for Xarelto for atrial fibrillation. She is one month out from the stent placement. She is we are still recovering from heart failure admission. However she is currently stable enough to proceed with bypass surgery. I will refer her to CT surgery service. She will need to hold her Plavix for 7 days prior to surgery and hold her Xarelto for 3 days prior to surgery.  2. Chronic atrial fibrillation on Xarelto: Heart rate could be better controlled, I will increase diltiazem to 240 mg daily  3. PAD: No significant LE pain unless severe exertion. Symptom well-controlled  4. Hypertension: Low pressure mildly elevated today, increasing diltiazem to 240 mg daily  5. Hyperlipidemia: Continue Lipitor 80 mg daily  6. Chronic systolic heart failure/ischemic cardiomyopathy with baseline EF 30-35%: Continue on current diuretic, volume status well-controlled.    Medication Adjustments/Labs and Tests Ordered: Current medicines are reviewed at length with the patient today.  Concerns regarding medicines are outlined above.  Medication changes, Labs and Tests ordered today are listed in the Patient Instructions below. Patient Instructions  Medication Instructions:  WHEN YOU FINISH YOUR CURRENT DOSE OF DILTIAZEM INCREASE YOUR DOSE TO 240 MG DAILY  Labwork: BMET SOON   Testing/Procedures: NONE  Follow-Up: Your physician recommends that you schedule a follow-up appointment in: 1 MONTH OV  You have been referred to TO CARDIAC SURGEON   If you need a refill on your cardiac medications before your  next appointment, please call your pharmacy.     Ramond Dial, [place]  03/11/2017 1:19 PM    [facility] [address] [place], [place], [place]  [address] Phone: [phone]; Fax: [phone]

## 2017-03-09 NOTE — Patient Instructions (Signed)
Medication Instructions:  WHEN YOU FINISH YOUR CURRENT DOSE OF DILTIAZEM INCREASE YOUR DOSE TO 240 MG DAILY  Labwork: BMET SOON   Testing/Procedures: NONE  Follow-Up: Your physician recommends that you schedule a follow-up appointment in: 1 MONTH OV  You have been referred to TO CARDIAC SURGEON   If you need a refill on your cardiac medications before your next appointment, please call your pharmacy.

## 2017-03-10 ENCOUNTER — Telehealth: Payer: Self-pay | Admitting: Physician Assistant

## 2017-03-10 NOTE — Telephone Encounter (Signed)
Advised patient, verbalized understanding  

## 2017-03-10 NOTE — Telephone Encounter (Signed)
Agree with PCP evaluation

## 2017-03-10 NOTE — Telephone Encounter (Signed)
Pt was seen yesterday by Christy Campbell.She is spotting this morning like her period is on,she wants to know if she should stop taking Plavix or what does she need to do? Please advise asap please.

## 2017-03-10 NOTE — Telephone Encounter (Signed)
S/w pt she was seen by Azalee Course, pa yesterday and she states that her spotting is 'letting up now' and she will call PCP and let her know.informed pt not to stop plavix or xarelto. Any further directions?

## 2017-03-11 ENCOUNTER — Encounter: Payer: Self-pay | Admitting: Physician Assistant

## 2017-03-11 ENCOUNTER — Other Ambulatory Visit: Payer: Self-pay | Admitting: *Deleted

## 2017-03-11 ENCOUNTER — Other Ambulatory Visit: Payer: Self-pay | Admitting: Family Medicine

## 2017-03-11 MED ORDER — RIVAROXABAN 20 MG PO TABS: 20.0000 mg | ORAL_TABLET | Freq: Every day | ORAL | 0 refills | Status: DC

## 2017-03-11 NOTE — Telephone Encounter (Signed)
Scr down to 0.97 from 2.25 while in acute care  75yo IBW 59.3  Will increase dose to 20mg  daily

## 2017-03-24 ENCOUNTER — Encounter: Payer: Medicare HMO | Admitting: Thoracic Surgery (Cardiothoracic Vascular Surgery)

## 2017-03-24 LAB — BASIC METABOLIC PANEL
BUN / CREAT RATIO: 21 (ref 12–28)
BUN: 25 mg/dL (ref 8–27)
CALCIUM: 9.6 mg/dL (ref 8.7–10.3)
CHLORIDE: 100 mmol/L (ref 96–106)
CO2: 25 mmol/L (ref 20–29)
Creatinine, Ser: 1.21 mg/dL — ABNORMAL HIGH (ref 0.57–1.00)
GFR calc non Af Amer: 44 mL/min/{1.73_m2} — ABNORMAL LOW (ref >59)
GFR, EST AFRICAN AMERICAN: 51 mL/min/{1.73_m2} — AB (ref >59)
GLUCOSE: 140 mg/dL — AB (ref 65–99)
Potassium: 3.9 mmol/L (ref 3.5–5.2)
Sodium: 144 mmol/L (ref 134–144)

## 2017-03-25 ENCOUNTER — Telehealth: Payer: Self-pay | Admitting: *Deleted

## 2017-03-25 ENCOUNTER — Institutional Professional Consult (permissible substitution) (INDEPENDENT_AMBULATORY_CARE_PROVIDER_SITE_OTHER): Payer: Medicare HMO | Admitting: Thoracic Surgery (Cardiothoracic Vascular Surgery)

## 2017-03-25 ENCOUNTER — Encounter: Payer: Self-pay | Admitting: Thoracic Surgery (Cardiothoracic Vascular Surgery)

## 2017-03-25 VITALS — BP 138/82 | HR 70 | Resp 16 | Ht 66.0 in | Wt 181.0 lb

## 2017-03-25 DIAGNOSIS — I361 Nonrheumatic tricuspid (valve) insufficiency: Secondary | ICD-10-CM

## 2017-03-25 DIAGNOSIS — I2510 Atherosclerotic heart disease of native coronary artery without angina pectoris: Secondary | ICD-10-CM

## 2017-03-25 DIAGNOSIS — I481 Persistent atrial fibrillation: Secondary | ICD-10-CM

## 2017-03-25 DIAGNOSIS — I739 Peripheral vascular disease, unspecified: Secondary | ICD-10-CM

## 2017-03-25 DIAGNOSIS — I340 Nonrheumatic mitral (valve) insufficiency: Secondary | ICD-10-CM

## 2017-03-25 DIAGNOSIS — Z7901 Long term (current) use of anticoagulants: Secondary | ICD-10-CM | POA: Diagnosis not present

## 2017-03-25 DIAGNOSIS — I4819 Other persistent atrial fibrillation: Secondary | ICD-10-CM

## 2017-03-25 MED ORDER — FUROSEMIDE 20 MG PO TABS: 20.0000 mg | ORAL_TABLET | Freq: Every day | ORAL | 1 refills | Status: DC

## 2017-03-25 NOTE — Telephone Encounter (Signed)
-----   Message from [place], [place] sent at 03/24/2017  5:47 PM EDT ----- Renal function worsened slightly, decrease lasix to 20mg  daily.

## 2017-03-25 NOTE — Progress Notes (Signed)
PCP is Pc, [facility] Referring Provider is Azalee Course, PA/ BERRY  Chief Complaint  Patient presents with  . Coronary Artery Disease    Surgical eval, Cardiac Cath 01/30/17, ECHO 02/02/17 and 02/11/17    HPI:76 year old woman sent for consultation regarding possible bypass grafting.  Mrs. Foisy to 76 year old woman with a history of hypertension, hyperlipidemia, tobacco abuse, peripheral vascular disease, arthritis, recent GI bleed, GERD and obesity. She presented with "light" heartburn on 01/30/2017. She was having a ST elevation MI. She underwent emergent catheterization where she was found to have a totally occluded right coronary. She had significant disease in the left coronary system with an 90% LAD stenosis, a 95% stenosis in OM1 and total occlusion of the circumflex before OM 2 and 3 which filled via left to left collaterals. She had PTCA and stenting of the right coronary with a good result.  She was readmitted on 02/10/2017 with heart failure and a gastrointestinal bleed. Endoscopy showed no source of the GI bleed.  Her atrial fibrillation dates back to February of this year. She did have cardioversion at one point but went back into atrial fibrillation shortly thereafter. She has been managed with rate control and anticoagulation.  She complains of shortness of breath with exertion and says that her right leg gets tired when she walks about 10-15 minutes. It improves with rest.  Past Medical History:  Diagnosis Date  . Anemia    "long time ago"  . Arthritis    "back; right knee and ankle"  . Dyslipidemia   . GERD (gastroesophageal reflux disease)   . H/O cardiovascular stress test 12/06/08   normal, no ischemia  . History of stomach ulcers 03/01/12   "little bitty ones"  . Hypertension    echo 12/06/08-EF>55%, mild AS and mild pulmonary htn  . Kidney stones   . PVD (peripheral vascular disease) (HCC) 12/29/2011   h/o right common iliac artery stent, left SFA  stent and left SFA directional atherectomy 02/29/12  . Tobacco abuse    Quit 01/30/2017    Past Surgical History:  Procedure Laterality Date  . APPENDECTOMY    . ATHERECTOMY  03/01/2012  . ATHERECTOMY N/A 12/29/2011   Procedure: ATHERECTOMY;  Surgeon: Runell Gess, MD;  Location[facility] CATH LAB;  Service: Cardiovascular;  Laterality: N/A;  . ATHERECTOMY N/A 03/01/2012   Procedure: ATHERECTOMY;  Surgeon: Runell Gess, MD;  Location: [facility] CATH LAB;  Service: Cardiovascular;  Laterality: N/A;  . CARDIOVERSION N/A 08/10/2016   Procedure: CARDIOVERSION;  Surgeon: Thurmon Fair, MD;  Location: [facility] ENDOSCOPY;  Service: Cardiovascular;  Laterality: N/A;  . CHOLECYSTECTOMY    . COLONOSCOPY    . CORONARY/GRAFT ACUTE MI REVASCULARIZATION N/A 01/30/2017   Procedure: Coronary/Graft Acute MI Revascularization;  Surgeon: Swaziland, Peter M, MD;  Location: [facility] INVASIVE CV LAB;  Service: Cardiovascular;  Laterality: N/A;  . ESOPHAGOGASTRODUODENOSCOPY (EGD) WITH PROPOFOL N/A 02/15/2017   Procedure: ESOPHAGOGASTRODUODENOSCOPY (EGD) WITH PROPOFOL;  Surgeon: Bernette Redbird, MD;  Location: [facility] ENDOSCOPY;  Service: Endoscopy;  Laterality: N/A;  Recent MI  . EYE SURGERY     Vitrectomy, and bilateral cataracts  . INGUINAL HERNIA REPAIR  1990's   right  . KNEE ARTHROSCOPY  1990's   bilaterally  . LASER PHOTO ABLATION Right 07/27/2013   Procedure: LASER PHOTO ABLATION;  Surgeon: Edmon Crape, MD;  Location: [facility] OR;  Service: Ophthalmology;  Laterality: Right;  . LEFT HEART CATH AND CORONARY ANGIOGRAPHY N/A 01/30/2017   Procedure: Left Heart Cath  and Coronary Angiography;  Surgeon: Swaziland, Peter M, MD;  Location: [facility] INVASIVE CV LAB;  Service: Cardiovascular;  Laterality: N/A;  . PARS PLANA VITRECTOMY Right 07/27/2013   Procedure: PARS PLANA VITRECTOMY WITH 25 GAUGE;  Surgeon: Edmon Crape, MD;  Location: [facility] OR;  Service: Ophthalmology;  Laterality: Right;  . PERIPHERAL ARTERIAL STENT GRAFT  03/01/2012   successful  directional atheretomy using turbo hawk of a focal prox left SFA lesion   . PV angiogram  12/29/11   successful directinal atherectomy using the turbo hawk to righ SFA stenosis  . PV angiogram  04/17/10   successful PTA/stenting of the left superficial femoral artery and right iliac  . TUBAL LIGATION  1960's  . VAGINAL HYSTERECTOMY  1970's    Family History  Problem Relation Age of Onset  . CVA Mother   . Cancer Mother   . Pneumonia Father     Social History Social History  Substance Use Topics  . Smoking status: Former Smoker    Packs/day: 0.50    Years: 10.00    Types: Cigarettes    Quit date: 01/30/2017  . Smokeless tobacco: Never Used  . Alcohol use No    Current Outpatient Prescriptions  Medication Sig Dispense Refill  . acetaminophen (TYLENOL) 325 MG tablet Take 325 mg by mouth 2 (two) times daily as needed. For pain    . atorvastatin (LIPITOR) 80 MG tablet Take 1 tablet (80 mg total) by mouth daily at 6 PM. 30 tablet 6  . clopidogrel (PLAVIX) 75 MG tablet Take 1 tablet (75 mg total) by mouth daily. 30 tablet 6  . diltiazem (CARDIZEM CD) 240 MG 24 hr capsule Take 1 capsule (240 mg total) by mouth daily. 90 capsule 3  . furosemide (LASIX) 20 MG tablet Take 1 tablet (20 mg total) by mouth daily. Note dose change. 90 tablet 1  . HYDROcodone-homatropine (HYCODAN) 5-1.5 MG/5ML syrup Take 5 mLs by mouth every 6 (six) hours. 120 mL 0  . metoprolol tartrate (LOPRESSOR) 100 MG tablet Take 1 tablet (100 mg total) by mouth 2 (two) times daily. 60 tablet 6  . Multiple Vitamin (MULITIVITAMIN WITH MINERALS) TABS Take 1 tablet by mouth daily.    . nitroGLYCERIN (NITROSTAT) 0.4 MG SL tablet Place 1 tablet (0.4 mg total) under the tongue every 5 (five) minutes x 3 doses as needed for chest pain. 25 tablet 12  . Omega-3 Fatty Acids (FISH OIL PEARLS) 300 MG CAPS Take 300 mg by mouth daily.    . pantoprazole (PROTONIX) 40 MG tablet Take 1 tablet (40 mg total) by mouth 2 (two) times daily. 60  tablet 0  . Rivaroxaban (XARELTO) 20 MG TABS tablet Take 1 tablet (20 mg total) by mouth daily with supper. Note dose change. 90 tablet 0  . vitamin C (ASCORBIC ACID) 500 MG tablet Take 1,000 mg by mouth daily.      No current facility-administered medications for this visit.     Allergies  Allergen Reactions  . Aspirin Nausea And Vomiting and Other (See Comments)    "pain; hurting"  . Phenergan [Promethazine Hcl] Other (See Comments)    Severe confusion  . Tetanus Toxoids Anaphylaxis and Swelling  . Phenergan [Promethazine] Other (See Comments)    Severe confusion  . Hydrocodone Nausea Only  . Oxycodone Nausea Only  . Pravastatin     myalgia    Review of Systems  Constitutional: Negative for activity change, fatigue and unexpected weight change.  HENT: Negative for trouble  swallowing and voice change.   Eyes: Negative for visual disturbance.  Respiratory: Positive for shortness of breath. Negative for cough.   Cardiovascular: Positive for chest pain (None since MI).  Gastrointestinal: Positive for abdominal pain and blood in stool.  Genitourinary: Negative for difficulty urinating and dysuria.  Musculoskeletal: Positive for arthralgias and joint swelling.  Neurological: Negative for syncope and weakness.  Hematological: Negative for adenopathy. Bruises/bleeds easily.  All other systems reviewed and are negative.   BP 138/82 (BP Location: Right Arm, Patient Position: Sitting, Cuff Size: Large)   Pulse 70   Resp 16   Ht  (1.676 m)   Wt 181 lb (82.1 kg)   SpO2 95% Comment: ON RA  BMI 29.21 kg/m  Physical Exam  Constitutional: She is oriented to person, place, and time. No distress.  Obese  HENT:  Head: Normocephalic and atraumatic.  Mouth/Throat: No oropharyngeal exudate.  Eyes: Pupils are equal, round, and reactive to light. Conjunctivae and EOM are normal. No scleral icterus.  Neck: Neck supple. No thyromegaly present.  Cardiovascular: Normal heart sounds and  intact distal pulses.  Exam reveals no gallop and no friction rub.   No murmur heard. Irregularly irregular  Pulmonary/Chest: Effort normal. No respiratory distress. She has no wheezes. She has no rales.  Abdominal: Soft. She exhibits no distension. There is no tenderness.  Musculoskeletal: She exhibits edema.  Lymphadenopathy:    She has no cervical adenopathy.  Neurological: She is alert and oriented to person, place, and time. No cranial nerve deficit.  Motor grossly intact  Skin: Skin is warm and dry.  Multiple varicosities both lower extremities  Vitals reviewed.    Diagnostic Tests: Cardiac catheterization Conclusion     Ost LAD to Prox LAD lesion, 90 %stenosed.  Prox LAD to Mid LAD lesion, 85 %stenosed.  Prox Cx to Mid Cx lesion, 100 %stenosed.  Ost 1st Mrg to 1st Mrg lesion, 95 %stenosed.  There is mild left ventricular systolic dysfunction.  LV end diastolic pressure is normal.  The left ventricular ejection fraction is 45-50% by visual estimate.  A STENT VISION RX [id number] bare metal stent was successfully placed.  Mid RCA lesion, 100 %stenosed.  Post intervention, there is a 0% residual stenosis.   1. Severe 3 vessel obstructive CAD    - 90% focal proximal LAD    - 85% segmental LAD following the first diagonal    - 95% ostial first OM    - 100% mid LCx. Left to left collaterals to OM2 and OM3    - 100% mid RCA- this is the culprit lesion 2. Mild LV dysfunction with inferior wall motion abnormality. 3. Normal LVEDP 4. Successful stenting of the mid RCA with DES  Plan: would continue DAPT for one month. Resume IV heparin for Afib. May resume Xarelto tomorrow pm if no complications. Given advanced 3 vessel disease I would recommend CABG once she has recovered from her infarct in 4-6 weeks. Aggressive risk factor modification.    Echocardiogram Study Conclusions  - Left ventricle: The cavity size was normal. There was mild   concentric hypertrophy.  Systolic function was moderately to   severely reduced. The estimated ejection fraction was in the   range of 30% to 35%. Moderate diffuse hypokinesis with distinct   regional wall motion abnormalities. Severe hypokinesis of the   basal-midinferolateral, inferior, and inferoseptal myocardium. - Mitral valve: There was mild to moderate regurgitation directed   centrally. - Left atrium: The atrium was moderately to  severely dilated. - Right ventricle: The cavity size was mildly dilated. Systolic   function was moderately reduced. - Right atrium: The atrium was mildly dilated. - Tricuspid valve: There was moderate-severe regurgitation directed   centrally. - Pulmonary arteries: Systolic pressure was moderately increased.   PA peak pressure: 55 mm Hg (S).  Impressions:  - Image quality has deteriorated compared to February 02, 2017 study.   Nevertheless, overall LV systolic function appears a little worse   and there is more evident RV dysfunction.   Findings are consistent with extensive ischemia/nfarction in the   right coronary artery distribution, with RV involvement.  I personally reviewed the cardiac catheterization and echocardiographic images and concur with the findings noted above.  Impression: Mrs. Bejarano 76 year old woman with multiple cardiac risk factors who presented with an ST elevation MI in mid July. She underwent emergent PTCA and stenting of her right coronary with a good angiographic result. She has residual significant disease in the LAD and circumflex. Circumflex disease in particular is not amenable to percutaneous intervention.  The ideal treatment would be coronary bypass grafting. Unfortunately she does not appear to have any usable saphenous vein. I am going to do vein mapping to better assess that. She could potentially be a candidate for use of a left radial or possibly bilateral mammaries. Her Allen's test on the left side is equivocal so we'll see what the  vascular studies show on that as well.  My plan is to do vein mapping and vascular lab studies and see her back in a week to further discuss interval CABG.  Atrial fibrillation- currently managed with rate control and anticoagulation. I think if she does undergo coronary bypass grafting consideration also should be given to doing a Maze procedure. She has already had an admission where she had gastrointestinal bleeding on Xarelto and that could potentially a problem for the future. If we can get her to maintain sinus rhythm and are able to stop the anticoagulation it could be beneficial to her.  Mitral regurgitation and tricuspid regurgitation - both will need to be assessed intraoperatively with TEE.   Peripheral arterial disease- she has had stents in both SFAs in the past. She has claudication symptoms in the right leg currently.   Tobacco abuse- she quit smoking when she came in with the MI.   Plan: Vascular lab studies and vein mapping  Return to the office next week.  Loreli Slot, MD [facility] [phone]

## 2017-03-25 NOTE — Telephone Encounter (Signed)
Results and recommendations discussed with patient, who verbalized understanding and thanks. She will plan to follow up as scheduled for next OV with Dr. Allyson Sabal, & call if new concerns in interim.

## 2017-03-26 ENCOUNTER — Other Ambulatory Visit: Payer: Self-pay | Admitting: *Deleted

## 2017-03-26 DIAGNOSIS — I739 Peripheral vascular disease, unspecified: Secondary | ICD-10-CM

## 2017-03-26 DIAGNOSIS — Z01818 Encounter for other preprocedural examination: Secondary | ICD-10-CM

## 2017-03-29 ENCOUNTER — Encounter (HOSPITAL_COMMUNITY): Payer: Medicare HMO

## 2017-03-30 ENCOUNTER — Ambulatory Visit (HOSPITAL_BASED_OUTPATIENT_CLINIC_OR_DEPARTMENT_OTHER)
Admission: RE | Admit: 2017-03-30 | Discharge: 2017-03-30 | Disposition: A | Discharge status: Home / Self Care | Payer: Medicare HMO | Source: Ambulatory Visit | Attending: Thoracic Surgery (Cardiothoracic Vascular Surgery) | Admitting: Thoracic Surgery (Cardiothoracic Vascular Surgery)

## 2017-03-30 ENCOUNTER — Other Ambulatory Visit: Payer: Self-pay

## 2017-03-30 ENCOUNTER — Ambulatory Visit (HOSPITAL_COMMUNITY)
Admission: RE | Admit: 2017-03-30 | Discharge: 2017-03-30 | Disposition: A | Discharge status: Home / Self Care | Payer: Medicare HMO | Source: Ambulatory Visit | Attending: Thoracic Surgery (Cardiothoracic Vascular Surgery) | Admitting: Thoracic Surgery (Cardiothoracic Vascular Surgery)

## 2017-03-30 DIAGNOSIS — I739 Peripheral vascular disease, unspecified: Secondary | ICD-10-CM | POA: Insufficient documentation

## 2017-03-30 DIAGNOSIS — Z01818 Encounter for other preprocedural examination: Secondary | ICD-10-CM | POA: Diagnosis not present

## 2017-03-30 LAB — VAS US DOPPLER PRE CABG
LCCADSYS: -90 cm/s
LEFT ECA DIAS: -11 cm/s
LEFT VERTEBRAL DIAS: -10 cm/s
Left CCA dist dias: -22 cm/s
Left CCA prox dias: 14 cm/s
Left CCA prox sys: 57 cm/s
Left ICA dist dias: -15 cm/s
Left ICA dist sys: -39 cm/s
Left ICA prox dias: -21 cm/s
Left ICA prox sys: -65 cm/s
RCCADSYS: 60 cm/s
RCCAPDIAS: 17 cm/s
RCCAPSYS: 60 cm/s
RIGHT ECA DIAS: -7 cm/s
RIGHT VERTEBRAL DIAS: -13 cm/s

## 2017-03-30 MED ORDER — FUROSEMIDE 20 MG PO TABS: 20.0000 mg | ORAL_TABLET | Freq: Every day | ORAL | 1 refills | Status: AC

## 2017-03-30 NOTE — Progress Notes (Signed)
Pre-op Cardiac Surgery  Carotid Findings:   Findings consistent with less than 39 percent stenosis involving the right internal carotid artery and the left internal carotid artery. The vertebral arteries demonstrate antegrade flow.  Upper Extremity Right Left  Brachial Pressures 167  Triphasic 170  Triphasic  Radial Waveforms Triphasic Triphasic  Ulnar Waveforms Triphasic Triphasic  Palmar Arch (Allen's Test) Palmar waveforms are obliterated with radial and ulnar compression. Palmar waveforms are obliterated with radial compression and are diminished greater than fifty percent with ulnar compression.   Lower  Extremity Right Left  Dorsalis Pedis 146 135  Posterior Tibial 145 161  Ankle/Brachial Indices 0.86 0.95   Findings:   Right ABI of 0.86 is suggestive of mild arterial occlusive disease at rest. Left ABI of 0.95 is suggestive of arterial flow within normal limits at rest.   Bilateral Lower Extremity Vein Map  Right Great Saphenous Vein  Segment Diameter Comment  1. Origin 4.5 mm   2. High Thigh 3.9 mm Branch  3. Mid Thigh 3.7 mm Branch  4. Low Thigh 2.8 mm Branch  5. At Knee 2.4 mm Branch  6. High Calf 2.6 mm   7. Low Calf 2.4 mm Branch  8. Ankle 2.3 mm Branch   Left Great Saphenous Vein  Segment Diameter Comment  1. Origin 4.7 mm Branch  2. High Thigh 4.3 mm Branch  3. Mid Thigh 3.1 mm Branch  4. Low Thigh 1.7 mm   5. At Knee 2.3 mm   6. High Calf 1.7 mm   7. Low Calf 1.8 mm Branch  8. Ankle 1.9 mm    03/30/17 3:47 PM Olen Cordial RVT

## 2017-03-31 ENCOUNTER — Telehealth: Payer: Self-pay | Admitting: Cardiovascular Disease

## 2017-03-31 NOTE — Telephone Encounter (Signed)
error 

## 2017-04-06 ENCOUNTER — Other Ambulatory Visit: Payer: Self-pay | Admitting: *Deleted

## 2017-04-06 ENCOUNTER — Ambulatory Visit (INDEPENDENT_AMBULATORY_CARE_PROVIDER_SITE_OTHER): Payer: Medicare HMO | Admitting: Thoracic Surgery (Cardiothoracic Vascular Surgery)

## 2017-04-06 VITALS — BP 160/95 | HR 65 | Resp 20 | Ht 66.0 in | Wt 182.0 lb

## 2017-04-06 DIAGNOSIS — I4819 Other persistent atrial fibrillation: Secondary | ICD-10-CM

## 2017-04-06 DIAGNOSIS — Z7901 Long term (current) use of anticoagulants: Secondary | ICD-10-CM

## 2017-04-06 DIAGNOSIS — I340 Nonrheumatic mitral (valve) insufficiency: Secondary | ICD-10-CM

## 2017-04-06 DIAGNOSIS — I2510 Atherosclerotic heart disease of native coronary artery without angina pectoris: Secondary | ICD-10-CM

## 2017-04-06 DIAGNOSIS — I361 Nonrheumatic tricuspid (valve) insufficiency: Secondary | ICD-10-CM

## 2017-04-06 DIAGNOSIS — I481 Persistent atrial fibrillation: Secondary | ICD-10-CM

## 2017-04-06 DIAGNOSIS — I739 Peripheral vascular disease, unspecified: Secondary | ICD-10-CM

## 2017-04-06 NOTE — Patient Instructions (Signed)
Stop Plavix (clopidogrel) after your dose on September 26th  Stop Xarelto after your dose on September 28th

## 2017-04-06 NOTE — Progress Notes (Signed)
[address]       Christy Campbell [address]             [phone]    HPI: Christy Campbell returns today to discuss possible surgery.  03/25/2017 Christy Campbell to 76 year old woman with a history of hypertension, hyperlipidemia, tobacco abuse, peripheral vascular disease, arthritis, recent GI bleed, GERD and obesity. She presented with "light" heartburn on 01/30/2017. She was having a ST elevation MI. She underwent emergent catheterization where she was found to have a totally occluded right coronary. She had significant disease in the left coronary system with an 90% LAD stenosis, a 95% stenosis in OM1 and total occlusion of the circumflex before OM 2 and 3 which filled via left to left collaterals. She had PTCA and stenting of the right coronary with a good result.  She was readmitted on 02/10/2017 with heart failure and a gastrointestinal bleed. Endoscopy showed no source of the GI bleed.  Her atrial fibrillation dates back to February of this year. She did have cardioversion at one point but went back into atrial fibrillation shortly thereafter. She has been managed with rate control and anticoagulation.  She complains of shortness of breath with exertion and says that her right leg gets tired when she walks about 10-15 minutes. It improves with rest.  04/06/2017 She has had her preoperative vascular evaluation. Her symptoms are unchanged in the interim   Past Medical History:  Diagnosis Date  . Anemia    "long time ago"  . Arthritis    "back; right knee and ankle"  . Dyslipidemia   . GERD (gastroesophageal reflux disease)   . H/O cardiovascular stress test 12/06/08   normal, no ischemia  . History of stomach ulcers 03/01/12   "little bitty ones"  . Hypertension    echo 12/06/08-EF>55%, mild AS and mild pulmonary htn  . Kidney stones   . PVD (peripheral vascular disease) (HCC) 12/29/2011   h/o right common iliac artery stent, left SFA stent and left SFA  directional atherectomy 02/29/12  . Tobacco abuse    Quit 01/30/2017    Current Outpatient Prescriptions  Medication Sig Dispense Refill  . acetaminophen (TYLENOL) 325 MG tablet Take 325 mg by mouth 2 (two) times daily as needed. For pain    . atorvastatin (LIPITOR) 80 MG tablet Take 1 tablet (80 mg total) by mouth daily at 6 PM. 30 tablet 6  . clopidogrel (PLAVIX) 75 MG tablet Take 1 tablet (75 mg total) by mouth daily. 30 tablet 6  . diltiazem (CARDIZEM CD) 240 MG 24 hr capsule Take 1 capsule (240 mg total) by mouth daily. 90 capsule 3  . furosemide (LASIX) 20 MG tablet Take 1 tablet (20 mg total) by mouth daily. Note dose change. 90 tablet 1  . HYDROcodone-homatropine (HYCODAN) 5-1.5 MG/5ML syrup Take 5 mLs by mouth every 6 (six) hours. 120 mL 0  . metoprolol tartrate (LOPRESSOR) 100 MG tablet Take 1 tablet (100 mg total) by mouth 2 (two) times daily. 60 tablet 6  . Multiple Vitamin (MULITIVITAMIN WITH MINERALS) TABS Take 1 tablet by mouth daily.    . nitroGLYCERIN (NITROSTAT) 0.4 MG SL tablet Place 1 tablet (0.4 mg total) under the tongue every 5 (five) minutes x 3 doses as needed for chest pain. 25 tablet 12  . Omega-3 Fatty Acids (FISH OIL PEARLS) 300 MG CAPS Take 300 mg by mouth daily.    . pantoprazole (PROTONIX) 40 MG tablet Take 1 tablet (40  mg total) by mouth 2 (two) times daily. 60 tablet 0  . Rivaroxaban (XARELTO) 20 MG TABS tablet Take 1 tablet (20 mg total) by mouth daily with supper. Note dose change. 90 tablet 0  . vitamin C (ASCORBIC ACID) 500 MG tablet Take 1,000 mg by mouth daily.      No current facility-administered medications for this visit.     Physical Exam BP (!) 160/95   Pulse 65   Resp 20   Ht  (1.676 m)   Wt 182 lb (82.6 kg)   SpO2 98% Comment: RA  BMI 29.38 kg/m  Obese 76 year old woman in no acute distress Alert and oriented 3 with no focal deficits Cardiac irregularly irregular, no murmur Lungs diminished at bases no rales or  wheezing Extremities multiple varicosities, no edema  Diagnostic Tests: Echocardiogram 02/11/2017 Study Conclusions  - Left ventricle: The cavity size was normal. There was mild   concentric hypertrophy. Systolic function was moderately to   severely reduced. The estimated ejection fraction was in the   range of 30% to 35%. Moderate diffuse hypokinesis with distinct   regional wall motion abnormalities. Severe hypokinesis of the   basal-midinferolateral, inferior, and inferoseptal myocardium. - Mitral valve: There was mild to moderate regurgitation directed   centrally. - Left atrium: The atrium was moderately to severely dilated. - Right ventricle: The cavity size was mildly dilated. Systolic   function was moderately reduced. - Right atrium: The atrium was mildly dilated. - Tricuspid valve: There was moderate-severe regurgitation directed   centrally. - Pulmonary arteries: Systolic pressure was moderately increased.   PA peak pressure: 55 mm Hg (S).  Impressions:  - Image quality has deteriorated compared to February 02, 2017 study.   Nevertheless, overall LV systolic function appears a little worse   and there is more evident RV dysfunction.   Findings are consistent with extensive ischemia/nfarction in the   right coronary artery distribution, with RV involvement.  Cardiac catheterization 01/30/2017 Conclusion     Ost LAD to Prox LAD lesion, 90 %stenosed.  Prox LAD to Mid LAD lesion, 85 %stenosed.  Prox Cx to Mid Cx lesion, 100 %stenosed.  Ost 1st Mrg to 1st Mrg lesion, 95 %stenosed.  There is mild left ventricular systolic dysfunction.  LV end diastolic pressure is normal.  The left ventricular ejection fraction is 45-50% by visual estimate.  A STENT VISION RX [id number] bare metal stent was successfully placed.  Mid RCA lesion, 100 %stenosed.  Post intervention, there is a 0% residual stenosis.   1. Severe 3 vessel obstructive CAD    - 90% focal proximal  LAD    - 85% segmental LAD following the first diagonal    - 95% ostial first OM    - 100% mid LCx. Left to left collaterals to OM2 and OM3    - 100% mid RCA- this is the culprit lesion 2. Mild LV dysfunction with inferior wall motion abnormality. 3. Normal LVEDP 4. Successful stenting of the mid RCA with DES  Plan: would continue DAPT for one month. Resume IV heparin for Afib. May resume Xarelto tomorrow pm if no complications. Given advanced 3 vessel disease I would recommend CABG once she has recovered from her infarct in 4-6 weeks. Aggressive risk factor modification.   Noninvasive vascular studies 03/30/2017 Carotid Findings:   Findings consistent with less than 39 percent stenosis involving the right internal carotid artery and the left internal carotid artery. The vertebral arteries demonstrate antegrade flow.  Upper Extremity  Right Left  Brachial Pressures 167  Triphasic 170  Triphasic  Radial Waveforms Triphasic Triphasic  Ulnar Waveforms Triphasic Triphasic  Palmar Arch (Allen's Test) Palmar waveforms are obliterated with radial and ulnar compression. Palmar waveforms are obliterated with radial compression and are diminished greater than fifty percent with ulnar compression.   Lower  Extremity Right Left  Dorsalis Pedis 146 135  Posterior Tibial 145 161  Ankle/Brachial Indices 0.86 0.95   Findings:   Right ABI of 0.86 is suggestive of mild arterial occlusive disease at rest. Left ABI of 0.95 is suggestive of arterial flow within normal limits at rest.   Bilateral Lower Extremity Vein Map  Right Great Saphenous Vein  Segment Diameter Comment  1. Origin 4.5 mm   2. High Thigh 3.9 mm Branch  3. Mid Thigh 3.7 mm Branch  4. Low Thigh 2.8 mm Branch  5. At Knee 2.4 mm Branch  6. High Calf 2.6 mm   7. Low Calf 2.4 mm Branch  8. Ankle 2.3 mm Branch   Left Great Saphenous Vein  Segment Diameter Comment  1. Origin 4.7 mm Branch  2. High Thigh 4.3 mm Branch   3. Mid Thigh 3.1 mm Branch  4. Low Thigh 1.7 mm   5. At Knee 2.3 mm   6. High Calf 1.7 mm   7. Low Calf 1.8 mm Branch  8. Ankle 1.9 mm    03/30/17 3:47 PM  I personally reviewed the catheterization and echocardiographic images and concur with the findings noted above  Impression: Christy Campbell 76 year old woman with three-vessel coronary disease status post PTCA and stenting of her right coronary after presenting with an ST elevation MI in July. Her right coronary was essentially angiographically normal post stent. She has residual severe disease in the LAD and circumflex distributions that is not amenable to percutaneous intervention. She continues to have shortness of breath with exertion but is not having any of the vague chest discomfort she had when she presented with her MI. I suspect there is an anginal component to her shortness of breath, but there certainly are other factors that may be contributing including tobacco abuse and morbid obesity. She did quit smoking when she had her MI.  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 She's not diagnosed with diabetes so potentially we can use bilateral mammaries. Pain mapping does indicate there may be some usable vein although we may have to harvest both eyes 2 obtain enough vein to complete the operation.  I do think the benefit of coronary bypass grafting outweighs the risk and recommend that we proceed. They do understand there is a higher than normal risk of incomplete revascularization.  We also discussed doing a Maze procedure for atrial fibrillation at the time of surgery. She has had issues with GI bleeding and could ultimately have problems with maintaining anticoagulation. She understands that Maze procedure has about an 85% effectiveness and there is no guarantee we can completely eliminate her atrial fibrillation.  We also discussed the need to reevaluate the mitral and  tricuspid valves with intraoperative transesophageal echocardiography. One or both of those valves may need repair.  I again reviewed the general nature of the procedure including the incisions to be used, the need for cardiopulmonary bypass, use of drainage tubes postoperatively, the incisions to be used, the expected hospital stay, and the overall recovery, which may be prolonged. They understand the general rate some success with these procedures. I reviewed  the indications, risks, benefits, and alternatives. They understand the risks include, but are not limited to death, MI, DVT, PE, stroke, bleeding, possible need for transfusion, infection, complete heart block requiring pacemaker, cardiac arrhythmias, respiratory or renal failure, as well as the possibility of other unforeseeable complications.  She accepts the risk and wishes to proceed  Plan: Coronary artery bypass grafting, Maze procedure, possible mitral and/or tricuspid repair on Monday 04-25-17  She will stop Plavix after her dose on September 26 and Xarelto after her dose on September 28  Loreli Slot, MD [facility] [phone]

## 2017-04-09 ENCOUNTER — Encounter (HOSPITAL_COMMUNITY): Payer: Medicare HMO

## 2017-04-12 ENCOUNTER — Ambulatory Visit: Payer: Medicare HMO | Admitting: Family Medicine

## 2017-04-13 ENCOUNTER — Telehealth: Payer: Self-pay | Admitting: Cardiovascular Disease

## 2017-04-13 MED ORDER — RIVAROXABAN 20 MG PO TABS: 20.0000 mg | ORAL_TABLET | Freq: Every day | ORAL | 0 refills | Status: DC

## 2017-04-13 MED ORDER — RIVAROXABAN 20 MG PO TABS: 20.0000 mg | ORAL_TABLET | Freq: Every day | ORAL | 2 refills | Status: AC

## 2017-04-13 NOTE — Telephone Encounter (Signed)
New message     *STAT* If patient is at the pharmacy, call can be transferred to refill team.   1. Which medications need to be refilled? (please list name of each medication and dose if known) Rivaroxaban (XARELTO) 20 MG TABS tablet  2. Which pharmacy/location (including street and city if local pharmacy) is medication to be sent to? [facility]  3. Do they need a 30 day or 90 day supply? 90 day supply

## 2017-04-13 NOTE — Telephone Encounter (Signed)
Spoke w patient, informed her refill would be sent in as requested - she verbalized understanding and thanks. Aware to keep scheduled procedures and follow up with Dr. Allyson Sabal in October.

## 2017-04-13 NOTE — Telephone Encounter (Signed)
New message °  °  °  °  °*STAT* If patient is at the pharmacy, call can be transferred to refill team. °  °  °1. Which medications need to be refilled? (please list name of each medication and dose if known) Rivaroxaban (XARELTO) 20 MG TABS tablet °  °2. Which pharmacy/location (including street and city if local pharmacy) is medication to be sent to? [facility] °  °3. Do they need a 30 day or 90 day supply? 90 day supply °  °

## 2017-04-13 NOTE — Addendum Note (Signed)
Addended by: Ladell Heads on: 04/13/2017 11:48 AM   Modules accepted: Orders

## 2017-04-15 NOTE — Pre-Procedure Instructions (Signed)
Christy Campbell  04/15/2017      Your procedure is scheduled on Monday, October 1.  Report to [facility] Admitting at 5:30 AM                      Your surgery or procedure is scheduled for 7:30 A.M.   Call this number if you have problems the morning of [phone]- pre- op desk   Remember:  Do not eat food or drink liquids after midnight Sunday, September 30.  Take these medicines the morning of surgery with A SIP OF WATER : diltiazem (CARDIZEM CD) metoprolol tartrate (LOPRESSOR)  pantoprazole (PROTONIX)               Take if needed: Nitroglycerin.     clopidogrel (PLAVIX) and rivaroxaban (XARELTO) - follow Dr Hendrickson's instructions        STOP taking Aspirin, Aspirin Products (Goody Powder, Excedrin Migraine), Ibuprofen (Advil), Naproxen (Aleve), Vitamins and Herbal Products (ie Fish Oil)       Special instructions:   [place]- Preparing For Surgery  Before surgery, you can play an important role. Because skin is not sterile, your skin needs to be as free of germs as possible. You can reduce the number of germs on your skin by washing with CHG (chlorahexidine gluconate) Soap before surgery.  CHG is an antiseptic cleaner which kills germs and bonds with the skin to continue killing germs even after washing.  Please do not use if you have an allergy to CHG or antibacterial soaps. If your skin becomes reddened/irritated stop using the CHG.  Do not shave (including legs and underarms) for at least 48 hours prior to first CHG shower. It is OK to shave your face.  Please follow these instructions carefully.   1. Shower the NIGHT BEFORE SURGERY and the MORNING OF SURGERY with CHG.   2. If you chose to wash your hair, wash your hair first as usual with your normal shampoo.  3. After you shampoo, rinse your hair and body thoroughly to remove the shampoo.         Wash your face and private area with the soap you use at home, then rinse. 4.  5.  Use CHG  as you would any other liquid soap. You can apply CHG directly to the skin and wash gently with a scrungie or a clean washcloth.   6. Apply the CHG Soap to your body ONLY FROM THE NECK DOWN.  Do not use on open wounds or open sores. Avoid contact with your eyes, ears, mouth and genitals (private parts). Wash genitals (private parts) with your normal soap.  7. Wash thoroughly, paying special attention to the area where your surgery will be performed.  8. Thoroughly rinse your body with warm water from the neck down.  9. DO NOT shower/wash with your normal soap after using and rinsing off the CHG Soap.  10. Pat yourself dry with a CLEAN TOWEL.   11. Wear CLEAN PAJAMAS   12. Place CLEAN SHEETS on your bed the night of your first shower and DO NOT SLEEP WITH PETS.  Day of Surgery: Shower as above Do not apply any deodorants/lotions, powders or colognes. Please wear clean clothes to the hospital/surgery center.   Do not wear jewelry, make-up or nail polish..  Do not shave 48 hours prior to surgery.   Do not bring valuables to the hospital.  [facility] is not responsible for any  belongings or valuables.  Contacts, dentures or bridgework may not be worn into surgery.  Leave your suitcase in the car.  After surgery it may be brought to your room.  For patients admitted to the hospital, discharge time will be determined by your treatment team.  Please read over the following fact sheets that you were given: Pain Booklet, Patient Instructions for Mupirocin Application, Incentive Spirometry, Surgical Site Infections.

## 2017-04-16 ENCOUNTER — Ambulatory Visit (HOSPITAL_COMMUNITY)
Admission: RE | Admit: 2017-04-16 | Discharge: 2017-04-16 | Disposition: A | Discharge status: Home / Self Care | Payer: Medicare HMO | Source: Ambulatory Visit | Attending: Thoracic Surgery (Cardiothoracic Vascular Surgery) | Admitting: Thoracic Surgery (Cardiothoracic Vascular Surgery)

## 2017-04-16 ENCOUNTER — Ambulatory Visit: Payer: Medicare HMO | Admitting: Cardiovascular Disease

## 2017-04-16 ENCOUNTER — Encounter (HOSPITAL_COMMUNITY): Payer: Self-pay

## 2017-04-16 ENCOUNTER — Other Ambulatory Visit: Payer: Self-pay | Admitting: *Deleted

## 2017-04-16 ENCOUNTER — Encounter (HOSPITAL_COMMUNITY)
Admission: RE | Admit: 2017-04-16 | Discharge: 2017-04-16 | Disposition: A | Discharge status: Home / Self Care | Payer: Medicare HMO | Source: Ambulatory Visit | Attending: Thoracic Surgery (Cardiothoracic Vascular Surgery) | Admitting: Thoracic Surgery (Cardiothoracic Vascular Surgery)

## 2017-04-16 DIAGNOSIS — I2510 Atherosclerotic heart disease of native coronary artery without angina pectoris: Secondary | ICD-10-CM | POA: Insufficient documentation

## 2017-04-16 DIAGNOSIS — Z01812 Encounter for preprocedural laboratory examination: Secondary | ICD-10-CM | POA: Insufficient documentation

## 2017-04-16 DIAGNOSIS — J90 Pleural effusion, not elsewhere classified: Secondary | ICD-10-CM

## 2017-04-16 DIAGNOSIS — Z0183 Encounter for blood typing: Secondary | ICD-10-CM | POA: Insufficient documentation

## 2017-04-16 DIAGNOSIS — Z01818 Encounter for other preprocedural examination: Secondary | ICD-10-CM

## 2017-04-16 DIAGNOSIS — I517 Cardiomegaly: Secondary | ICD-10-CM

## 2017-04-16 DIAGNOSIS — I4891 Unspecified atrial fibrillation: Secondary | ICD-10-CM

## 2017-04-16 DIAGNOSIS — I509 Heart failure, unspecified: Secondary | ICD-10-CM

## 2017-04-16 HISTORY — DX: Personal history of urinary calculi: Z87.442

## 2017-04-16 HISTORY — DX: Personal history of other medical treatment: Z92.89

## 2017-04-16 HISTORY — DX: Cardiac arrhythmia, unspecified: I49.9

## 2017-04-16 HISTORY — DX: Pneumonia, unspecified organism: J18.9

## 2017-04-16 HISTORY — DX: Dyspnea, unspecified: R06.00

## 2017-04-16 HISTORY — DX: Acute myocardial infarction, unspecified: I21.9

## 2017-04-16 LAB — PULMONARY FUNCTION TEST
DL/VA % PRED: 88 %
DL/VA: 4.44 ml/min/mmHg/L
DLCO UNC % PRED: 56 %
DLCO cor % pred: 57 %
DLCO cor: 15.41 ml/min/mmHg
DLCO unc: 15.22 ml/min/mmHg
FEF 25-75 PRE: 1.12 L/s
FEF 25-75 Post: 1.79 L/sec
FEF2575-%CHANGE-POST: 59 %
FEF2575-%PRED-POST: 103 %
FEF2575-%Pred-Pre: 64 %
FEV1-%CHANGE-POST: 9 %
FEV1-%Pred-Post: 74 %
FEV1-%Pred-Pre: 67 %
FEV1-PRE: 1.54 L
FEV1-Post: 1.68 L
FEV1FVC-%CHANGE-POST: 8 %
FEV1FVC-%Pred-Pre: 99 %
FEV6-%Change-Post: 0 %
FEV6-%Pred-Post: 72 %
FEV6-%Pred-Pre: 71 %
FEV6-PRE: 2.07 L
FEV6-Post: 2.08 L
FEV6FVC-%Change-Post: 0 %
FEV6FVC-%PRED-PRE: 104 %
FEV6FVC-%Pred-Post: 105 %
FVC-%Change-Post: 0 %
FVC-%PRED-POST: 68 %
FVC-%PRED-PRE: 68 %
FVC-POST: 2.08 L
FVC-Pre: 2.07 L
POST FEV1/FVC RATIO: 81 %
PRE FEV6/FVC RATIO: 100 %
Post FEV6/FVC ratio: 100 %
Pre FEV1/FVC ratio: 75 %
RV % PRED: 95 %
RV: 2.3 L
TLC % pred: 80 %
TLC: 4.32 L

## 2017-04-16 LAB — CBC
HCT: 38.9 % (ref 36.0–46.0)
Hemoglobin: 12.6 g/dL (ref 12.0–15.0)
MCH: 26.1 pg (ref 26.0–34.0)
MCHC: 32.4 g/dL (ref 30.0–36.0)
MCV: 80.7 fL (ref 78.0–100.0)
PLATELETS: 275 10*3/uL (ref 150–400)
RBC: 4.82 MIL/uL (ref 3.87–5.11)
RDW: 16.7 % — ABNORMAL HIGH (ref 11.5–15.5)
WBC: 13.5 10*3/uL — ABNORMAL HIGH (ref 4.0–10.5)

## 2017-04-16 LAB — PROTIME-INR
INR: 2.08
PROTHROMBIN TIME: 23.2 s — AB (ref 11.4–15.2)

## 2017-04-16 LAB — COMPREHENSIVE METABOLIC PANEL
ALK PHOS: 129 U/L — AB (ref 38–126)
ALT: 28 U/L (ref 14–54)
ANION GAP: 12 (ref 5–15)
AST: 28 U/L (ref 15–41)
Albumin: 3.7 g/dL (ref 3.5–5.0)
BUN: 25 mg/dL — ABNORMAL HIGH (ref 6–20)
CALCIUM: 9.3 mg/dL (ref 8.9–10.3)
CO2: 17 mmol/L — AB (ref 22–32)
Chloride: 109 mmol/L (ref 101–111)
Creatinine, Ser: 1.03 mg/dL — ABNORMAL HIGH (ref 0.44–1.00)
GFR calc non Af Amer: 52 mL/min — ABNORMAL LOW (ref >60)
Glucose, Bld: 114 mg/dL — ABNORMAL HIGH (ref 65–99)
Potassium: 3.7 mmol/L (ref 3.5–5.1)
SODIUM: 138 mmol/L (ref 135–145)
Total Bilirubin: 1.4 mg/dL — ABNORMAL HIGH (ref 0.3–1.2)
Total Protein: 6.7 g/dL (ref 6.5–8.1)

## 2017-04-16 LAB — BLOOD GAS, ARTERIAL
ACID-BASE DEFICIT: 1.2 mmol/L (ref 0.0–2.0)
BICARBONATE: 21.3 mmol/L (ref 20.0–28.0)
Drawn by: 449841
FIO2: 21
O2 SAT: 95.7 %
PO2 ART: 75.2 mmHg — AB (ref 83.0–108.0)
Patient temperature: 98.6
pCO2 arterial: 26.2 mmHg — ABNORMAL LOW (ref 32.0–48.0)
pH, Arterial: 7.522 — ABNORMAL HIGH (ref 7.350–7.450)

## 2017-04-16 LAB — URINALYSIS, ROUTINE W REFLEX MICROSCOPIC
Bilirubin Urine: NEGATIVE
Glucose, UA: NEGATIVE mg/dL
Hgb urine dipstick: NEGATIVE
Ketones, ur: NEGATIVE mg/dL
LEUKOCYTES UA: NEGATIVE
NITRITE: NEGATIVE
PH: 6 (ref 5.0–8.0)
Protein, ur: NEGATIVE mg/dL
SPECIFIC GRAVITY, URINE: 1.01 (ref 1.005–1.030)

## 2017-04-16 LAB — SURGICAL PCR SCREEN
MRSA, PCR: NEGATIVE
Staphylococcus aureus: NEGATIVE

## 2017-04-16 LAB — APTT: aPTT: 37 seconds — ABNORMAL HIGH (ref 24–36)

## 2017-04-16 LAB — HEMOGLOBIN A1C
Hgb A1c MFr Bld: 5.7 % — ABNORMAL HIGH (ref 4.8–5.6)
MEAN PLASMA GLUCOSE: 116.89 mg/dL

## 2017-04-16 MED ORDER — ALBUTEROL SULFATE (2.5 MG/3ML) 0.083% IN NEBU
2.5000 mg | INHALATION_SOLUTION | Freq: Once | RESPIRATORY_TRACT | Status: AC
  Administered 2017-04-16: 2.5 mg via RESPIRATORY_TRACT

## 2017-04-16 NOTE — Progress Notes (Signed)
I called Ryan and notified her of WBC of 13.5, Pt/INR 2 .08, PTT 37.  New orders were written.

## 2017-04-16 NOTE — Progress Notes (Signed)
Christy Campbell denies chest pain or shortness of breath at this time.  Patient stopped Plavix on 04/14/17,plast dose of Xarleto is 04/16/17- pm.

## 2017-04-18 MED ORDER — PHENYLEPHRINE HCL 10 MG/ML IJ SOLN
30.0000 ug/min | INTRAMUSCULAR | Status: AC
  Administered 2017-04-19: 25 ug/min via INTRAVENOUS
  Filled 2017-04-18: qty 2

## 2017-04-18 MED ORDER — MAGNESIUM SULFATE 50 % IJ SOLN
40.0000 meq | INTRAMUSCULAR | Status: DC
Start: 2017-04-19 — End: 2017-04-19
  Filled 2017-04-18: qty 10

## 2017-04-18 MED ORDER — TRANEXAMIC ACID (OHS) BOLUS VIA INFUSION
15.0000 mg/kg | INTRAVENOUS | Status: AC
  Administered 2017-04-19: 1239 mg via INTRAVENOUS
  Filled 2017-04-18: qty 1239

## 2017-04-18 MED ORDER — VANCOMYCIN HCL 10 G IV SOLR
1250.0000 mg | INTRAVENOUS | Status: AC
  Administered 2017-04-19: 1250 mg via INTRAVENOUS
  Filled 2017-04-18: qty 1250

## 2017-04-18 MED ORDER — SODIUM CHLORIDE 0.9 % IV SOLN
INTRAVENOUS | Status: DC
  Filled 2017-04-18: qty 30

## 2017-04-18 MED ORDER — TRANEXAMIC ACID (OHS) PUMP PRIME SOLUTION
2.0000 mg/kg | INTRAVENOUS | Status: DC
  Filled 2017-04-18: qty 1.65

## 2017-04-18 MED ORDER — NITROGLYCERIN IN D5W 200-5 MCG/ML-% IV SOLN
2.0000 ug/min | INTRAVENOUS | Status: AC
  Administered 2017-04-19: 16.6 ug/min via INTRAVENOUS
  Filled 2017-04-18: qty 250

## 2017-04-18 MED ORDER — PLASMA-LYTE 148 IV SOLN
INTRAVENOUS | Status: AC
  Administered 2017-04-19: 500 mL
  Filled 2017-04-18: qty 2.5

## 2017-04-18 MED ORDER — DEXMEDETOMIDINE HCL IN NACL 400 MCG/100ML IV SOLN
0.1000 ug/kg/h | INTRAVENOUS | Status: AC
Start: 2017-04-19 — End: 2017-04-19
  Administered 2017-04-19: 0.7 ug/kg/h via INTRAVENOUS
  Filled 2017-04-18: qty 100

## 2017-04-18 MED ORDER — SODIUM CHLORIDE 0.9 % IV SOLN
INTRAVENOUS | Status: AC
  Administered 2017-04-19: 1 [IU]/h via INTRAVENOUS
  Filled 2017-04-18: qty 1

## 2017-04-18 MED ORDER — METOPROLOL TARTRATE 12.5 MG HALF TABLET: 12.5000 mg | ORAL_TABLET | Freq: Once | ORAL | Status: DC

## 2017-04-18 MED ORDER — DOPAMINE-DEXTROSE 3.2-5 MG/ML-% IV SOLN
0.0000 ug/kg/min | INTRAVENOUS | Status: AC
  Administered 2017-04-19: 5 ug/kg/min via INTRAVENOUS
  Filled 2017-04-18: qty 250

## 2017-04-18 MED ORDER — DEXTROSE 5 % IV SOLN
0.0000 ug/min | INTRAVENOUS | Status: DC
  Filled 2017-04-18: qty 4

## 2017-04-18 MED ORDER — DEXTROSE 5 % IV SOLN
750.0000 mg | INTRAVENOUS | Status: DC
  Filled 2017-04-18: qty 750

## 2017-04-18 MED ORDER — DEXTROSE 5 % IV SOLN
1.5000 g | INTRAVENOUS | Status: AC
  Administered 2017-04-19: .75 g via INTRAVENOUS
  Administered 2017-04-19: 1.5 g via INTRAVENOUS
  Filled 2017-04-18: qty 1.5

## 2017-04-18 MED ORDER — POTASSIUM CHLORIDE 2 MEQ/ML IV SOLN
80.0000 meq | INTRAVENOUS | Status: DC
  Filled 2017-04-18: qty 40

## 2017-04-18 MED ORDER — SODIUM CHLORIDE 0.9 % IV SOLN
1.5000 mg/kg/h | INTRAVENOUS | Status: AC
  Administered 2017-04-19: 1.5 mg/kg/h via INTRAVENOUS
  Filled 2017-04-18: qty 25

## 2017-04-19 ENCOUNTER — Inpatient Hospital Stay (HOSPITAL_COMMUNITY): Payer: Medicare HMO | Admitting: Certified Registered Nurse Anesthetist

## 2017-04-19 ENCOUNTER — Inpatient Hospital Stay (HOSPITAL_COMMUNITY): Payer: Medicare HMO

## 2017-04-19 ENCOUNTER — Ambulatory Visit (HOSPITAL_COMMUNITY): Payer: Medicare HMO

## 2017-04-19 ENCOUNTER — Inpatient Hospital Stay (HOSPITAL_COMMUNITY)
Admission: AD | Admit: 2017-04-19 | Discharge: 2017-05-20 | DRG: 219 | Disposition: E | Payer: Medicare HMO | Source: Ambulatory Visit | Attending: Thoracic Surgery (Cardiothoracic Vascular Surgery) | Admitting: Thoracic Surgery (Cardiothoracic Vascular Surgery)

## 2017-04-19 ENCOUNTER — Inpatient Hospital Stay (HOSPITAL_COMMUNITY)
Admission: AD | Disposition: E | Payer: Self-pay | Source: Ambulatory Visit | Attending: Thoracic Surgery (Cardiothoracic Vascular Surgery)

## 2017-04-19 ENCOUNTER — Encounter (HOSPITAL_COMMUNITY): Payer: Self-pay | Admitting: *Deleted

## 2017-04-19 DIAGNOSIS — I2582 Chronic total occlusion of coronary artery: Secondary | ICD-10-CM | POA: Diagnosis present

## 2017-04-19 DIAGNOSIS — J80 Acute respiratory distress syndrome: Secondary | ICD-10-CM | POA: Diagnosis not present

## 2017-04-19 DIAGNOSIS — I639 Cerebral infarction, unspecified: Secondary | ICD-10-CM | POA: Diagnosis not present

## 2017-04-19 DIAGNOSIS — E874 Mixed disorder of acid-base balance: Secondary | ICD-10-CM | POA: Diagnosis not present

## 2017-04-19 DIAGNOSIS — E785 Hyperlipidemia, unspecified: Secondary | ICD-10-CM | POA: Diagnosis present

## 2017-04-19 DIAGNOSIS — Z7901 Long term (current) use of anticoagulants: Secondary | ICD-10-CM

## 2017-04-19 DIAGNOSIS — Y95 Nosocomial condition: Secondary | ICD-10-CM | POA: Diagnosis not present

## 2017-04-19 DIAGNOSIS — I252 Old myocardial infarction: Secondary | ICD-10-CM | POA: Diagnosis not present

## 2017-04-19 DIAGNOSIS — D62 Acute posthemorrhagic anemia: Secondary | ICD-10-CM | POA: Diagnosis not present

## 2017-04-19 DIAGNOSIS — I340 Nonrheumatic mitral (valve) insufficiency: Secondary | ICD-10-CM

## 2017-04-19 DIAGNOSIS — R570 Cardiogenic shock: Secondary | ICD-10-CM | POA: Diagnosis not present

## 2017-04-19 DIAGNOSIS — I081 Rheumatic disorders of both mitral and tricuspid valves: Secondary | ICD-10-CM | POA: Diagnosis present

## 2017-04-19 DIAGNOSIS — I509 Heart failure, unspecified: Secondary | ICD-10-CM | POA: Diagnosis present

## 2017-04-19 DIAGNOSIS — I4891 Unspecified atrial fibrillation: Secondary | ICD-10-CM | POA: Diagnosis present

## 2017-04-19 DIAGNOSIS — E876 Hypokalemia: Secondary | ICD-10-CM | POA: Diagnosis present

## 2017-04-19 DIAGNOSIS — Z7902 Long term (current) use of antithrombotics/antiplatelets: Secondary | ICD-10-CM | POA: Diagnosis not present

## 2017-04-19 DIAGNOSIS — J181 Lobar pneumonia, unspecified organism: Secondary | ICD-10-CM | POA: Diagnosis not present

## 2017-04-19 DIAGNOSIS — I8393 Asymptomatic varicose veins of bilateral lower extremities: Secondary | ICD-10-CM | POA: Diagnosis present

## 2017-04-19 DIAGNOSIS — I361 Nonrheumatic tricuspid (valve) insufficiency: Secondary | ICD-10-CM | POA: Diagnosis not present

## 2017-04-19 DIAGNOSIS — A419 Sepsis, unspecified organism: Secondary | ICD-10-CM | POA: Diagnosis not present

## 2017-04-19 DIAGNOSIS — I110 Hypertensive heart disease with heart failure: Secondary | ICD-10-CM | POA: Diagnosis present

## 2017-04-19 DIAGNOSIS — N179 Acute kidney failure, unspecified: Secondary | ICD-10-CM | POA: Diagnosis not present

## 2017-04-19 DIAGNOSIS — Z955 Presence of coronary angioplasty implant and graft: Secondary | ICD-10-CM

## 2017-04-19 DIAGNOSIS — R6521 Severe sepsis with septic shock: Secondary | ICD-10-CM | POA: Diagnosis not present

## 2017-04-19 DIAGNOSIS — Z9049 Acquired absence of other specified parts of digestive tract: Secondary | ICD-10-CM

## 2017-04-19 DIAGNOSIS — D696 Thrombocytopenia, unspecified: Secondary | ICD-10-CM | POA: Diagnosis present

## 2017-04-19 DIAGNOSIS — J189 Pneumonia, unspecified organism: Secondary | ICD-10-CM | POA: Diagnosis not present

## 2017-04-19 DIAGNOSIS — R0602 Shortness of breath: Secondary | ICD-10-CM

## 2017-04-19 DIAGNOSIS — Z87891 Personal history of nicotine dependence: Secondary | ICD-10-CM

## 2017-04-19 DIAGNOSIS — I469 Cardiac arrest, cause unspecified: Secondary | ICD-10-CM | POA: Diagnosis not present

## 2017-04-19 DIAGNOSIS — J9601 Acute respiratory failure with hypoxia: Secondary | ICD-10-CM | POA: Diagnosis not present

## 2017-04-19 DIAGNOSIS — Z951 Presence of aortocoronary bypass graft: Secondary | ICD-10-CM | POA: Diagnosis not present

## 2017-04-19 DIAGNOSIS — I2510 Atherosclerotic heart disease of native coronary artery without angina pectoris: Principal | ICD-10-CM | POA: Diagnosis present

## 2017-04-19 DIAGNOSIS — K219 Gastro-esophageal reflux disease without esophagitis: Secondary | ICD-10-CM | POA: Diagnosis present

## 2017-04-19 DIAGNOSIS — K559 Vascular disorder of intestine, unspecified: Secondary | ICD-10-CM | POA: Diagnosis not present

## 2017-04-19 DIAGNOSIS — Z9889 Other specified postprocedural states: Secondary | ICD-10-CM

## 2017-04-19 DIAGNOSIS — I481 Persistent atrial fibrillation: Secondary | ICD-10-CM | POA: Diagnosis not present

## 2017-04-19 DIAGNOSIS — J9811 Atelectasis: Secondary | ICD-10-CM

## 2017-04-19 HISTORY — PX: TEE WITHOUT CARDIOVERSION: SHX5443

## 2017-04-19 HISTORY — PX: MITRAL VALVE REPAIR: SHX2039

## 2017-04-19 HISTORY — PX: CORONARY ARTERY BYPASS GRAFT: SHX141

## 2017-04-19 HISTORY — PX: CLIPPING OF ATRIAL APPENDAGE: SHX5773

## 2017-04-19 HISTORY — PX: MAZE: SHX5063

## 2017-04-19 LAB — POCT I-STAT 3, ART BLOOD GAS (G3+)
ACID-BASE DEFICIT: 2 mmol/L (ref 0.0–2.0)
ACID-BASE DEFICIT: 4 mmol/L — AB (ref 0.0–2.0)
Acid-base deficit: 3 mmol/L — ABNORMAL HIGH (ref 0.0–2.0)
BICARBONATE: 25.3 mmol/L (ref 20.0–28.0)
BICARBONATE: 23 mmol/L (ref 20.0–28.0)
BICARBONATE: 22.7 mmol/L (ref 20.0–28.0)
Bicarbonate: 24.9 mmol/L (ref 20.0–28.0)
Bicarbonate: 21.6 mmol/L (ref 20.0–28.0)
O2 SAT: 90 %
O2 SAT: 94 %
O2 SAT: 92 %
O2 Saturation: 100 %
O2 Saturation: 100 %
PCO2 ART: 33.9 mmHg (ref 32.0–48.0)
PH ART: 7.365 (ref 7.350–7.450)
PH ART: 7.406 (ref 7.350–7.450)
PH ART: 7.41 (ref 7.350–7.450)
PO2 ART: 304 mmHg — AB (ref 83.0–108.0)
PO2 ART: 58 mmHg — AB (ref 83.0–108.0)
PO2 ART: 76 mmHg — AB (ref 83.0–108.0)
PO2 ART: 61 mmHg — AB (ref 83.0–108.0)
Patient temperature: 36.2
Patient temperature: 36.1
Patient temperature: 36.4
TCO2: 27 mmol/L (ref 22–32)
TCO2: 24 mmol/L (ref 22–32)
TCO2: 26 mmol/L (ref 22–32)
TCO2: 24 mmol/L (ref 22–32)
TCO2: 23 mmol/L (ref 22–32)
pCO2 arterial: 44.2 mmHg (ref 32.0–48.0)
pCO2 arterial: 37.2 mmHg (ref 32.0–48.0)
pCO2 arterial: 39.3 mmHg (ref 32.0–48.0)
pCO2 arterial: 46.7 mmHg (ref 32.0–48.0)
pH, Arterial: 7.398 (ref 7.350–7.450)
pH, Arterial: 7.289 — ABNORMAL LOW (ref 7.350–7.450)
pO2, Arterial: 287 mmHg — ABNORMAL HIGH (ref 83.0–108.0)

## 2017-04-19 LAB — POCT I-STAT, CHEM 8
BUN: 23 mg/dL — ABNORMAL HIGH (ref 6–20)
BUN: 20 mg/dL (ref 6–20)
BUN: 25 mg/dL — ABNORMAL HIGH (ref 6–20)
BUN: 23 mg/dL — ABNORMAL HIGH (ref 6–20)
BUN: 22 mg/dL — AB (ref 6–20)
BUN: 24 mg/dL — AB (ref 6–20)
BUN: 24 mg/dL — AB (ref 6–20)
BUN: 21 mg/dL — ABNORMAL HIGH (ref 6–20)
CALCIUM ION: 1.21 mmol/L (ref 1.15–1.40)
CALCIUM ION: 1.2 mmol/L (ref 1.15–1.40)
CALCIUM ION: 0.96 mmol/L — AB (ref 1.15–1.40)
CHLORIDE: 103 mmol/L (ref 101–111)
CHLORIDE: 100 mmol/L — AB (ref 101–111)
CHLORIDE: 102 mmol/L (ref 101–111)
CHLORIDE: 102 mmol/L (ref 101–111)
CREATININE: 0.8 mg/dL (ref 0.44–1.00)
CREATININE: 0.7 mg/dL (ref 0.44–1.00)
CREATININE: 0.8 mg/dL (ref 0.44–1.00)
CREATININE: 0.9 mg/dL (ref 0.44–1.00)
Calcium, Ion: 1.05 mmol/L — ABNORMAL LOW (ref 1.15–1.40)
Calcium, Ion: 1.07 mmol/L — ABNORMAL LOW (ref 1.15–1.40)
Calcium, Ion: 0.99 mmol/L — ABNORMAL LOW (ref 1.15–1.40)
Calcium, Ion: 1 mmol/L — ABNORMAL LOW (ref 1.15–1.40)
Calcium, Ion: 1.17 mmol/L (ref 1.15–1.40)
Chloride: 103 mmol/L (ref 101–111)
Chloride: 99 mmol/L — ABNORMAL LOW (ref 101–111)
Chloride: 102 mmol/L (ref 101–111)
Chloride: 106 mmol/L (ref 101–111)
Creatinine, Ser: 0.9 mg/dL (ref 0.44–1.00)
Creatinine, Ser: 0.8 mg/dL (ref 0.44–1.00)
Creatinine, Ser: 0.9 mg/dL (ref 0.44–1.00)
Creatinine, Ser: 0.7 mg/dL (ref 0.44–1.00)
GLUCOSE: 99 mg/dL (ref 65–99)
GLUCOSE: 102 mg/dL — AB (ref 65–99)
GLUCOSE: 174 mg/dL — AB (ref 65–99)
GLUCOSE: 122 mg/dL — AB (ref 65–99)
Glucose, Bld: 133 mg/dL — ABNORMAL HIGH (ref 65–99)
Glucose, Bld: 115 mg/dL — ABNORMAL HIGH (ref 65–99)
Glucose, Bld: 99 mg/dL (ref 65–99)
Glucose, Bld: 118 mg/dL — ABNORMAL HIGH (ref 65–99)
HCT: 33 % — ABNORMAL LOW (ref 36.0–46.0)
HCT: 20 % — ABNORMAL LOW (ref 36.0–46.0)
HCT: 21 % — ABNORMAL LOW (ref 36.0–46.0)
HEMATOCRIT: 23 % — AB (ref 36.0–46.0)
HEMATOCRIT: 31 % — AB (ref 36.0–46.0)
HEMATOCRIT: 24 % — AB (ref 36.0–46.0)
HEMATOCRIT: 21 % — AB (ref 36.0–46.0)
HEMATOCRIT: 20 % — AB (ref 36.0–46.0)
HEMOGLOBIN: 11.2 g/dL — AB (ref 12.0–15.0)
HEMOGLOBIN: 10.5 g/dL — AB (ref 12.0–15.0)
HEMOGLOBIN: 7.8 g/dL — AB (ref 12.0–15.0)
HEMOGLOBIN: 6.8 g/dL — AB (ref 12.0–15.0)
Hemoglobin: 8.2 g/dL — ABNORMAL LOW (ref 12.0–15.0)
Hemoglobin: 6.8 g/dL — CL (ref 12.0–15.0)
Hemoglobin: 7.1 g/dL — ABNORMAL LOW (ref 12.0–15.0)
Hemoglobin: 7.1 g/dL — ABNORMAL LOW (ref 12.0–15.0)
POTASSIUM: 3.2 mmol/L — AB (ref 3.5–5.1)
POTASSIUM: 3.4 mmol/L — AB (ref 3.5–5.1)
POTASSIUM: 3.9 mmol/L (ref 3.5–5.1)
POTASSIUM: 4.4 mmol/L (ref 3.5–5.1)
POTASSIUM: 3.9 mmol/L (ref 3.5–5.1)
Potassium: 3.1 mmol/L — ABNORMAL LOW (ref 3.5–5.1)
Potassium: 3.4 mmol/L — ABNORMAL LOW (ref 3.5–5.1)
Potassium: 4.4 mmol/L (ref 3.5–5.1)
SODIUM: 141 mmol/L (ref 135–145)
SODIUM: 138 mmol/L (ref 135–145)
SODIUM: 140 mmol/L (ref 135–145)
SODIUM: 140 mmol/L (ref 135–145)
SODIUM: 140 mmol/L (ref 135–145)
Sodium: 140 mmol/L (ref 135–145)
Sodium: 139 mmol/L (ref 135–145)
Sodium: 141 mmol/L (ref 135–145)
TCO2: 24 mmol/L (ref 22–32)
TCO2: 26 mmol/L (ref 22–32)
TCO2: 27 mmol/L (ref 22–32)
TCO2: 28 mmol/L (ref 22–32)
TCO2: 28 mmol/L (ref 22–32)
TCO2: 29 mmol/L (ref 22–32)
TCO2: 23 mmol/L (ref 22–32)
TCO2: 22 mmol/L (ref 22–32)

## 2017-04-19 LAB — CBC
HCT: 24.1 % — ABNORMAL LOW (ref 36.0–46.0)
HEMATOCRIT: 34 % — AB (ref 36.0–46.0)
HEMOGLOBIN: 7.8 g/dL — AB (ref 12.0–15.0)
Hemoglobin: 11.3 g/dL — ABNORMAL LOW (ref 12.0–15.0)
MCH: 27 pg (ref 26.0–34.0)
MCH: 26.3 pg (ref 26.0–34.0)
MCHC: 33.2 g/dL (ref 30.0–36.0)
MCHC: 32.4 g/dL (ref 30.0–36.0)
MCV: 81.1 fL (ref 78.0–100.0)
MCV: 81.1 fL (ref 78.0–100.0)
Platelets: 158 10*3/uL (ref 150–400)
Platelets: 130 10*3/uL — ABNORMAL LOW (ref 150–400)
RBC: 4.19 MIL/uL (ref 3.87–5.11)
RBC: 2.97 MIL/uL — AB (ref 3.87–5.11)
RDW: 17 % — AB (ref 11.5–15.5)
RDW: 16.5 % — ABNORMAL HIGH (ref 11.5–15.5)
WBC: 32.3 10*3/uL — ABNORMAL HIGH (ref 4.0–10.5)
WBC: 23.5 10*3/uL — AB (ref 4.0–10.5)

## 2017-04-19 LAB — GLUCOSE, CAPILLARY
GLUCOSE-CAPILLARY: 134 mg/dL — AB (ref 65–99)
GLUCOSE-CAPILLARY: 145 mg/dL — AB (ref 65–99)
Glucose-Capillary: 140 mg/dL — ABNORMAL HIGH (ref 65–99)
Glucose-Capillary: 123 mg/dL — ABNORMAL HIGH (ref 65–99)
Glucose-Capillary: 116 mg/dL — ABNORMAL HIGH (ref 65–99)
Glucose-Capillary: 167 mg/dL — ABNORMAL HIGH (ref 65–99)

## 2017-04-19 LAB — PROTIME-INR
INR: 1.18
INR: 1.85
PROTHROMBIN TIME: 14.9 s (ref 11.4–15.2)
Prothrombin Time: 21.2 seconds — ABNORMAL HIGH (ref 11.4–15.2)

## 2017-04-19 LAB — CREATININE, SERUM
CREATININE: 0.98 mg/dL (ref 0.44–1.00)
GFR calc Af Amer: >60 mL/min (ref >60)
GFR calc non Af Amer: 55 mL/min — ABNORMAL LOW (ref >60)

## 2017-04-19 LAB — POCT I-STAT 4, (NA,K, GLUC, HGB,HCT)
GLUCOSE: 147 mg/dL — AB (ref 65–99)
HEMATOCRIT: 32 % — AB (ref 36.0–46.0)
HEMOGLOBIN: 10.9 g/dL — AB (ref 12.0–15.0)
Potassium: 4 mmol/L (ref 3.5–5.1)
Sodium: 142 mmol/L (ref 135–145)

## 2017-04-19 LAB — COOXEMETRY PANEL
CARBOXYHEMOGLOBIN: 1.3 % (ref 0.5–1.5)
CARBOXYHEMOGLOBIN: 1.2 % (ref 0.5–1.5)
METHEMOGLOBIN: 1 % (ref 0.0–1.5)
METHEMOGLOBIN: 1.1 % (ref 0.0–1.5)
O2 Saturation: 38.9 %
O2 Saturation: 59.8 %
TOTAL HEMOGLOBIN: 7.8 g/dL — AB (ref 12.0–16.0)
TOTAL HEMOGLOBIN: 7.4 g/dL — AB (ref 12.0–16.0)

## 2017-04-19 LAB — HEMOGLOBIN AND HEMATOCRIT, BLOOD
HEMATOCRIT: 21.3 % — AB (ref 36.0–46.0)
HEMOGLOBIN: 7.1 g/dL — AB (ref 12.0–15.0)

## 2017-04-19 LAB — PREPARE RBC (CROSSMATCH)

## 2017-04-19 LAB — APTT: aPTT: 34 seconds (ref 24–36)

## 2017-04-19 LAB — PLATELET COUNT: PLATELETS: 138 10*3/uL — AB (ref 150–400)

## 2017-04-19 LAB — MAGNESIUM: MAGNESIUM: 3.3 mg/dL — AB (ref 1.7–2.4)

## 2017-04-19 SURGERY — CORONARY ARTERY BYPASS GRAFTING (CABG)
Anesthesia: General | Site: Chest

## 2017-04-19 MED ORDER — SODIUM CHLORIDE 0.9 % IV SOLN
0.0000 ug/kg/h | INTRAVENOUS | Status: DC
  Administered 2017-04-19: 0.5 ug/kg/h via INTRAVENOUS
  Administered 2017-04-20: 0.7 ug/kg/h via INTRAVENOUS
  Filled 2017-04-19 (×5): qty 2

## 2017-04-19 MED ORDER — PHENYLEPHRINE 40 MCG/ML (10ML) SYRINGE FOR IV PUSH (FOR BLOOD PRESSURE SUPPORT)
PREFILLED_SYRINGE | INTRAVENOUS | Status: AC
  Filled 2017-04-19: qty 20

## 2017-04-19 MED ORDER — LACTATED RINGERS IV SOLN: INTRAVENOUS | Status: DC

## 2017-04-19 MED ORDER — HEMOSTATIC AGENTS (NO CHARGE) OPTIME
TOPICAL | Status: DC | PRN
  Administered 2017-04-19: 1 via TOPICAL

## 2017-04-19 MED ORDER — METOPROLOL TARTRATE 5 MG/5ML IV SOLN
2.5000 mg | INTRAVENOUS | Status: DC | PRN
  Administered 2017-04-21: 5 mg via INTRAVENOUS
  Filled 2017-04-19 (×2): qty 5

## 2017-04-19 MED ORDER — SODIUM CHLORIDE 0.45 % IV SOLN
INTRAVENOUS | Status: DC | PRN
  Administered 2017-04-19: 15:00:00 via INTRAVENOUS

## 2017-04-19 MED ORDER — CALCIUM CHLORIDE 10 % IV SOLN
INTRAVENOUS | Status: DC | PRN
  Administered 2017-04-19: 400 mg via INTRAVENOUS

## 2017-04-19 MED ORDER — BISACODYL 10 MG RE SUPP: 10.0000 mg | Freq: Every day | RECTAL | Status: DC

## 2017-04-19 MED ORDER — SODIUM CHLORIDE 0.9 % IV SOLN
Freq: Once | INTRAVENOUS | Status: AC
  Administered 2017-04-19: 14:00:00 via INTRAVENOUS

## 2017-04-19 MED ORDER — SODIUM CHLORIDE 0.9 % IJ SOLN
INTRAMUSCULAR | Status: AC
  Filled 2017-04-19: qty 10

## 2017-04-19 MED ORDER — PROPOFOL 10 MG/ML IV BOLUS
INTRAVENOUS | Status: AC
  Filled 2017-04-19: qty 20

## 2017-04-19 MED ORDER — CHLORHEXIDINE GLUCONATE 0.12% ORAL RINSE (MEDLINE KIT)
15.0000 mL | Freq: Two times a day (BID) | OROMUCOSAL | Status: DC
  Administered 2017-04-20 – 2017-04-21 (×3): 15 mL via OROMUCOSAL

## 2017-04-19 MED ORDER — ASPIRIN 81 MG PO CHEW
324.0000 mg | CHEWABLE_TABLET | Freq: Every day | ORAL | Status: DC
  Administered 2017-04-20: 324 mg
  Filled 2017-04-19: qty 4

## 2017-04-19 MED ORDER — MORPHINE SULFATE (PF) 4 MG/ML IV SOLN: 1.0000 mg | INTRAVENOUS | Status: AC | PRN

## 2017-04-19 MED ORDER — SODIUM CHLORIDE 0.9 % IV SOLN: 250.0000 mL | INTRAVENOUS | Status: DC

## 2017-04-19 MED ORDER — MIDAZOLAM HCL 2 MG/2ML IJ SOLN
2.0000 mg | INTRAMUSCULAR | Status: DC | PRN
  Administered 2017-04-21: 2 mg via INTRAVENOUS
  Filled 2017-04-19: qty 2

## 2017-04-19 MED ORDER — ACETAMINOPHEN 650 MG RE SUPP
650.0000 mg | Freq: Once | RECTAL | Status: AC
  Administered 2017-04-19: 650 mg via RECTAL

## 2017-04-19 MED ORDER — SODIUM CHLORIDE 0.9 % IV SOLN: INTRAVENOUS | Status: DC

## 2017-04-19 MED ORDER — PHENYLEPHRINE 40 MCG/ML (10ML) SYRINGE FOR IV PUSH (FOR BLOOD PRESSURE SUPPORT)
PREFILLED_SYRINGE | INTRAVENOUS | Status: DC | PRN
  Administered 2017-04-19 (×2): 80 ug via INTRAVENOUS
  Administered 2017-04-19: 200 ug via INTRAVENOUS
  Administered 2017-04-19: 80 ug via INTRAVENOUS

## 2017-04-19 MED ORDER — DOPAMINE-DEXTROSE 3.2-5 MG/ML-% IV SOLN
8.0000 ug/kg/min | INTRAVENOUS | Status: DC
  Administered 2017-04-20: 6 ug/kg/min via INTRAVENOUS
  Filled 2017-04-19: qty 250

## 2017-04-19 MED ORDER — SCOPOLAMINE 1 MG/3DAYS TD PT72
MEDICATED_PATCH | TRANSDERMAL | Status: AC
  Filled 2017-04-19: qty 1

## 2017-04-19 MED ORDER — CHLORHEXIDINE GLUCONATE 0.12 % MT SOLN
15.0000 mL | Freq: Once | OROMUCOSAL | Status: AC
  Administered 2017-04-19: 15 mL via OROMUCOSAL
  Filled 2017-04-19: qty 15

## 2017-04-19 MED ORDER — TRAMADOL HCL 50 MG PO TABS: 50.0000 mg | ORAL_TABLET | ORAL | Status: DC | PRN

## 2017-04-19 MED ORDER — MILRINONE LOAD VIA INFUSION
50.0000 ug/kg | Freq: Once | INTRAVENOUS | Status: AC
  Administered 2017-04-19: 4200 ug via INTRAVENOUS
  Filled 2017-04-19: qty 4.2

## 2017-04-19 MED ORDER — ALBUMIN HUMAN 5 % IV SOLN: 12.5000 g | Freq: Once | INTRAVENOUS | Status: AC

## 2017-04-19 MED ORDER — ALBUMIN HUMAN 5 % IV SOLN
250.0000 mL | INTRAVENOUS | Status: AC | PRN
  Administered 2017-04-19 (×4): 250 mL via INTRAVENOUS
  Filled 2017-04-19: qty 250

## 2017-04-19 MED ORDER — SODIUM BICARBONATE 8.4 % IV SOLN
50.0000 meq | Freq: Once | INTRAVENOUS | Status: AC
  Administered 2017-04-19: 50 meq via INTRAVENOUS

## 2017-04-19 MED ORDER — LACTATED RINGERS IV SOLN
INTRAVENOUS | Status: DC | PRN
  Administered 2017-04-19: 07:00:00 via INTRAVENOUS

## 2017-04-19 MED ORDER — LIDOCAINE 2% (20 MG/ML) 5 ML SYRINGE
INTRAMUSCULAR | Status: AC
  Filled 2017-04-19: qty 5

## 2017-04-19 MED ORDER — ARTIFICIAL TEARS OPHTHALMIC OINT
TOPICAL_OINTMENT | OPHTHALMIC | Status: DC | PRN
  Administered 2017-04-19: 1 via OPHTHALMIC

## 2017-04-19 MED ORDER — EPINEPHRINE PF 1 MG/10ML IJ SOSY
PREFILLED_SYRINGE | INTRAMUSCULAR | Status: AC
  Filled 2017-04-19: qty 10

## 2017-04-19 MED ORDER — POTASSIUM CHLORIDE 10 MEQ/50ML IV SOLN: 10.0000 meq | INTRAVENOUS | Status: DC

## 2017-04-19 MED ORDER — NITROGLYCERIN 0.2 MG/ML ON CALL CATH LAB
INTRAVENOUS | Status: DC | PRN
  Administered 2017-04-19 (×2): 20 ug via INTRAVENOUS

## 2017-04-19 MED ORDER — SODIUM CHLORIDE 0.9 % IJ SOLN
OROMUCOSAL | Status: DC | PRN
  Administered 2017-04-19 (×3): 4 mL via TOPICAL

## 2017-04-19 MED ORDER — CALCIUM CHLORIDE 10 % IV SOLN
1.0000 g | Freq: Once | INTRAVENOUS | Status: AC
  Administered 2017-04-19: 1 g via INTRAVENOUS

## 2017-04-19 MED ORDER — INSULIN REGULAR BOLUS VIA INFUSION
0.0000 [IU] | Freq: Three times a day (TID) | INTRAVENOUS | Status: DC
  Filled 2017-04-19: qty 10

## 2017-04-19 MED ORDER — SODIUM CHLORIDE 0.9% FLUSH
3.0000 mL | Freq: Two times a day (BID) | INTRAVENOUS | Status: DC
  Administered 2017-04-20 (×2): 3 mL via INTRAVENOUS

## 2017-04-19 MED ORDER — CLOPIDOGREL BISULFATE 75 MG PO TABS
75.0000 mg | ORAL_TABLET | Freq: Every day | ORAL | Status: DC
  Administered 2017-04-20: 75 mg via ORAL
  Filled 2017-04-19: qty 1

## 2017-04-19 MED ORDER — MILRINONE LACTATE IN DEXTROSE 20-5 MG/100ML-% IV SOLN
0.1250 ug/kg/min | INTRAVENOUS | Status: DC
  Administered 2017-04-20 (×2): 0.25 ug/kg/min via INTRAVENOUS
  Administered 2017-04-21: 0.125 ug/kg/min via INTRAVENOUS
  Filled 2017-04-19 (×3): qty 100

## 2017-04-19 MED ORDER — MIDAZOLAM HCL 5 MG/5ML IJ SOLN
INTRAMUSCULAR | Status: DC | PRN
  Administered 2017-04-19: 2 mg via INTRAVENOUS
  Administered 2017-04-19: 3 mg via INTRAVENOUS
  Administered 2017-04-19: 2 mg via INTRAVENOUS
  Administered 2017-04-19: 3 mg via INTRAVENOUS

## 2017-04-19 MED ORDER — MAGNESIUM SULFATE 4 GM/100ML IV SOLN
4.0000 g | Freq: Once | INTRAVENOUS | Status: AC
  Administered 2017-04-19: 4 g via INTRAVENOUS
  Filled 2017-04-19: qty 100

## 2017-04-19 MED ORDER — NOREPINEPHRINE BITARTRATE 1 MG/ML IV SOLN
0.0000 ug/min | INTRAVENOUS | Status: DC
  Administered 2017-04-19: 2 ug/min via INTRAVENOUS
  Administered 2017-04-21: 40 ug/min via INTRAVENOUS
  Filled 2017-04-19 (×2): qty 4

## 2017-04-19 MED ORDER — DOCUSATE SODIUM 100 MG PO CAPS: 200.0000 mg | ORAL_CAPSULE | Freq: Every day | ORAL | Status: DC

## 2017-04-19 MED ORDER — MIDAZOLAM HCL 10 MG/2ML IJ SOLN
INTRAMUSCULAR | Status: AC
  Filled 2017-04-19: qty 2

## 2017-04-19 MED ORDER — METOPROLOL TARTRATE 25 MG/10 ML ORAL SUSPENSION
12.5000 mg | Freq: Two times a day (BID) | ORAL | Status: DC
  Filled 2017-04-19: qty 5

## 2017-04-19 MED ORDER — PROTAMINE SULFATE 10 MG/ML IV SOLN
INTRAVENOUS | Status: AC
Start: 2017-04-19 — End: 2017-04-19
  Filled 2017-04-19: qty 25

## 2017-04-19 MED ORDER — ALBUMIN HUMAN 5 % IV SOLN
250.0000 mL | INTRAVENOUS | Status: AC | PRN
  Administered 2017-04-20: 250 mL via INTRAVENOUS

## 2017-04-19 MED ORDER — CHLORHEXIDINE GLUCONATE 4 % EX LIQD
30.0000 mL | CUTANEOUS | Status: DC
Start: 2017-04-19 — End: 2017-04-19

## 2017-04-19 MED ORDER — ONDANSETRON HCL 4 MG/2ML IJ SOLN: 4.0000 mg | Freq: Four times a day (QID) | INTRAMUSCULAR | Status: DC | PRN

## 2017-04-19 MED ORDER — PROPOFOL 10 MG/ML IV BOLUS
INTRAVENOUS | Status: DC | PRN
  Administered 2017-04-19: 50 mg via INTRAVENOUS
  Administered 2017-04-19: 40 mg via INTRAVENOUS

## 2017-04-19 MED ORDER — 0.9 % SODIUM CHLORIDE (POUR BTL) OPTIME
TOPICAL | Status: DC | PRN
  Administered 2017-04-19: 6000 mL

## 2017-04-19 MED ORDER — SODIUM CHLORIDE 0.9 % IV SOLN
INTRAVENOUS | Status: DC
  Administered 2017-04-20: 3.6 [IU]/h via INTRAVENOUS
  Filled 2017-04-19: qty 1

## 2017-04-19 MED ORDER — ARTIFICIAL TEARS OPHTHALMIC OINT
TOPICAL_OINTMENT | OPHTHALMIC | Status: AC
  Filled 2017-04-19: qty 14

## 2017-04-19 MED ORDER — ALBUMIN HUMAN 25 % IV SOLN: 12.5000 g | Freq: Once | INTRAVENOUS | Status: DC

## 2017-04-19 MED ORDER — PROTAMINE SULFATE 10 MG/ML IV SOLN
INTRAVENOUS | Status: AC
  Filled 2017-04-19: qty 25

## 2017-04-19 MED ORDER — PROTAMINE SULFATE 10 MG/ML IV SOLN
INTRAVENOUS | Status: DC | PRN
  Administered 2017-04-19: 250 mg via INTRAVENOUS

## 2017-04-19 MED ORDER — CHLORHEXIDINE GLUCONATE 0.12 % MT SOLN
15.0000 mL | OROMUCOSAL | Status: AC
  Administered 2017-04-19: 15 mL via OROMUCOSAL

## 2017-04-19 MED ORDER — LACTATED RINGERS IV SOLN: 500.0000 mL | Freq: Once | INTRAVENOUS | Status: DC | PRN

## 2017-04-19 MED ORDER — HEPARIN SODIUM (PORCINE) 1000 UNIT/ML IJ SOLN
INTRAMUSCULAR | Status: DC | PRN
  Administered 2017-04-19: 2000 [IU] via INTRAVENOUS
  Administered 2017-04-19: 26000 [IU] via INTRAVENOUS

## 2017-04-19 MED ORDER — HEPARIN SODIUM (PORCINE) 1000 UNIT/ML IJ SOLN
INTRAMUSCULAR | Status: AC
  Filled 2017-04-19: qty 2

## 2017-04-19 MED ORDER — FAMOTIDINE IN NACL 20-0.9 MG/50ML-% IV SOLN
20.0000 mg | Freq: Two times a day (BID) | INTRAVENOUS | Status: AC
  Administered 2017-04-19 (×2): 20 mg via INTRAVENOUS
  Filled 2017-04-19: qty 50

## 2017-04-19 MED ORDER — MORPHINE SULFATE (PF) 2 MG/ML IV SOLN: 2.0000 mg | INTRAVENOUS | Status: DC | PRN

## 2017-04-19 MED ORDER — MORPHINE SULFATE (PF) 2 MG/ML IV SOLN: 1.0000 mg | INTRAVENOUS | Status: DC | PRN

## 2017-04-19 MED ORDER — LACTATED RINGERS IV SOLN
INTRAVENOUS | Status: DC | PRN
  Administered 2017-04-19 (×2): via INTRAVENOUS

## 2017-04-19 MED ORDER — BISACODYL 5 MG PO TBEC: 10.0000 mg | DELAYED_RELEASE_TABLET | Freq: Every day | ORAL | Status: DC

## 2017-04-19 MED ORDER — PANTOPRAZOLE SODIUM 40 MG PO TBEC
40.0000 mg | DELAYED_RELEASE_TABLET | Freq: Every day | ORAL | Status: DC
  Administered 2017-04-20: 40 mg via ORAL
  Filled 2017-04-19: qty 1

## 2017-04-19 MED ORDER — SODIUM CHLORIDE 0.9% FLUSH: 3.0000 mL | INTRAVENOUS | Status: DC | PRN

## 2017-04-19 MED ORDER — ACETAMINOPHEN 160 MG/5ML PO SOLN
1000.0000 mg | Freq: Four times a day (QID) | ORAL | Status: DC
  Administered 2017-04-19 – 2017-04-21 (×5): 1000 mg
  Filled 2017-04-19 (×5): qty 40.6

## 2017-04-19 MED ORDER — ACETAMINOPHEN 160 MG/5ML PO SOLN: 650.0000 mg | Freq: Once | ORAL | Status: AC

## 2017-04-19 MED ORDER — ROCURONIUM BROMIDE 10 MG/ML (PF) SYRINGE
PREFILLED_SYRINGE | INTRAVENOUS | Status: DC | PRN
  Administered 2017-04-19 (×4): 50 mg via INTRAVENOUS

## 2017-04-19 MED ORDER — METOPROLOL TARTRATE 12.5 MG HALF TABLET: 12.5000 mg | ORAL_TABLET | Freq: Two times a day (BID) | ORAL | Status: DC

## 2017-04-19 MED ORDER — ACETAMINOPHEN 500 MG PO TABS: 1000.0000 mg | ORAL_TABLET | Freq: Four times a day (QID) | ORAL | Status: DC

## 2017-04-19 MED ORDER — SODIUM CHLORIDE 0.9 % IV SOLN: Freq: Once | INTRAVENOUS | Status: DC

## 2017-04-19 MED ORDER — ORAL CARE MOUTH RINSE
15.0000 mL | OROMUCOSAL | Status: DC
  Administered 2017-04-19 – 2017-04-21 (×16): 15 mL via OROMUCOSAL

## 2017-04-19 MED ORDER — ALBUMIN HUMAN 5 % IV SOLN
INTRAVENOUS | Status: AC
  Administered 2017-04-19: 12.5 g
  Filled 2017-04-19: qty 500

## 2017-04-19 MED ORDER — ROCURONIUM BROMIDE 10 MG/ML (PF) SYRINGE
PREFILLED_SYRINGE | INTRAVENOUS | Status: AC
  Filled 2017-04-19: qty 5

## 2017-04-19 MED ORDER — MORPHINE SULFATE (PF) 4 MG/ML IV SOLN
2.0000 mg | INTRAVENOUS | Status: DC | PRN
  Administered 2017-04-21 (×2): 4 mg via INTRAVENOUS
  Administered 2017-04-21: 3 mg via INTRAVENOUS
  Filled 2017-04-19 (×4): qty 1

## 2017-04-19 MED ORDER — FENTANYL CITRATE (PF) 250 MCG/5ML IJ SOLN
INTRAMUSCULAR | Status: AC
  Filled 2017-04-19: qty 25

## 2017-04-19 MED ORDER — SODIUM CHLORIDE 0.9 % IV SOLN
0.0000 ug/min | INTRAVENOUS | Status: DC
  Administered 2017-04-19: 100 ug/min via INTRAVENOUS
  Filled 2017-04-19 (×2): qty 2

## 2017-04-19 MED ORDER — ASPIRIN EC 325 MG PO TBEC: 325.0000 mg | DELAYED_RELEASE_TABLET | Freq: Every day | ORAL | Status: DC

## 2017-04-19 MED ORDER — OXYCODONE HCL 5 MG PO TABS: 5.0000 mg | ORAL_TABLET | ORAL | Status: DC | PRN

## 2017-04-19 MED ORDER — POTASSIUM CHLORIDE 10 MEQ/50ML IV SOLN
10.0000 meq | INTRAVENOUS | Status: AC | PRN
  Administered 2017-04-19 – 2017-04-20 (×3): 10 meq via INTRAVENOUS

## 2017-04-19 MED ORDER — PHENYLEPHRINE HCL 10 MG/ML IJ SOLN
INTRAVENOUS | Status: DC | PRN
  Administered 2017-04-19: 20 ug/min via INTRAVENOUS

## 2017-04-19 MED ORDER — FENTANYL CITRATE (PF) 250 MCG/5ML IJ SOLN
INTRAMUSCULAR | Status: DC | PRN
  Administered 2017-04-19: 100 ug via INTRAVENOUS
  Administered 2017-04-19: 50 ug via INTRAVENOUS
  Administered 2017-04-19: 1100 ug via INTRAVENOUS

## 2017-04-19 MED ORDER — ATORVASTATIN CALCIUM 80 MG PO TABS: 80.0000 mg | ORAL_TABLET | Freq: Every day | ORAL | Status: DC

## 2017-04-19 MED ORDER — VANCOMYCIN HCL IN DEXTROSE 1-5 GM/200ML-% IV SOLN
1000.0000 mg | Freq: Once | INTRAVENOUS | Status: AC
  Administered 2017-04-19: 1000 mg via INTRAVENOUS
  Filled 2017-04-19: qty 200

## 2017-04-19 MED ORDER — LIDOCAINE 2% (20 MG/ML) 5 ML SYRINGE
INTRAMUSCULAR | Status: DC | PRN
  Administered 2017-04-19: 100 mg via INTRAVENOUS

## 2017-04-19 MED ORDER — ALBUMIN HUMAN 5 % IV SOLN
INTRAVENOUS | Status: DC | PRN
  Administered 2017-04-19 (×2): via INTRAVENOUS

## 2017-04-19 MED ORDER — NITROGLYCERIN IN D5W 200-5 MCG/ML-% IV SOLN
0.0000 ug/min | INTRAVENOUS | Status: DC
  Administered 2017-04-20: 20 ug/min via INTRAVENOUS
  Filled 2017-04-19: qty 250

## 2017-04-19 MED ORDER — DEXTROSE 5 % IV SOLN
1.5000 g | Freq: Two times a day (BID) | INTRAVENOUS | Status: DC
  Administered 2017-04-19 – 2017-04-20 (×3): 1.5 g via INTRAVENOUS
  Filled 2017-04-19 (×4): qty 1.5

## 2017-04-19 MED FILL — Sodium Bicarbonate IV Soln 8.4%: INTRAVENOUS | Qty: 50 | Status: AC

## 2017-04-19 MED FILL — Albumin, Human Inj 5%: INTRAVENOUS | Qty: 250 | Status: AC

## 2017-04-19 MED FILL — Lidocaine HCl IV Inj 20 MG/ML: INTRAVENOUS | Qty: 5 | Status: AC

## 2017-04-19 MED FILL — Electrolyte-R (PH 7.4) Solution: INTRAVENOUS | Qty: 5000 | Status: AC

## 2017-04-19 MED FILL — Mannitol IV Soln 20%: INTRAVENOUS | Qty: 500 | Status: AC

## 2017-04-19 MED FILL — Heparin Sodium (Porcine) Inj 1000 Unit/ML: INTRAMUSCULAR | Qty: 10 | Status: AC

## 2017-04-19 MED FILL — Sodium Chloride IV Soln 0.9%: INTRAVENOUS | Qty: 2000 | Status: AC

## 2017-04-19 SURGICAL SUPPLY — 141 items
ADAPTER CARDIO PERF ANTE/RETRO (ADAPTER) ×4 IMPLANT
ATRICLIP EXCLUSION 40 STD HAND (Clip) ×4 IMPLANT
BAG DECANTER FOR FLEXI CONT (MISCELLANEOUS) ×4 IMPLANT
BANDAGE ACE 4X5 VEL STRL LF (GAUZE/BANDAGES/DRESSINGS) ×4 IMPLANT
BANDAGE ACE 6X5 VEL STRL LF (GAUZE/BANDAGES/DRESSINGS) ×4 IMPLANT
BASKET HEART (ORDER IN 25'S) (MISCELLANEOUS) ×1
BASKET HEART (ORDER IN 25S) (MISCELLANEOUS) ×3 IMPLANT
BLADE STERNUM SYSTEM 6 (BLADE) ×4 IMPLANT
BLADE SURG 15 STRL LF DISP TIS (BLADE) ×3 IMPLANT
BLADE SURG 15 STRL SS (BLADE) ×1
BNDG GAUZE ELAST 4 BULKY (GAUZE/BANDAGES/DRESSINGS) ×4 IMPLANT
CANISTER SUCT 3000ML PPV (MISCELLANEOUS) ×4 IMPLANT
CANN PRFSN 3/8XRT ANG TPR 14 (MISCELLANEOUS) ×3
CANNULA ARTERIAL NVNT 3/8 22FR (MISCELLANEOUS) IMPLANT
CANNULA EZ GLIDE AORTIC 21FR (CANNULA) ×4 IMPLANT
CANNULA GUNDRY RCSP 15FR (MISCELLANEOUS) IMPLANT
CANNULA PRFSN 3/8XRT ANG TPR14 (MISCELLANEOUS) ×3 IMPLANT
CANNULA SUMP PERICARDIAL (CANNULA) ×4 IMPLANT
CANNULA VEN MTL TIP RT (MISCELLANEOUS) ×1
CANNULA VRC MALB SNGL STG 36FR (MISCELLANEOUS) ×3 IMPLANT
CARDIOBLATE CARDIAC ABLATION (MISCELLANEOUS)
CATH CPB KIT HENDRICKSON (MISCELLANEOUS) ×4 IMPLANT
CATH ROBINSON RED A/P 18FR (CATHETERS) ×12 IMPLANT
CATH THORACIC 36FR (CATHETERS) IMPLANT
CATH THORACIC 36FR RT ANG (CATHETERS) ×4 IMPLANT
CLAMP ISOLATOR SYNERGY LG (MISCELLANEOUS) ×4 IMPLANT
CLIP FOGARTY SPRING 6M (CLIP) IMPLANT
CLIP VESOCCLUDE MED 24/CT (CLIP) IMPLANT
CLIP VESOCCLUDE SM WIDE 24/CT (CLIP) ×8 IMPLANT
CONN 1/2X1/2X1/2  BEN (MISCELLANEOUS) ×2
CONN 1/2X1/2X1/2 BEN (MISCELLANEOUS) ×6 IMPLANT
CONN 3/8X1/2 ST GISH (MISCELLANEOUS) ×16 IMPLANT
CONN ST 1/4X3/8  BEN (MISCELLANEOUS) ×1
CONN ST 1/4X3/8 BEN (MISCELLANEOUS) ×3 IMPLANT
CONN Y 3/8X3/8X3/8  BEN (MISCELLANEOUS)
CONN Y 3/8X3/8X3/8 BEN (MISCELLANEOUS) IMPLANT
CONT SPEC 4OZ CLIKSEAL STRL BL (MISCELLANEOUS) ×4 IMPLANT
COUNTER NEEDLE 20 DBL MAG RED (NEEDLE) ×4 IMPLANT
CRADLE DONUT ADULT HEAD (MISCELLANEOUS) ×4 IMPLANT
DEVICE CARDIOBLATE CARDIAC ABL (MISCELLANEOUS) IMPLANT
DRAPE CARDIOVASCULAR INCISE (DRAPES) ×1
DRAPE SLUSH/WARMER DISC (DRAPES) ×4 IMPLANT
DRAPE SRG 135X102X78XABS (DRAPES) ×3 IMPLANT
DRSG COVADERM 4X14 (GAUZE/BANDAGES/DRESSINGS) ×4 IMPLANT
ELECT CAUTERY BLADE 6.4 (BLADE) IMPLANT
ELECT REM PT RETURN 9FT ADLT (ELECTROSURGICAL) ×8
ELECTRODE REM PT RTRN 9FT ADLT (ELECTROSURGICAL) ×6 IMPLANT
FELT TEFLON 1X6 (MISCELLANEOUS) ×4 IMPLANT
GAUZE SPONGE 4X4 12PLY STRL (GAUZE/BANDAGES/DRESSINGS) ×8 IMPLANT
GLOVE BIO SURGEON STRL SZ 6 (GLOVE) ×8 IMPLANT
GLOVE BIO SURGEON STRL SZ 6.5 (GLOVE) ×8 IMPLANT
GLOVE BIO SURGEON STRL SZ7 (GLOVE) ×24 IMPLANT
GLOVE BIO SURGEON STRL SZ7.5 (GLOVE) ×8 IMPLANT
GLOVE SURG SIGNA 7.5 PF LTX (GLOVE) ×12 IMPLANT
GOWN STRL REUS W/ TWL LRG LVL3 (GOWN DISPOSABLE) ×24 IMPLANT
GOWN STRL REUS W/ TWL XL LVL3 (GOWN DISPOSABLE) ×6 IMPLANT
GOWN STRL REUS W/TWL LRG LVL3 (GOWN DISPOSABLE) ×8
GOWN STRL REUS W/TWL XL LVL3 (GOWN DISPOSABLE) ×2
HEMOSTAT POWDER SURGIFOAM 1G (HEMOSTASIS) ×12 IMPLANT
HEMOSTAT SURGICEL 2X14 (HEMOSTASIS) ×4 IMPLANT
INSERT FOGARTY XLG (MISCELLANEOUS) IMPLANT
KIT BASIN OR (CUSTOM PROCEDURE TRAY) ×4 IMPLANT
KIT ROOM TURNOVER OR (KITS) ×4 IMPLANT
KIT SUCTION CATH 14FR (SUCTIONS) ×4 IMPLANT
KIT VASOVIEW HEMOPRO VH 3000 (KITS) ×4 IMPLANT
LINE VENT (MISCELLANEOUS) ×8 IMPLANT
LOOP VESSEL SUPERMAXI WHITE (MISCELLANEOUS) ×4 IMPLANT
MARKER GRAFT CORONARY BYPASS (MISCELLANEOUS) ×12 IMPLANT
NS IRRIG 1000ML POUR BTL (IV SOLUTION) ×24 IMPLANT
PACK OPEN HEART (CUSTOM PROCEDURE TRAY) ×4 IMPLANT
PAD ARMBOARD 7.5X6 YLW CONV (MISCELLANEOUS) ×8 IMPLANT
PAD ELECT DEFIB RADIOL ZOLL (MISCELLANEOUS) ×4 IMPLANT
PENCIL BUTTON HOLSTER BLD 10FT (ELECTRODE) ×4 IMPLANT
PROBE CRYO2-ABLATION MALLABLE (MISCELLANEOUS) ×4 IMPLANT
PUNCH AORTIC ROTATE  4.5MM 8IN (MISCELLANEOUS) ×4 IMPLANT
PUNCH AORTIC ROTATE 4.0MM (MISCELLANEOUS) IMPLANT
PUNCH AORTIC ROTATE 4.5MM 8IN (MISCELLANEOUS) IMPLANT
PUNCH AORTIC ROTATE 5MM 8IN (MISCELLANEOUS) IMPLANT
RING ANLPLS CARP-MC-ADAMS 28 (Prosthesis & Implant Heart) ×3 IMPLANT
RING ANNULOPLASTY (Prosthesis & Implant Heart) ×1 IMPLANT
SET CARDIOPLEGIA MPS 5001102 (MISCELLANEOUS) ×4 IMPLANT
SPONGE LAP 18X18 X RAY DECT (DISPOSABLE) ×8 IMPLANT
SUCKER WEIGHTED FLEX (MISCELLANEOUS) ×4 IMPLANT
SUT BONE WAX W31G (SUTURE) ×4 IMPLANT
SUT ETHIBOND (SUTURE) ×8 IMPLANT
SUT ETHIBOND 2 0 SH (SUTURE) ×13 IMPLANT
SUT ETHIBOND 2 0 SH 36X2 (SUTURE) ×27 IMPLANT
SUT ETHIBOND 2 0 V4 (SUTURE) IMPLANT
SUT ETHIBOND 2 0V4 GREEN (SUTURE) IMPLANT
SUT ETHIBOND 2-0 RB-1 WHT (SUTURE) ×8 IMPLANT
SUT MNCRL AB 4-0 PS2 18 (SUTURE) IMPLANT
SUT PROLENE 3 0 SH DA (SUTURE) ×24 IMPLANT
SUT PROLENE 3 0 SH1 36 (SUTURE) IMPLANT
SUT PROLENE 4 0 RB 1 (SUTURE) ×11
SUT PROLENE 4 0 SH DA (SUTURE) ×20 IMPLANT
SUT PROLENE 4-0 RB1 .5 CRCL 36 (SUTURE) ×33 IMPLANT
SUT PROLENE 5 0 C 1 36 (SUTURE) ×16 IMPLANT
SUT PROLENE 5 0 CC1 (SUTURE) IMPLANT
SUT PROLENE 6 0 C 1 30 (SUTURE) ×32 IMPLANT
SUT PROLENE 7 0 BV1 MDA (SUTURE) ×8 IMPLANT
SUT PROLENE 8 0 BV175 6 (SUTURE) ×20 IMPLANT
SUT SILK  1 MH (SUTURE) ×6
SUT SILK 1 MH (SUTURE) ×18 IMPLANT
SUT SILK 1 TIES 10X30 (SUTURE) ×8 IMPLANT
SUT SILK 2 0 (SUTURE) ×1
SUT SILK 2 0 SH CR/8 (SUTURE) ×16 IMPLANT
SUT SILK 2 0 TIES 10X30 (SUTURE) ×4 IMPLANT
SUT SILK 2 0 TIES 17X18 (SUTURE) ×1
SUT SILK 2-0 18XBRD TIE 12 (SUTURE) ×3 IMPLANT
SUT SILK 2-0 18XBRD TIE BLK (SUTURE) ×3 IMPLANT
SUT SILK 3 0 SH CR/8 (SUTURE) ×8 IMPLANT
SUT SILK 4 0 (SUTURE) ×1
SUT SILK 4 0 TIE 10X30 (SUTURE) ×8 IMPLANT
SUT SILK 4-0 18XBRD TIE 12 (SUTURE) ×3 IMPLANT
SUT STEEL 6MS V (SUTURE) ×4 IMPLANT
SUT STEEL STERNAL CCS#1 18IN (SUTURE) IMPLANT
SUT STEEL SZ 6 DBL 3X14 BALL (SUTURE) ×4 IMPLANT
SUT TEM PAC WIRE 2 0 SH (SUTURE) ×32 IMPLANT
SUT VIC AB 1 CTX 36 (SUTURE) ×3
SUT VIC AB 1 CTX36XBRD ANBCTR (SUTURE) ×9 IMPLANT
SUT VIC AB 2-0 CT1 27 (SUTURE) ×1
SUT VIC AB 2-0 CT1 TAPERPNT 27 (SUTURE) ×3 IMPLANT
SUT VIC AB 2-0 CTX 27 (SUTURE) ×16 IMPLANT
SUT VIC AB 3-0 SH 27 (SUTURE)
SUT VIC AB 3-0 SH 27X BRD (SUTURE) IMPLANT
SUT VIC AB 3-0 X1 27 (SUTURE) ×20 IMPLANT
SUT VICRYL 4-0 PS2 18IN ABS (SUTURE) IMPLANT
SUTURE E-PAK OPEN HEART (SUTURE) IMPLANT
SYSTEM SAHARA CHEST DRAIN ATS (WOUND CARE) ×4 IMPLANT
TAPE CLOTH SURG 4X10 WHT LF (GAUZE/BANDAGES/DRESSINGS) ×4 IMPLANT
TAPE PAPER 2X10 WHT MICROPORE (GAUZE/BANDAGES/DRESSINGS) ×4 IMPLANT
TOWEL GREEN STERILE (TOWEL DISPOSABLE) IMPLANT
TOWEL GREEN STERILE FF (TOWEL DISPOSABLE) IMPLANT
TOWEL OR 17X24 6PK STRL BLUE (TOWEL DISPOSABLE) IMPLANT
TOWEL OR 17X26 10 PK STRL BLUE (TOWEL DISPOSABLE) ×8 IMPLANT
TRAY FOLEY SILVER 16FR TEMP (SET/KITS/TRAYS/PACK) ×4 IMPLANT
TUBE FEEDING 8FR 16IN STR KANG (MISCELLANEOUS) ×4 IMPLANT
TUBING INSUFFLATION (TUBING) ×4 IMPLANT
UNDERPAD 30X30 (UNDERPADS AND DIAPERS) ×4 IMPLANT
VRC MALLEABLE SINGLE STG 36FR (MISCELLANEOUS) ×4
WATER STERILE IRR 1000ML POUR (IV SOLUTION) ×8 IMPLANT

## 2017-04-19 NOTE — Anesthesia Postprocedure Evaluation (Signed)
Anesthesia Post Note  Patient: Christy Campbell  Procedure(s) Performed: CORONARY ARTERY BYPASS GRAFTING (CABG) x four , using left internal mammary artery and right leg greater saphenous vein harvested endoscopically (N/A Chest) MAZE (N/A ) MITRAL VALVE REPAIR (MVR) (N/A ) TRANSESOPHAGEAL ECHOCARDIOGRAM (TEE) (N/A ) CLIPPING OF ATRIAL APPENDAGE (Chest)     Patient location during evaluation: PACU Anesthesia Type: General Level of consciousness: awake and alert Pain management: pain level controlled Vital Signs Assessment: post-procedure vital signs reviewed and stable Respiratory status: spontaneous breathing, nonlabored ventilation, respiratory function stable and patient connected to nasal cannula oxygen Cardiovascular status: blood pressure returned to baseline and stable Postop Assessment: no apparent nausea or vomiting Anesthetic complications: no    Last Vitals:  Vitals:   May 05, 2017 1606 05-May-2017 1615  BP:    Pulse:  89  Resp:  12  Temp:  (!) 35.8 C  SpO2: 97% 98%    Last Pain:  Vitals:   May 05, 2017 1615  TempSrc: Core (Comment)                 [name] DAVID

## 2017-04-19 NOTE — Anesthesia Procedure Notes (Addendum)
Arterial Line Insertion Start/End10/01/2017 7:00 AM, 04/19/2017 7:10 AM Performed by: Waynard Edwards, CRNA  Patient location: Pre-op. Preanesthetic checklist: patient identified, IV checked, site marked, risks and benefits discussed, surgical consent, monitors and equipment checked, pre-op evaluation, timeout performed and anesthesia consent Lidocaine 1% used for infiltration and patient sedated Left, radial was placed Catheter size: 20 G Hand hygiene performed , maximum sterile barriers used  and Seldinger technique used Allen's test indicative of satisfactory collateral circulation Attempts: 2 Procedure performed without using ultrasound guided technique. Following insertion, dressing applied and Biopatch. Post procedure assessment: normal and unchanged  Patient tolerated the procedure well with no immediate complications. Additional procedure comments: Discussed dopplers with anesthesiologist prior to insertion.Marland Kitchen

## 2017-04-19 NOTE — Progress Notes (Signed)
Echocardiogram Echocardiogram Transesophageal has been performed.  Pieter Partridge 05-11-17, 9:06 AM

## 2017-04-19 NOTE — OR Nursing (Signed)
14:00 - 20 minute call to SICU charge nurse 

## 2017-04-19 NOTE — Transfer of Care (Signed)
Immediate Anesthesia Transfer of Care Note  Patient: Christy Campbell  Procedure(s) Performed: CORONARY ARTERY BYPASS GRAFTING (CABG) x four , using left internal mammary artery and right leg greater saphenous vein harvested endoscopically (N/A Chest) MAZE (N/A ) MITRAL VALVE REPAIR (MVR) (N/A ) TRANSESOPHAGEAL ECHOCARDIOGRAM (TEE) (N/A ) CLIPPING OF ATRIAL APPENDAGE (Chest)  Patient Location: 2 heart ICU  Anesthesia Type:General  Level of Consciousness: sedated, unresponsive and Patient remains intubated per anesthesia plan  Airway & Oxygen Therapy: Patient remains intubated per anesthesia plan and Patient placed on Ventilator (see vital sign flow sheet for setting)  Post-op Assessment: Report given to RN and Post -op Vital signs reviewed and stable  Post vital signs: Reviewed and stable  Last Vitals:  Vitals:   04/24/2017 0613 05/19/2017 1433  BP: (!) 180/115 103/65  Pulse: 90 90  Resp: 18 12  Temp: 37 C   SpO2: 92% (!) 86%    Last Pain:  Vitals:   04/28/2017 0613  TempSrc: Oral      Patients Stated Pain Goal: 3 (05/04/2017 0557)  Complications: No apparent anesthesia complications

## 2017-04-19 NOTE — Anesthesia Procedure Notes (Signed)
Anesthesia Procedure Image    

## 2017-04-19 NOTE — Anesthesia Preprocedure Evaluation (Signed)
Anesthesia Evaluation  Patient identified by MRN, date of birth, ID band Patient awake    Reviewed: Allergy & Precautions, NPO status , Patient's Chart, lab work & pertinent test results  Airway Mallampati: II  TM Distance: >3 FB Neck ROM: Full    Dental   Pulmonary former smoker,    Pulmonary exam normal        Cardiovascular hypertension, Pt. on medications + CAD and + Past MI  Normal cardiovascular exam+ dysrhythmias Atrial Fibrillation      Neuro/Psych    GI/Hepatic GERD  Medicated and Controlled,  Endo/Other    Renal/GU Renal InsufficiencyRenal disease     Musculoskeletal   Abdominal   Peds  Hematology   Anesthesia Other Findings   Reproductive/Obstetrics                             Anesthesia Physical Anesthesia Plan  ASA: III  Anesthesia Plan: General   Post-op Pain Management:    Induction: Intravenous  PONV Risk Score and Plan: 3 and Ondansetron, Dexamethasone and Midazolam  Airway Management Planned: Oral ETT  Additional Equipment: Arterial line, CVP, PA Cath, TEE, 3D TEE and Ultrasound Guidance Line Placement  Intra-op Plan:   Post-operative Plan: Post-operative intubation/ventilation  Informed Consent: I have reviewed the patients History and Physical, chart, labs and discussed the procedure including the risks, benefits and alternatives for the proposed anesthesia with the patient or authorized representative who has indicated his/her understanding and acceptance.     Plan Discussed with: CRNA and Surgeon  Anesthesia Plan Comments:         Anesthesia Quick Evaluation

## 2017-04-19 NOTE — Anesthesia Procedure Notes (Signed)
Central Venous Catheter Insertion Performed by: Eilene Ghazi, anesthesiologist Start/End10/16/2018 7:00 AM, 04/19/2017 7:17 AM Patient location: Pre-op. Preanesthetic checklist: patient identified, IV checked, site marked, risks and benefits discussed, surgical consent, monitors and equipment checked, pre-op evaluation, timeout performed and anesthesia consent Position: Trendelenburg Lidocaine 1% used for infiltration and patient sedated Hand hygiene performed  and maximum sterile barriers used  Catheter size: 8.5 Fr PA cath was placed.Sheath introducer Swan type:thermodilution PA Cath depth:45 Procedure performed using ultrasound guided technique. Ultrasound Notes:anatomy identified, needle tip was noted to be adjacent to the nerve/plexus identified, no ultrasound evidence of intravascular and/or intraneural injection and image(s) printed for medical record Attempts: 1 Following insertion, line sutured, dressing applied and Biopatch. Post procedure assessment: blood return through all ports  Patient tolerated the procedure well with no immediate complications.

## 2017-04-19 NOTE — OR Nursing (Signed)
13:30 - 45 minute call to SICU charge nurse 

## 2017-04-19 NOTE — Plan of Care (Signed)
Problem: Tissue Perfusion: Goal: Risk of venous thrombosis will decrease Outcome: Progressing SCDs were placed on non-surgical leg after surgery.   Problem: Urinary Elimination: Goal: Ability to achieve and maintain adequate renal perfusion and functioning will improve Outcome: Progressing Pt has been able to maintain at least 30cc/hour in the immediate postoperative period.

## 2017-04-19 NOTE — Progress Notes (Signed)
Recruitment maneuver initiated on post op Open Heart pt per Dr Hendrickson's request. Pt spo2 86%. Pt was placed in PC with a set pressure of 30, peep of 8 R 10, I-time 3.0 for 2 minutes. VS remained within normal limits throughout with no complications. Pt placed back on FS at 14:40 with no complications. Spo2 improved to 100%. RT will continue to monitor.

## 2017-04-19 NOTE — Progress Notes (Signed)
CT surgery p.m. Rounds  Patient stable still sedated although Precedex being slowly weaned PO2 61 on FiO2 50% Chest tube drainage initially 200-3 100 cc/h now less than 50 cc/h Cardiac index 2.0 AV paced for bradycardia Continue to wean vent as safely tolerates

## 2017-04-19 NOTE — Anesthesia Procedure Notes (Signed)
Procedure Name: Intubation Date/Time: 05/15/2017 7:54 AM Performed by: Annabelle Harman A Pre-anesthesia Checklist: Patient identified, Emergency Drugs available, Suction available and Patient being monitored Patient Re-evaluated:Patient Re-evaluated prior to induction Oxygen Delivery Method: Circle system utilized Preoxygenation: Pre-oxygenation with 100% oxygen Induction Type: IV induction Ventilation: Mask ventilation without difficulty and Oral airway inserted - appropriate to patient size Laryngoscope Size: Hyacinth Meeker and 2 Grade View: Grade I Tube type: Oral Tube size: 7.5 mm Number of attempts: 1 Airway Equipment and Method: Stylet Placement Confirmation: ETT inserted through vocal cords under direct vision,  positive ETCO2 and breath sounds checked- equal and bilateral Secured at: 23 cm Tube secured with: Tape Dental Injury: Teeth and Oropharynx as per pre-operative assessment

## 2017-04-19 NOTE — Brief Op Note (Addendum)
04/22/2017  12:14 PM  PATIENT:  Christy Campbell  76 y.o. 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, moderate TR, atrial fibrillation  PROCEDURE:  Procedure(s): CORONARY ARTERY BYPASS GRAFTING (CABG) x , using left internal mammary artery and right leg greater saphenous vein harvested endoscopically (N/A) MAZE (N/A) MITRAL VALVE REPAIR (MVR) (N/A) TRANSESOPHAGEAL ECHOCARDIOGRAM (TEE) (N/A) CLIPPING OF ATRIAL APPENDAGE LIMA-LAD SEQ SVG-OM1-OM3 SVG-DIAG  SURGEON:  Surgeon(s) and Role:    * Loreli Slot, MD - Primary  ASSISTANTS: WAYNE GOLD PA-C  ANESTHESIA:   general  EBL:  Total I/O In: 1250 [I.V.:1250] Out: 1100 [Urine:1100]  BLOOD ADMINISTERED:none  DRAINS: 3 CHEST TUBES   LOCAL MEDICATIONS USED:  NONE  SPECIMEN:  No Specimen  DISPOSITION OF SPECIMEN:  N/A  COUNTS:  YES  TOURNIQUET:  * No tourniquets in log *  DICTATION: .Other Dictation: Dictation Number PENDING  PLAN OF CARE: Admit to inpatient   PATIENT DISPOSITION:  ICU - intubated and hemodynamically stable.   Delay start of Pharmacological VTE agent (>24hrs) due to surgical blood loss or risk of bleeding: yes  COMPLICATIONS: NO KNOWN  XC= 152 min CPB= 200 min  Good conduits, good targets Severe MR and moderate TR prebypass, No MR, moderate TR postbypass

## 2017-04-20 ENCOUNTER — Inpatient Hospital Stay (HOSPITAL_COMMUNITY): Payer: Medicare HMO

## 2017-04-20 ENCOUNTER — Other Ambulatory Visit: Payer: Self-pay

## 2017-04-20 ENCOUNTER — Encounter (HOSPITAL_COMMUNITY): Payer: Self-pay | Admitting: Thoracic Surgery (Cardiothoracic Vascular Surgery)

## 2017-04-20 LAB — GLUCOSE, CAPILLARY
GLUCOSE-CAPILLARY: 101 mg/dL — AB (ref 65–99)
GLUCOSE-CAPILLARY: 110 mg/dL — AB (ref 65–99)
GLUCOSE-CAPILLARY: 110 mg/dL — AB (ref 65–99)
GLUCOSE-CAPILLARY: 112 mg/dL — AB (ref 65–99)
GLUCOSE-CAPILLARY: 113 mg/dL — AB (ref 65–99)
GLUCOSE-CAPILLARY: 118 mg/dL — AB (ref 65–99)
GLUCOSE-CAPILLARY: 120 mg/dL — AB (ref 65–99)
GLUCOSE-CAPILLARY: 123 mg/dL — AB (ref 65–99)
GLUCOSE-CAPILLARY: 130 mg/dL — AB (ref 65–99)
GLUCOSE-CAPILLARY: 130 mg/dL — AB (ref 65–99)
GLUCOSE-CAPILLARY: 151 mg/dL — AB (ref 65–99)
Glucose-Capillary: 106 mg/dL — ABNORMAL HIGH (ref 65–99)
Glucose-Capillary: 112 mg/dL — ABNORMAL HIGH (ref 65–99)
Glucose-Capillary: 114 mg/dL — ABNORMAL HIGH (ref 65–99)
Glucose-Capillary: 114 mg/dL — ABNORMAL HIGH (ref 65–99)
Glucose-Capillary: 116 mg/dL — ABNORMAL HIGH (ref 65–99)
Glucose-Capillary: 120 mg/dL — ABNORMAL HIGH (ref 65–99)
Glucose-Capillary: 123 mg/dL — ABNORMAL HIGH (ref 65–99)
Glucose-Capillary: 125 mg/dL — ABNORMAL HIGH (ref 65–99)
Glucose-Capillary: 129 mg/dL — ABNORMAL HIGH (ref 65–99)
Glucose-Capillary: 131 mg/dL — ABNORMAL HIGH (ref 65–99)
Glucose-Capillary: 135 mg/dL — ABNORMAL HIGH (ref 65–99)
Glucose-Capillary: 141 mg/dL — ABNORMAL HIGH (ref 65–99)
Glucose-Capillary: 150 mg/dL — ABNORMAL HIGH (ref 65–99)
Glucose-Capillary: 61 mg/dL — ABNORMAL LOW (ref 65–99)

## 2017-04-20 LAB — POCT I-STAT, CHEM 8
BUN: 21 mg/dL — AB (ref 6–20)
BUN: 23 mg/dL — AB (ref 6–20)
CALCIUM ION: 1.16 mmol/L (ref 1.15–1.40)
CHLORIDE: 105 mmol/L (ref 101–111)
CHLORIDE: 107 mmol/L (ref 101–111)
Calcium, Ion: 1.17 mmol/L (ref 1.15–1.40)
Creatinine, Ser: 1 mg/dL (ref 0.44–1.00)
Creatinine, Ser: 1.1 mg/dL — ABNORMAL HIGH (ref 0.44–1.00)
GLUCOSE: 121 mg/dL — AB (ref 65–99)
Glucose, Bld: 156 mg/dL — ABNORMAL HIGH (ref 65–99)
HCT: 21 % — ABNORMAL LOW (ref 36.0–46.0)
HEMATOCRIT: 20 % — AB (ref 36.0–46.0)
Hemoglobin: 6.8 g/dL — CL (ref 12.0–15.0)
Hemoglobin: 7.1 g/dL — ABNORMAL LOW (ref 12.0–15.0)
POTASSIUM: 3.3 mmol/L — AB (ref 3.5–5.1)
POTASSIUM: 4.1 mmol/L (ref 3.5–5.1)
SODIUM: 142 mmol/L (ref 135–145)
Sodium: 141 mmol/L (ref 135–145)
TCO2: 20 mmol/L — ABNORMAL LOW (ref 22–32)
TCO2: 20 mmol/L — ABNORMAL LOW (ref 22–32)

## 2017-04-20 LAB — POCT I-STAT 3, ART BLOOD GAS (G3+)
ACID-BASE DEFICIT: 5 mmol/L — AB (ref 0.0–2.0)
Bicarbonate: 18.6 mmol/L — ABNORMAL LOW (ref 20.0–28.0)
O2 Saturation: 96 %
PH ART: 7.435 (ref 7.350–7.450)
PO2 ART: 80 mmHg — AB (ref 83.0–108.0)
Patient temperature: 37.1
TCO2: 19 mmol/L — ABNORMAL LOW (ref 22–32)
pCO2 arterial: 27.8 mmHg — ABNORMAL LOW (ref 32.0–48.0)

## 2017-04-20 LAB — BASIC METABOLIC PANEL
ANION GAP: 10 (ref 5–15)
Anion gap: 11 (ref 5–15)
BUN: 22 mg/dL — AB (ref 6–20)
BUN: 24 mg/dL — ABNORMAL HIGH (ref 6–20)
CHLORIDE: 110 mmol/L (ref 101–111)
CHLORIDE: 110 mmol/L (ref 101–111)
CO2: 19 mmol/L — AB (ref 22–32)
CO2: 20 mmol/L — ABNORMAL LOW (ref 22–32)
CREATININE: 1.18 mg/dL — AB (ref 0.44–1.00)
Calcium: 8.5 mg/dL — ABNORMAL LOW (ref 8.9–10.3)
Calcium: 8.6 mg/dL — ABNORMAL LOW (ref 8.9–10.3)
Creatinine, Ser: 1.19 mg/dL — ABNORMAL HIGH (ref 0.44–1.00)
GFR calc Af Amer: 51 mL/min — ABNORMAL LOW (ref >60)
GFR calc Af Amer: 50 mL/min — ABNORMAL LOW (ref >60)
GFR calc non Af Amer: 44 mL/min — ABNORMAL LOW (ref >60)
GFR, EST NON AFRICAN AMERICAN: 44 mL/min — AB (ref >60)
GLUCOSE: 118 mg/dL — AB (ref 65–99)
Glucose, Bld: 104 mg/dL — ABNORMAL HIGH (ref 65–99)
POTASSIUM: 3.2 mmol/L — AB (ref 3.5–5.1)
POTASSIUM: 4.2 mmol/L (ref 3.5–5.1)
Sodium: 140 mmol/L (ref 135–145)
Sodium: 140 mmol/L (ref 135–145)

## 2017-04-20 LAB — CBC
HCT: 22.2 % — ABNORMAL LOW (ref 36.0–46.0)
HEMATOCRIT: 24.6 % — AB (ref 36.0–46.0)
Hemoglobin: 8.3 g/dL — ABNORMAL LOW (ref 12.0–15.0)
Hemoglobin: 7.5 g/dL — ABNORMAL LOW (ref 12.0–15.0)
MCH: 27.2 pg (ref 26.0–34.0)
MCH: 27 pg (ref 26.0–34.0)
MCHC: 33.7 g/dL (ref 30.0–36.0)
MCHC: 33.8 g/dL (ref 30.0–36.0)
MCV: 80.7 fL (ref 78.0–100.0)
MCV: 79.9 fL (ref 78.0–100.0)
PLATELETS: 124 10*3/uL — AB (ref 150–400)
Platelets: 114 10*3/uL — ABNORMAL LOW (ref 150–400)
RBC: 3.05 MIL/uL — ABNORMAL LOW (ref 3.87–5.11)
RBC: 2.78 MIL/uL — AB (ref 3.87–5.11)
RDW: 16.2 % — AB (ref 11.5–15.5)
RDW: 16.1 % — AB (ref 11.5–15.5)
WBC: 17.3 10*3/uL — AB (ref 4.0–10.5)
WBC: 22.5 10*3/uL — AB (ref 4.0–10.5)

## 2017-04-20 LAB — PREPARE PLATELET PHERESIS: Unit division: 0

## 2017-04-20 LAB — PREPARE FRESH FROZEN PLASMA: Unit division: 0

## 2017-04-20 LAB — MAGNESIUM
MAGNESIUM: 2.4 mg/dL (ref 1.7–2.4)
Magnesium: 2.2 mg/dL (ref 1.7–2.4)

## 2017-04-20 LAB — BPAM PLATELET PHERESIS
Blood Product Expiration Date: 201810042359
Unit Type and Rh: 7300

## 2017-04-20 LAB — COOXEMETRY PANEL
CARBOXYHEMOGLOBIN: 1.2 % (ref 0.5–1.5)
METHEMOGLOBIN: 1.2 % (ref 0.0–1.5)
O2 Saturation: 62.2 %
TOTAL HEMOGLOBIN: 8.3 g/dL — AB (ref 12.0–16.0)

## 2017-04-20 LAB — BPAM FFP
Blood Product Expiration Date: 201810062359
Unit Type and Rh: 5100

## 2017-04-20 LAB — PREPARE RBC (CROSSMATCH)

## 2017-04-20 MED ORDER — FUROSEMIDE 10 MG/ML IJ SOLN
10.0000 mg/h | INTRAVENOUS | Status: DC
  Administered 2017-04-20: 8 mg/h via INTRAVENOUS
  Filled 2017-04-20 (×2): qty 25

## 2017-04-20 MED ORDER — POTASSIUM CHLORIDE 10 MEQ/50ML IV SOLN
10.0000 meq | INTRAVENOUS | Status: AC | PRN
  Administered 2017-04-20 (×3): 10 meq via INTRAVENOUS
  Filled 2017-04-20 (×3): qty 50

## 2017-04-20 MED ORDER — AMIODARONE HCL IN DEXTROSE 360-4.14 MG/200ML-% IV SOLN
60.0000 mg/h | INTRAVENOUS | Status: AC
Start: 2017-04-20 — End: 2017-04-21
  Administered 2017-04-20 – 2017-04-21 (×2): 60 mg/h via INTRAVENOUS
  Filled 2017-04-20 (×2): qty 200

## 2017-04-20 MED ORDER — SODIUM CHLORIDE 0.9 % IV SOLN
Freq: Once | INTRAVENOUS | Status: DC
Start: 2017-04-20 — End: 2017-04-21

## 2017-04-20 MED ORDER — ENOXAPARIN SODIUM 40 MG/0.4ML ~~LOC~~ SOLN
40.0000 mg | SUBCUTANEOUS | Status: DC
  Administered 2017-04-20: 40 mg via SUBCUTANEOUS
  Filled 2017-04-20: qty 0.4

## 2017-04-20 MED ORDER — SODIUM CHLORIDE 0.9% FLUSH: 10.0000 mL | INTRAVENOUS | Status: DC | PRN

## 2017-04-20 MED ORDER — POTASSIUM CHLORIDE 10 MEQ/50ML IV SOLN
10.0000 meq | INTRAVENOUS | Status: AC
  Administered 2017-04-20 (×3): 10 meq via INTRAVENOUS
  Filled 2017-04-20 (×3): qty 50

## 2017-04-20 MED ORDER — ENOXAPARIN SODIUM 40 MG/0.4ML ~~LOC~~ SOLN: 40.0000 mg | SUBCUTANEOUS | Status: DC

## 2017-04-20 MED ORDER — SODIUM CHLORIDE 0.9% FLUSH
10.0000 mL | Freq: Two times a day (BID) | INTRAVENOUS | Status: DC
  Administered 2017-04-20 (×2): 10 mL

## 2017-04-20 MED ORDER — DOPAMINE-DEXTROSE 3.2-5 MG/ML-% IV SOLN: 3.0000 ug/kg/min | INTRAVENOUS | Status: DC

## 2017-04-20 MED ORDER — AMIODARONE HCL IN DEXTROSE 360-4.14 MG/200ML-% IV SOLN
30.0000 mg/h | INTRAVENOUS | Status: DC
  Filled 2017-04-20 (×2): qty 200

## 2017-04-20 MED ORDER — CHLORHEXIDINE GLUCONATE CLOTH 2 % EX PADS
6.0000 | MEDICATED_PAD | Freq: Every day | CUTANEOUS | Status: DC
  Administered 2017-04-21: 6 via TOPICAL

## 2017-04-20 NOTE — Progress Notes (Signed)
1 Day Post-Op Procedure(s) (LRB): CORONARY ARTERY BYPASS GRAFTING (CABG) x four , using left internal mammary artery and right leg greater saphenous vein harvested endoscopically (N/A) MAZE (N/A) MITRAL VALVE REPAIR (MVR) (N/A) TRANSESOPHAGEAL ECHOCARDIOGRAM (TEE) (N/A) CLIPPING OF ATRIAL APPENDAGE Subjective: intubated  Objective: Vital signs in last 24 hours: Temp:  [96.3 F (35.7 C)-99.3 F (37.4 C)] 99.3 F (37.4 C) (10/02 0700) Pulse Rate:  [36-93] 88 (10/02 0700) Cardiac Rhythm: A-V Sequential paced (10/01 2000) Resp:  [5-34] 16 (10/02 0700) BP: (96-153)/(55-95) 137/79 (10/02 0700) SpO2:  [86 %-100 %] 98 % (10/02 0700) Arterial Line BP: (75-207)/(46-70) 142/55 (10/02 0700) FiO2 (%):  [50 %-100 %] 50 % (10/02 0430) Weight:  [203 lb 11.3 oz (92.4 kg)] 203 lb 11.3 oz (92.4 kg) (10/02 0500)  Hemodynamic parameters for last 24 hours: PAP: (23-50)/(9-24) 31/14 CO:  [1.9 L/min-4.7 L/min] 4.7 L/min CI:  [1 L/min/m2-2.5 L/min/m2] 2.5 L/min/m2  Intake/Output from previous day: 10/01 0701 - 10/02 0700 In: 8408.3 [I.V.:4723.3; Blood:1010; IV Piggyback:2675] Out: 5838 [Urine:2403; Emesis/NG output:500; Blood:1530; Chest Tube:1405] Intake/Output this shift: No intake/output data recorded.  General appearance: intubated Neurologic: moving all 4 ext, opens eyes to voice not following commands Heart: irregularly irregular rhythm Lungs: rhonchi bilaterally Abdomen: normal findings: soft, non-tender  Lab Results:  Recent Labs  04-20-2017 1909  Apr 20, 2017 2208 04/20/17 0343  WBC 23.5*  --   --  17.3*  HGB 7.8*  < > 6.8* 8.3*  HCT 24.1*  < > 20.0* 24.6*  PLT 130*  --   --  114*  < > = values in this interval not displayed. BMET:  Recent Labs  20-Apr-2017 2208 04/20/17 0343  NA 141 140  K 3.3* 3.2*  CL 105 110  CO2  --  19*  GLUCOSE 156* 104*  BUN 21* 22*  CREATININE 1.00 1.18*  CALCIUM  --  8.5*    PT/INR:  Recent Labs  2017/04/20 1451  LABPROT 21.2*  INR 1.85    ABG    Component Value Date/Time   PHART 7.435 04/20/2017 0402   HCO3 18.6 (L) 04/20/2017 0402   TCO2 19 (L) 04/20/2017 0402   ACIDBASEDEF 5.0 (H) 04/20/2017 0402   O2SAT 96.0 04/20/2017 0402   CBG (last 3)   Recent Labs  04/20/17 0456 04/20/17 0600 04/20/17 0701  GLUCAP 112* 110* 135*    Assessment/Plan: S/P Procedure(s) (LRB): CORONARY ARTERY BYPASS GRAFTING (CABG) x four , using left internal mammary artery and right leg greater saphenous vein harvested endoscopically (N/A) MAZE (N/A) MITRAL VALVE REPAIR (MVR) (N/A) TRANSESOPHAGEAL ECHOCARDIOGRAM (TEE) (N/A) CLIPPING OF ATRIAL APPENDAGE -  NEURO- she is opening her eyes and moves all 4 ext, but not alert and following commands. Concern for possible stroke vs delirium. She is not a candidate for thrombolytics after surgery yesterday. Will follow for now and minimize sedation as much as possible  CV- good cardiac output on dopamine and milrinone. Co-ox= 62. Off levophed. Will wean dopamine to 3 mcg/kg/min for now. Continue milrinone today.   Resume plavix  Will need coumadin eventually. plan to restart tomorrow  RESP- Remains on vent. Down to 50% FiO2. Wait for mental status to improve before trying to wean  RENAL- creatinine up slightly  Hypokalemia- supplement K  Start lasix drip to diurese  ENDO- CBG well controlled but requiring relatively high doses of insulin-   Continue insulin drip + glucomander  Anemia- Hct= 8.3  Thrombocytopenia- PLT 114 K- follow  SCD + enoxaparin until INR > 2.0  LOS: 1 day    Christy Campbell 04/20/2017

## 2017-04-20 NOTE — Progress Notes (Signed)
Dr. Donata Clay updated on patients weaning status. Pt has not been able to follow commands throughout shift. She has become increasingly more responsive, but her right arm has had less responsiveness than the rest of her extremities. Pt has been able to turn head to appropriate side when name is called, responds to pain in left arm and bilateral legs, but has minimal response on the right arm. Pupils have been monitored throughout shift and have maintained being equal and reactive. Per Dr. Donata Clay, pt may need additional time to become more alert/responsive. No additional orders given. Will continue to monitor.

## 2017-04-20 NOTE — Progress Notes (Signed)
      [address]       [place] [address]             [phone]      Intubated, sedated, no change in mental status  In atrial fib rate in 90s- will try amiodarone although I think it is likely we ultimately just go with rate control.  BP 119/62   Pulse 96   Temp 99.9 F (37.7 C)   Resp (!) 22   Wt 203 lb 11.3 oz (92.4 kg)   SpO2 96%   BMI 32.88 kg/m    Intake/Output Summary (Last 24 hours) at 04/20/17 1921 Last data filed at 04/20/17 1800  Gross per 24 hour  Intake          3600.42 ml  Output             3510 ml  Net            90.42 ml   Hct= 22-will give 1 unit PRBC  Head CT pending  Trey Bebee C. Dorris Fetch, MD [facility] [phone]

## 2017-04-20 NOTE — H&P (Signed)
[address]       [name] [address]             [phone]               HPI: [name] returns today to discuss possible surgery.  03/25/2017 [name] to 76 year old woman with a history of hypertension, hyperlipidemia, tobacco abuse, peripheral vascular disease, arthritis, recent GI bleed, GERD and obesity. She presented with "light" heartburn on07/14/2018. She was having a ST elevation MI. She underwent emergent catheterization where she was found to have a totally occluded right coronary. She had significant disease in the left coronary system with an 90% LAD stenosis, a 95% stenosis in OM1 and total occlusion of the circumflex before OM 2 and 3 which filled via left to left collaterals. She had PTCA and stenting of the right coronary with a good result.  She was readmitted on 02/10/2017 with heart failure and a gastrointestinal bleed. Endoscopy showed no source of the GI bleed.  Her atrial fibrillation dates back to February of this year. She did have cardioversion at one point but went back into atrial fibrillation shortly thereafter. She has been managed with rate control and anticoagulation.  She complains of shortness of breath with exertion and says that her right leg gets tired when she walks about 10-15 minutes. It improves with rest.  04/06/2017 She has had her preoperative vascular evaluation. Her symptoms are unchanged in the interim       Past Medical History:  Diagnosis Date  . Anemia    "long time ago"  . Arthritis    "back; right knee and ankle"  . Dyslipidemia   . GERD (gastroesophageal reflux disease)   . H/O cardiovascular stress test 12/06/08   normal, no ischemia  . History of stomach ulcers 03/01/12   "little bitty ones"  . Hypertension    echo 12/06/08-EF>55%, mild AS and mild pulmonary htn  . Kidney stones   . PVD (peripheral vascular disease) (HCC) 12/29/2011   h/o right common iliac artery stent,  left SFA stent and left SFA directional atherectomy 02/29/12  . Tobacco abuse    Quit 01/30/2017          Current Outpatient Prescriptions  Medication Sig Dispense Refill  . acetaminophen (TYLENOL) 325 MG tablet Take 325 mg by mouth 2 (two) times daily as needed. For pain    . atorvastatin (LIPITOR) 80 MG tablet Take 1 tablet (80 mg total) by mouth daily at 6 PM. 30 tablet 6  . clopidogrel (PLAVIX) 75 MG tablet Take 1 tablet (75 mg total) by mouth daily. 30 tablet 6  . diltiazem (CARDIZEM CD) 240 MG 24 hr capsule Take 1 capsule (240 mg total) by mouth daily. 90 capsule 3  . furosemide (LASIX) 20 MG tablet Take 1 tablet (20 mg total) by mouth daily. Note dose change. 90 tablet 1  . HYDROcodone-homatropine (HYCODAN) 5-1.5 MG/5ML syrup Take 5 mLs by mouth every 6 (six) hours. 120 mL 0  . metoprolol tartrate (LOPRESSOR) 100 MG tablet Take 1 tablet (100 mg total) by mouth 2 (two) times daily. 60 tablet 6  . Multiple Vitamin (MULITIVITAMIN WITH MINERALS) TABS Take 1 tablet by mouth daily.    . nitroGLYCERIN (NITROSTAT) 0.4 MG SL tablet Place 1 tablet (0.4 mg total) under the tongue every 5 (five) minutes x 3 doses as needed for chest pain. 25 tablet 12  . Omega-3 Fatty Acids (FISH OIL PEARLS) 300 MG CAPS  Take 300 mg by mouth daily.    . pantoprazole (PROTONIX) 40 MG tablet Take 1 tablet (40 mg total) by mouth 2 (two) times daily. 60 tablet 0  . Rivaroxaban (XARELTO) 20 MG TABS tablet Take 1 tablet (20 mg total) by mouth daily with supper. Note dose change. 90 tablet 0  . vitamin C (ASCORBIC ACID) 500 MG tablet Take 1,000 mg by mouth daily.      No current facility-administered medications for this visit.     Physical Exam BP (!) 160/95   Pulse 65   Resp 20   Ht  (1.676 m)   Wt 182 lb (82.6 kg)   SpO2 98% Comment: RA  BMI 29.38 kg/m  Obese 76 year old woman in no acute distress Alert and oriented 3 with no focal deficits Cardiac irregularly irregular, no murmur Lungs  diminished at bases no rales or wheezing Extremities multiple varicosities, no edema  Diagnostic Tests: Echocardiogram 02/11/2017 Study Conclusions  - Left ventricle: The cavity size was normal. There was mild concentric hypertrophy. Systolic function was moderately to severely reduced. The estimated ejection fraction was in the range of 30% to 35%. Moderate diffuse hypokinesis with distinct regional wall motion abnormalities. Severe hypokinesis of the basal-midinferolateral, inferior, and inferoseptal myocardium. - Mitral valve: There was mild to moderate regurgitation directed centrally. - Left atrium: The atrium was moderately to severely dilated. - Right ventricle: The cavity size was mildly dilated. Systolic function was moderately reduced. - Right atrium: The atrium was mildly dilated. - Tricuspid valve: There was moderate-severe regurgitation directed centrally. - Pulmonary arteries: Systolic pressure was moderately increased. PA peak pressure: 55 mm Hg (S).  Impressions:  - Image quality has deteriorated compared to February 02, 2017 study. Nevertheless, overall LV systolic function appears a little worse and there is more evident RV dysfunction. Findings are consistent with extensive ischemia/nfarction in the right coronary artery distribution, with RV involvement.  Cardiac catheterization 01/30/2017 Conclusion     Ost LAD to Prox LAD lesion, 90 %stenosed.  Prox LAD to Mid LAD lesion, 85 %stenosed.  Prox Cx to Mid Cx lesion, 100 %stenosed.  Ost 1st Mrg to 1st Mrg lesion, 95 %stenosed.  There is mild left ventricular systolic dysfunction.  LV end diastolic pressure is normal.  The left ventricular ejection fraction is 45-50% by visual estimate.  A STENT VISION RX [id number] bare metal stent was successfully placed.  Mid RCA lesion, 100 %stenosed.  Post intervention, there is a 0% residual stenosis.  1. Severe 3 vessel  obstructive CAD - 90% focal proximal LAD - 85% segmental LAD following the first diagonal - 95% ostial first OM - 100% mid LCx. Left to left collaterals to OM2 and OM3 - 100% mid RCA- this is the culprit lesion 2. Mild LV dysfunction with inferior wall motion abnormality. 3. Normal LVEDP 4. Successful stenting of the mid RCA with DES  Plan: would continue DAPT for one month. Resume IV heparin for Afib. May resume Xarelto tomorrow pm if no complications. Given advanced 3 vessel disease I would recommend CABG once she has recovered from her infarct in 4-6 weeks. Aggressive risk factor modification.   Noninvasive vascular studies 03/30/2017 Carotid Findings:  Findings consistent with less than 39 percent stenosis involving the right internal carotid artery and the left internal carotid artery. The vertebral arteries demonstrate antegrade flow.  Upper Extremity Right Left  Brachial Pressures 167 Triphasic 170 Triphasic  Radial Waveforms Triphasic Triphasic  Ulnar Waveforms Triphasic Triphasic  Palmar Arch (Allen's Test)  Palmar waveforms are obliterated with radial and ulnar compression. Palmar waveforms are obliterated with radial compression and are diminished greater than fifty percent with ulnar compression.   Lower Extremity Right Left  Dorsalis Pedis 146 135  Posterior Tibial 145 161  Ankle/Brachial Indices 0.86 0.95   Findings:  Right ABI of 0.86 is suggestive of mild arterial occlusive disease at rest. Left ABI of 0.95 is suggestive of arterial flow within normal limits at rest.   BilateralLower Extremity Vein Map  Right Great Saphenous Vein  Segment Diameter Comment  1. Origin 4.5 mm   2. High Thigh 3.9 mm Branch  3. Mid Thigh 3.7 mm Branch  4. Low Thigh 2.8 mm Branch  5. At Knee 2.4 mm Branch  6. High Calf 2.6 mm   7. Low Calf 2.4 mm Branch  8. Ankle 2.3 mm Branch   LeftGreat Saphenous Vein  Segment Diameter Comment  1. Origin  4.7 mm Branch  2. High Thigh 4.3 mm Branch  3. Mid Thigh 3.1 mm Branch  4. Low Thigh 1.7 mm   5. At Knee 2.3 mm   6. High Calf 1.7 mm   7. Low Calf 1.8 mm Branch  8. Ankle 1.9 mm    09/11/183:47 PM  I personally reviewed the catheterization and echocardiographic images and concur with the findings noted above  Impression: [name] 76 year old woman with three-vessel coronary disease status post PTCA and stenting of her right coronary after presenting with an ST elevation MI in July. Her right coronary was essentially angiographically normal post stent. She has residual severe disease in the LAD and circumflex distributions that is not amenable to percutaneous intervention. She continues to have shortness of breath with exertion but is not having any of the vague chest discomfort she had when she presented with her MI. I suspect there is an anginal component to her shortness of breath, but there certainly are other factors that may be contributing including tobacco abuse and morbid obesity. She did quit smoking when she had her MI.  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 She's not diagnosed with diabetes so potentially we can use bilateral mammaries. Pain mapping does indicate there may be some usable vein although we may have to harvest both eyes 2 obtain enough vein to complete the operation.  I do think the benefit of coronary bypass grafting outweighs the risk and recommend that we proceed. They do understand there is a higher than normal risk of incomplete revascularization.  We also discussed doing a Maze procedure for atrial fibrillation at the time of surgery. She has had issues with GI bleeding and could ultimately have problems with maintaining anticoagulation. She understands that Maze procedure has about an 85% effectiveness and there is no guarantee we can completely eliminate her atrial fibrillation.  We also  discussed the need to reevaluate the mitral and tricuspid valves with intraoperative transesophageal echocardiography. One or both of those valves may need repair.  I again reviewed the general nature of the procedure including the incisions to be used, the need for cardiopulmonary bypass, use of drainage tubes postoperatively, the incisions to be used, the expected hospital stay, and the overall recovery, which may be prolonged. They understand the general rate some success with these procedures. I reviewed the indications, risks, benefits, and alternatives. They understand the risks include, but are not limited to death, MI, DVT, PE, stroke, bleeding, possible need for transfusion, infection, complete heart block requiring  pacemaker, cardiac arrhythmias, respiratory or renal failure, as well as the possibility of other unforeseeable complications.  She accepts the risk and wishes to proceed  Plan: Coronary artery bypass grafting, Maze procedure, possible mitral and/or tricuspid repair on Monday 04/28/2017  She will stop Plavix after her dose on September 26 and Xarelto after her dose on September 28  Loreli Slot, MD [facility] [phone]       Electronically signed by Loreli Slot, MD at 04/06/2017 1:02 PM      Office Visit on 04/06/2017        Detailed Report

## 2017-04-20 NOTE — Op Note (Signed)
Christy Campbell, Christy Campbell             ACCOUNT NO.:  [id number]  MEDICAL RECORD NO.:  [id number]  LOCATION:  [facility]                         FACILITY:  [facility]  PHYSICIAN:  [name]. Jesus Nevills, M.D.DATE OF BIRTH:  Jan 30, 1941  DATE OF PROCEDURE:  04/22/2017 DATE OF DISCHARGE:                              OPERATIVE REPORT   PREOPERATIVE DIAGNOSIS:  Two-vessel coronary artery disease with moderately severe mitral regurgitation, moderate tricuspid regurgitation, and atrial fibrillation.  POSTOPERATIVE DIAGNOSIS:  Two-vessel coronary artery disease with moderately severe mitral regurgitation, moderate tricuspid regurgitation, and atrial fibrillation.  PROCEDURES:  Median sternotomy, extracorporeal circulation, coronary artery bypass grafting x4 (left internal mammary artery to left anterior descending, saphenous vein graft to first diagonal, sequential saphenous vein graft to obtuse marginals 1 and 3), mitral valve repair with Kem Boroughs 28-mm annuloplasty ring (model 4100, serial #[id number]), left and right-sided maze procedure using radiofrequency and cryoablation, left atrial appendage clipping, endoscopic vein harvest, right leg.  SURGEON:  [name]. Dorris Fetch, M.D.  ASSISTANT:  Rowe Clack, PA-C.  ANESTHESIA:  General.  FINDINGS:  Prebypass transesophageal echocardiography showed severe mitral regurgitation with possible A2 prolapse, moderate tricuspid regurgitation, moderate left ventricular dysfunction with ejection fraction approximately 40%  INTRAOPERATIVE FINDINGS:  Good quality targets, good quality conduits, adhesions in the left pleural space, sternal osteoporosis.  No definite prolapse of the anterior leaflet.  No residual mitral regurgitation post repair.  Post-bypass transesophageal echocardiography showed a good result with a mitral valve repair with no residual regurgitation, no change in the moderate tricuspid regurgitation, septal dyskinesis due to  pacing.  CLINICAL NOTE:  Christy Campbell is a 76 year old woman, who suffered a myocardial infarction back in July.  She presented with an ST-elevation MI.  She underwent emergent catheterization where she was found to have a totally occluded right coronary artery.  She had PTCA and stenting of her right coronary artery with a good result.  She had significant residual disease in the LAD and circumflex.  She has atrial fibrillation dating back to February 2018, managed with rate control and anticoagulation, but has had gastrointestinal bleed.  She was advised to undergo coronary artery bypass grafting and maze procedure.  It was also felt likely that she would need mitral and/or tricuspid valve repair. The indications, risks, benefits, and alternatives were discussed in detail with the patient.  She understood and accepted the risks and agreed to proceed.  OPERATIVE NOTE:  Christy Campbell was brought to the preoperative holding area on April 19, 2017.  Anesthesia placed a Swan-Ganz catheter and arterial blood pressure monitoring line.  She was taken to the operating room, anesthetized, and intubated.  Transesophageal echocardiography was performed, findings as noted above.  Intravenous antibiotics were administered.  The Foley catheter was placed.  The chest, abdomen, and legs were prepped and draped in usual sterile fashion.  An incision was made on the medial aspect of the right leg at the level of the knee. The greater saphenous vein was harvested from the upper calf to the groin endoscopically.  It was a good quality conduit.  Simultaneously, a median sternotomy was performed.  There was sternal osteoporosis.  The left internal mammary artery was harvested using standard technique.  It had excellent flow distally when divided.  2000 units of heparin was administered during the vessel harvest.  The remainder of the full heparin dose was given prior to opening the pericardium.  After  harvesting the conduits, the pericardium was opened.  There was a moderate pericardial effusion.  The ascending aorta was inspected. There was no evidence of atherosclerotic disease.  After confirming adequate anticoagulation with ACT measurement, the aorta was cannulated via concentric 2-0 Ethibond pledgeted pursestring sutures.  A 31-French right angle venous cannula was placed via pursestring suture in the superior vena cava.  Cardiopulmonary bypass was initiated.  A 36-French venous cannula was placed into the inferior vena cava via pursestring suture in the right atrium.  This was connected to the bypass circuit and full flows were maintained.  The patient was cooled to 28 degrees Celsius.  The coronary arteries were inspected and anastomotic sites were chosen.  OM-1 and 3 appeared suitable for grafting, but OM-2 was too small to graft.  The LAD and diagonal appeared to be good quality target vessels.  A retrograde cardioplegia cannula was placed via pursestring suture in the right atrium and directed in the coronary sinus.  An antegrade cardioplegia cannula was placed in the ascending aorta.  A temperature probe was placed in myocardial septum, and a cardioplegia cannula was placed in the ascending aorta.  Carbon dioxide was insufflated into the operative field.  The aorta was crossclamped.  The left ventricle was emptied via the aortic root vent.  Cardiac arrest was achieved with a combination of cold antegrade and retrograde blood cardioplegia and topical iced saline.  An initial 500 mL of cardioplegia was given antegrade.  There was a rapid diastolic arrest.  350 mL of cardioplegia was administered retrograde, and there was septal cooling to 9 degrees Celsius. Additional doses of cardioplegia were administered at the completion of each vein graft as well as at the completion of the mammary graft and at 20-minute intervals during the open portion of the procedure.  A reversed  saphenous vein graft was placed sequentially to obtuse marginals 1 and 3.  These were both good quality target vessels.  OM-3 was slightly larger, but OM-1 did also accept a 1.5-mm probe.  The vein was good quality.  A side-to-side anastomosis was performed to OM-1 and end-to-side OM-3.  Both were done with running 7-0 Prolene sutures.  All anastomoses were probed proximally and distally at the completion. Cardioplegia was administered down the vein graft.  There were good flow and good hemostasis at both anastomoses.  Next, a reversed saphenous vein graft was placed end-to-side to the first diagonal branch of the LAD.  This was a 1.5-mm good quality target.  The vein was anastomosed end-to-side with a running 7-0 Prolene suture.  A probe passed easily proximally and distally.  There were good flow and hemostasis with cardioplegia administration.  The left internal mammary artery was brought through a window in the pericardium.  The distal end was beveled and it was anastomosed end-to- side to the distal LAD.  Both of these were 1.5-mm vessels.  An end-to- side anastomosis was performed with a running 8-0 Prolene suture.  At completion anastomosis, the bulldog clamp was removed.  Septal rewarming was noted.  The bulldog clamp was replaced.  The mammary pedicle was tacked to the epicardial surface of the heart with 6-0 Prolene sutures.  Additional cardioplegia was administered.  It should be noted that radiofrequency ablation was performed on the left-sided pulmonary veins  and the base of the left atrial appendage using radiofrequency ablation prior to cross-clamp in the aorta.  The interatrial groove was dissected out, and the left atrium was opened.  Isolation of the right-sided pulmonary veins was completed with parallel radiofrequency ablation lines on the left atrium just proximal to the pulmonary vein insertion.  An atrial retractor was placed and the mitral valve was inspected.   Careful inspection of leaflets revealed some redundancy in A2, but no clear prolapse.  The anterior leaflet sized for a 28-mm McCarthy-Adams annuloplasty ring.  2-0 Ethibond horizontal mattress sutures were placed in the anulus circumferentially. A total of 12 sutures were utilized.  The commissural sutures were also sized for a 28-mm ring.  The sutures were placed through the ring.  It was lowered and the sutures were sequentially tied.  After completing the annuloplasty, the left ventricle was distended with iced saline and there was no leakage.  Therefore, no leaflet resection was performed. The left-sided maze was completed with a cryoablation line running from the right pulmonary vein ablation lines to the 4 o'clock position on the mitral anulus and an ablation line connecting the left-sided pulmonary veins to the base of the left atrial appendage.  The left atriotomy was closed.  Systemic rewarming was begun.  The atriotomy was closed with 2 layers of running 4-0 Prolene suture, the first with horizontal mattress followed by running simple suture.  The tip of the right atrial appendage was removed and a right atriotomy was performed.  Radiofrequency was used to create ablation lines to the insertion of the superior and inferior vena cavae.  Again, parallel lines were placed.  The cryoprobe was used to create a lesion from the right atrial appendage to the tricuspid anulus and then also lesion connecting the inferior vena caval ablation line to the coronary sinus. The tip of the right atrial appendage was oversewn and the right atriotomy was closed again with 2 layers of running 4-0 Prolene suture. De-airing was performed just as it was prior to closing the left atrium.  The 40-mm AtriCure clip was placed across the base of the left atrial appendage.  The vein grafts were cut to length.  The cardioplegia cannula was removed in the ascending aorta.  The proximal vein graft  anastomoses were performed to 4.5 mm punch aortotomies with running 6-0 Prolene sutures.  At completion of the final proximal anastomosis, the patient was placed in Trendelenburg position.  Lidocaine was administered. Additional de-airing maneuvers were performed and the aortic crossclamp was removed.  The total crossclamp time was 152 minutes.  The patient initially fibrillated after defibrillation, was in heart block.  While rewarming was completed, all proximal and distal anastomoses as well as the atriotomies were inspected for hemostasis.  Epicardial pacing wires were placed on the right ventricle and right atrium and DDD pacing was initiated.  The retrograde cardioplegia cannula was removed.  The superior vena cava cannula was placed into the right atrium.  The inferior vena cava cannula was removed.  There was bleeding from tear in the atrium in this area that required 3-0 Prolene horizontal mattress sutures with pledgets.  A dopamine infusion was initiated at 5 mcg/kg/min.  When the patient had rewarmed to a core temperature of 37 degrees Celsius, she was weaned from cardiopulmonary bypass on the first attempt.  Total bypass time was 200 minutes.  The initial cardiac index was less than 2 L/min/m2, but did improve overtime with volume administration and increase in dopamine  to 8 mcg/kg/min.  Inspection of the mitral valve repair with transesophageal echo showed a good result with no residual mitral regurgitation.  There was some septal dyskinesis due to pacing.  Otherwise, left ventricular function was unchanged.  A test dose of protamine was administered and it was well tolerated.  The superior vena cava cannula and the aortic cannula were removed.  The remainder of the protamine was administered without incident.  The chest was irrigated with warm saline.  Hemostasis was achieved.  A left pleural tube and 2 mediastinal chest tubes were placed through separate subcostal  incisions.  The pericardium came together easily and it was reapproximated with interrupted 3-0 silk sutures.  The sternum was closed with a combination of single and double heavy gauge stainless steel wires. The pectoralis fascia, subcutaneous tissue, and skin were closed in a standard fashion.  All sponge, needle, and instrument counts were correct at the end of the procedure.  The patient was taken from the operating room to the Surgical Intensive Care Unit intubated and in fair condition.     [name] Dorris Fetch, M.D.     SCH/MEDQ  D:  04/23/2017  T:  04/20/2017  Job:  [id number]

## 2017-04-20 NOTE — Progress Notes (Signed)
[address]       ,[place] [address]             [phone]      Met with family at bedside  She is moving and opens eyes to voice. She does move all 4 ext but still doesn't follow commands.   BP (!) 146/50   Pulse 63   Temp 99.7 F (37.6 C)   Resp (!) 28   Wt 203 lb 11.3 oz (92.4 kg)   SpO2 96%   BMI 32.88 kg/m    Intake/Output Summary (Last 24 hours) at 04/20/17 1606 Last data filed at 04/20/17 1600  Gross per 24 hour  Intake          4857.02 ml  Output             3990 ml  Net           867.02 ml   Good cardiac output and hemodynamics  Will go ahead and send her down for head CT without contrast.  Revonda Standard. Roxan Hockey, MD [facility] [phone]

## 2017-04-20 NOTE — Plan of Care (Signed)
Problem: Activity: Goal: Risk for activity intolerance will decrease Outcome: Not Progressing Due to patient still being on ventilator, unable to follow commands, she is unable to progress her activity.   Problem: Respiratory: Goal: Ability to tolerate decreased levels of ventilator support will improve Outcome: Not Progressing Pt is unable to follow commands and maintain alertness to wean/attempt to extubate. She remains on ventilator.   Problem: Urinary Elimination: Goal: Ability to achieve and maintain adequate renal perfusion and functioning will improve Outcome: Progressing Pt continues to make adequate urine.

## 2017-04-21 ENCOUNTER — Inpatient Hospital Stay (HOSPITAL_COMMUNITY): Payer: Medicare HMO

## 2017-04-21 DIAGNOSIS — R6521 Severe sepsis with septic shock: Secondary | ICD-10-CM

## 2017-04-21 DIAGNOSIS — A419 Sepsis, unspecified organism: Secondary | ICD-10-CM

## 2017-04-21 DIAGNOSIS — Z951 Presence of aortocoronary bypass graft: Secondary | ICD-10-CM

## 2017-04-21 DIAGNOSIS — J80 Acute respiratory distress syndrome: Secondary | ICD-10-CM

## 2017-04-21 DIAGNOSIS — I469 Cardiac arrest, cause unspecified: Secondary | ICD-10-CM

## 2017-04-21 DIAGNOSIS — N179 Acute kidney failure, unspecified: Secondary | ICD-10-CM

## 2017-04-21 DIAGNOSIS — I361 Nonrheumatic tricuspid (valve) insufficiency: Secondary | ICD-10-CM

## 2017-04-21 DIAGNOSIS — J189 Pneumonia, unspecified organism: Secondary | ICD-10-CM

## 2017-04-21 DIAGNOSIS — J9601 Acute respiratory failure with hypoxia: Secondary | ICD-10-CM

## 2017-04-21 LAB — POCT I-STAT 3, ART BLOOD GAS (G3+)
ACID-BASE DEFICIT: 4 mmol/L — AB (ref 0.0–2.0)
ACID-BASE DEFICIT: 3 mmol/L — AB (ref 0.0–2.0)
ACID-BASE DEFICIT: 11 mmol/L — AB (ref 0.0–2.0)
ACID-BASE DEFICIT: 13 mmol/L — AB (ref 0.0–2.0)
ACID-BASE DEFICIT: 11 mmol/L — AB (ref 0.0–2.0)
Acid-base deficit: 11 mmol/L — ABNORMAL HIGH (ref 0.0–2.0)
Acid-base deficit: 11 mmol/L — ABNORMAL HIGH (ref 0.0–2.0)
BICARBONATE: 18 mmol/L — AB (ref 20.0–28.0)
BICARBONATE: 16.8 mmol/L — AB (ref 20.0–28.0)
BICARBONATE: 17.2 mmol/L — AB (ref 20.0–28.0)
BICARBONATE: 18.1 mmol/L — AB (ref 20.0–28.0)
BICARBONATE: 18.5 mmol/L — AB (ref 20.0–28.0)
Bicarbonate: 18.9 mmol/L — ABNORMAL LOW (ref 20.0–28.0)
Bicarbonate: 14.1 mmol/L — ABNORMAL LOW (ref 20.0–28.0)
O2 SAT: 88 %
O2 SAT: 64 %
O2 SAT: 82 %
O2 Saturation: 95 %
O2 Saturation: 80 %
O2 Saturation: 71 %
O2 Saturation: 88 %
PCO2 ART: 31.3 mmHg — AB (ref 32.0–48.0)
PCO2 ART: 65.8 mmHg — AB (ref 32.0–48.0)
PCO2 ART: 62.1 mmHg — AB (ref 32.0–48.0)
PCO2 ART: 69.7 mmHg — AB (ref 32.0–48.0)
PH ART: 7.487 — AB (ref 7.350–7.450)
PH ART: 7.496 — AB (ref 7.350–7.450)
PH ART: 7.035 — AB (ref 7.350–7.450)
PH ART: 7.08 — AB (ref 7.350–7.450)
PO2 ART: 52 mmHg — AB (ref 83.0–108.0)
PO2 ART: 53 mmHg — AB (ref 83.0–108.0)
PO2 ART: 53 mmHg — AB (ref 83.0–108.0)
PO2 ART: 69 mmHg — AB (ref 83.0–108.0)
PO2 ART: 84 mmHg (ref 83.0–108.0)
Patient temperature: 38.4
Patient temperature: 37.6
Patient temperature: 38.1
TCO2: 19 mmol/L — ABNORMAL LOW (ref 22–32)
TCO2: 20 mmol/L — ABNORMAL LOW (ref 22–32)
TCO2: 15 mmol/L — ABNORMAL LOW (ref 22–32)
TCO2: 19 mmol/L — AB (ref 22–32)
TCO2: 19 mmol/L — ABNORMAL LOW (ref 22–32)
TCO2: 20 mmol/L — ABNORMAL LOW (ref 22–32)
TCO2: 21 mmol/L — ABNORMAL LOW (ref 22–32)
pCO2 arterial: 23.9 mmHg — ABNORMAL LOW (ref 32.0–48.0)
pCO2 arterial: 24.6 mmHg — ABNORMAL LOW (ref 32.0–48.0)
pCO2 arterial: 51 mmHg — ABNORMAL HIGH (ref 32.0–48.0)
pH, Arterial: 7.272 — ABNORMAL LOW (ref 7.350–7.450)
pH, Arterial: 7.025 — CL (ref 7.350–7.450)
pH, Arterial: 7.145 — CL (ref 7.350–7.450)
pO2, Arterial: 69 mmHg — ABNORMAL LOW (ref 83.0–108.0)
pO2, Arterial: 53 mmHg — ABNORMAL LOW (ref 83.0–108.0)

## 2017-04-21 LAB — CBC
HEMATOCRIT: 25.9 % — AB (ref 36.0–46.0)
Hemoglobin: 8.9 g/dL — ABNORMAL LOW (ref 12.0–15.0)
MCH: 27.4 pg (ref 26.0–34.0)
MCHC: 34.4 g/dL (ref 30.0–36.0)
MCV: 79.7 fL (ref 78.0–100.0)
PLATELETS: 98 10*3/uL — AB (ref 150–400)
RBC: 3.25 MIL/uL — ABNORMAL LOW (ref 3.87–5.11)
RDW: 16 % — AB (ref 11.5–15.5)
WBC: 11.5 10*3/uL — AB (ref 4.0–10.5)

## 2017-04-21 LAB — CORTISOL: CORTISOL PLASMA: 42.9 ug/dL

## 2017-04-21 LAB — COOXEMETRY PANEL
Carboxyhemoglobin: 0.9 % (ref 0.5–1.5)
Methemoglobin: 1 % (ref 0.0–1.5)
O2 SAT: 56 %
TOTAL HEMOGLOBIN: 9.1 g/dL — AB (ref 12.0–16.0)

## 2017-04-21 LAB — BASIC METABOLIC PANEL
ANION GAP: 12 (ref 5–15)
BUN: 27 mg/dL — ABNORMAL HIGH (ref 6–20)
CALCIUM: 8.4 mg/dL — AB (ref 8.9–10.3)
CO2: 19 mmol/L — ABNORMAL LOW (ref 22–32)
CREATININE: 1.33 mg/dL — AB (ref 0.44–1.00)
Chloride: 109 mmol/L (ref 101–111)
GFR, EST AFRICAN AMERICAN: 44 mL/min — AB (ref >60)
GFR, EST NON AFRICAN AMERICAN: 38 mL/min — AB (ref >60)
Glucose, Bld: 104 mg/dL — ABNORMAL HIGH (ref 65–99)
Potassium: 3.4 mmol/L — ABNORMAL LOW (ref 3.5–5.1)
SODIUM: 140 mmol/L (ref 135–145)

## 2017-04-21 LAB — PROCALCITONIN: PROCALCITONIN: 8.32 ng/mL

## 2017-04-21 LAB — GLUCOSE, CAPILLARY
GLUCOSE-CAPILLARY: 105 mg/dL — AB (ref 65–99)
GLUCOSE-CAPILLARY: 115 mg/dL — AB (ref 65–99)
GLUCOSE-CAPILLARY: 136 mg/dL — AB (ref 65–99)
GLUCOSE-CAPILLARY: 96 mg/dL (ref 65–99)
GLUCOSE-CAPILLARY: 97 mg/dL (ref 65–99)
Glucose-Capillary: 117 mg/dL — ABNORMAL HIGH (ref 65–99)
Glucose-Capillary: 118 mg/dL — ABNORMAL HIGH (ref 65–99)
Glucose-Capillary: 96 mg/dL (ref 65–99)
Glucose-Capillary: 114 mg/dL — ABNORMAL HIGH (ref 65–99)

## 2017-04-21 LAB — PREPARE RBC (CROSSMATCH)

## 2017-04-21 LAB — LACTIC ACID, PLASMA: Lactic Acid, Venous: 17.3 mmol/L (ref 0.5–1.9)

## 2017-04-21 MED ORDER — INSULIN DETEMIR 100 UNIT/ML ~~LOC~~ SOLN
15.0000 [IU] | Freq: Every day | SUBCUTANEOUS | Status: DC
  Administered 2017-04-21: 15 [IU] via SUBCUTANEOUS
  Filled 2017-04-21: qty 0.15

## 2017-04-21 MED ORDER — INSULIN ASPART 100 UNIT/ML ~~LOC~~ SOLN: 0.0000 [IU] | SUBCUTANEOUS | Status: DC

## 2017-04-21 MED ORDER — VECURONIUM BROMIDE 10 MG IV SOLR
INTRAVENOUS | Status: AC
  Administered 2017-04-21: 10 mg
  Filled 2017-04-21: qty 10

## 2017-04-21 MED ORDER — DEXTROSE 5 % IV SOLN
1.0000 g | Freq: Two times a day (BID) | INTRAVENOUS | Status: DC
  Filled 2017-04-21: qty 1

## 2017-04-21 MED ORDER — PRISMASOL BGK 4/2.5 32-4-2.5 MEQ/L IV SOLN
INTRAVENOUS | Status: DC
  Filled 2017-04-21 (×2): qty 5000

## 2017-04-21 MED ORDER — ALBUMIN HUMAN 5 % IV SOLN
INTRAVENOUS | Status: AC
  Administered 2017-04-21: 12.5 g
  Filled 2017-04-21: qty 500

## 2017-04-21 MED ORDER — SODIUM CHLORIDE 0.9 % FOR CRRT
INTRAVENOUS_CENTRAL | Status: DC | PRN
  Filled 2017-04-21: qty 1000

## 2017-04-21 MED ORDER — SODIUM CHLORIDE 0.9 % IV SOLN: Freq: Once | INTRAVENOUS | Status: DC

## 2017-04-21 MED ORDER — SODIUM BICARBONATE 8.4 % IV SOLN
INTRAVENOUS | Status: AC
  Filled 2017-04-21: qty 50

## 2017-04-21 MED ORDER — WARFARIN - PHYSICIAN DOSING INPATIENT: Freq: Every day | Status: DC

## 2017-04-21 MED ORDER — PRISMASOL BGK 4/2.5 32-4-2.5 MEQ/L IV SOLN
INTRAVENOUS | Status: DC
  Filled 2017-04-21 (×5): qty 5000

## 2017-04-21 MED ORDER — DEXMEDETOMIDINE HCL IN NACL 400 MCG/100ML IV SOLN
0.0000 ug/kg/h | INTRAVENOUS | Status: DC
  Administered 2017-04-21: 0.6 ug/kg/h via INTRAVENOUS
  Filled 2017-04-21: qty 100

## 2017-04-21 MED ORDER — POTASSIUM CHLORIDE 20 MEQ/15ML (10%) PO SOLN: 40.0000 meq | Freq: Three times a day (TID) | ORAL | Status: DC

## 2017-04-21 MED ORDER — SODIUM BICARBONATE 8.4 % IV SOLN
INTRAVENOUS | Status: AC
  Administered 2017-04-21: 50 meq
  Filled 2017-04-21: qty 50

## 2017-04-21 MED ORDER — CEFTAZIDIME 2 G IJ SOLR
2.0000 g | Freq: Two times a day (BID) | INTRAMUSCULAR | Status: DC
  Filled 2017-04-21 (×2): qty 2

## 2017-04-21 MED ORDER — HYDROCORTISONE NA SUCCINATE PF 100 MG IJ SOLR: 50.0000 mg | Freq: Four times a day (QID) | INTRAMUSCULAR | Status: DC

## 2017-04-21 MED ORDER — NOREPINEPHRINE BITARTRATE 1 MG/ML IV SOLN
0.0000 ug/min | INTRAVENOUS | Status: DC
  Administered 2017-04-21: 40 ug/min via INTRAVENOUS
  Filled 2017-04-21 (×2): qty 16

## 2017-04-21 MED ORDER — SODIUM CHLORIDE 0.9 % IV SOLN
0.0300 [IU]/min | INTRAVENOUS | Status: DC
  Administered 2017-04-21: 0.03 [IU]/min via INTRAVENOUS
  Filled 2017-04-21: qty 2

## 2017-04-21 MED ORDER — AMIODARONE IV BOLUS ONLY 150 MG/100ML
150.0000 mg | Freq: Once | INTRAVENOUS | Status: AC
  Administered 2017-04-21: 150 mg via INTRAVENOUS

## 2017-04-21 MED ORDER — EPINEPHRINE PF 1 MG/10ML IJ SOSY
PREFILLED_SYRINGE | INTRAMUSCULAR | Status: AC
  Filled 2017-04-21: qty 30

## 2017-04-21 MED ORDER — VANCOMYCIN HCL 10 G IV SOLR: 1250.0000 mg | INTRAVENOUS | Status: DC

## 2017-04-21 MED ORDER — CISATRACURIUM BOLUS VIA INFUSION
0.1000 mg/kg | Freq: Once | INTRAVENOUS | Status: DC
  Filled 2017-04-21: qty 9

## 2017-04-21 MED ORDER — POTASSIUM CHLORIDE 10 MEQ/50ML IV SOLN
10.0000 meq | INTRAVENOUS | Status: AC | PRN
  Administered 2017-04-21 (×3): 10 meq via INTRAVENOUS
  Filled 2017-04-21 (×3): qty 50

## 2017-04-21 MED ORDER — VANCOMYCIN HCL 10 G IV SOLR
1500.0000 mg | Freq: Once | INTRAVENOUS | Status: AC
  Administered 2017-04-21: 1500 mg via INTRAVENOUS
  Filled 2017-04-21: qty 1500

## 2017-04-21 MED ORDER — SODIUM BICARBONATE 8.4 % IV SOLN
50.0000 meq | Freq: Once | INTRAVENOUS | Status: AC
  Administered 2017-04-21: 50 meq via INTRAVENOUS

## 2017-04-21 MED ORDER — VECURONIUM BROMIDE 10 MG IV SOLR: 10.0000 mg | Freq: Once | INTRAVENOUS | Status: DC

## 2017-04-21 MED ORDER — SODIUM BICARBONATE 8.4 % IV SOLN
INTRAVENOUS | Status: AC
  Filled 2017-04-21: qty 100

## 2017-04-21 MED ORDER — POTASSIUM CHLORIDE 10 MEQ/50ML IV SOLN
INTRAVENOUS | Status: AC
  Administered 2017-04-21: 10 meq
  Filled 2017-04-21: qty 100

## 2017-04-21 MED ORDER — HEPARIN SODIUM (PORCINE) 1000 UNIT/ML DIALYSIS
1000.0000 [IU] | INTRAMUSCULAR | Status: DC | PRN
  Filled 2017-04-21 (×2): qty 6

## 2017-04-21 MED ORDER — SODIUM BICARBONATE 8.4 % IV SOLN
INTRAVENOUS | Status: DC
  Administered 2017-04-21: 11:00:00 via INTRAVENOUS
  Filled 2017-04-21 (×4): qty 150

## 2017-04-21 MED ORDER — VANCOMYCIN HCL IN DEXTROSE 1-5 GM/200ML-% IV SOLN
1000.0000 mg | INTRAVENOUS | Status: DC
  Filled 2017-04-21: qty 200

## 2017-04-21 MED ORDER — WARFARIN SODIUM 2.5 MG PO TABS: 2.5000 mg | ORAL_TABLET | Freq: Every day | ORAL | Status: DC

## 2017-04-21 MED ORDER — LEVALBUTEROL HCL 0.63 MG/3ML IN NEBU
0.6300 mg | INHALATION_SOLUTION | Freq: Four times a day (QID) | RESPIRATORY_TRACT | Status: DC
  Administered 2017-04-21: 0.63 mg via RESPIRATORY_TRACT
  Filled 2017-04-21: qty 3

## 2017-04-21 MED FILL — Medication: Qty: 1 | Status: AC

## 2017-04-22 LAB — TYPE AND SCREEN
ABO/RH(D): O POS
ANTIBODY SCREEN: NEGATIVE
UNIT DIVISION: 0
UNIT DIVISION: 0
UNIT DIVISION: 0
UNIT DIVISION: 0
UNIT DIVISION: 0
Unit division: 0
Unit division: 0

## 2017-04-22 LAB — BPAM RBC
BLOOD PRODUCT EXPIRATION DATE: 201810182359
BLOOD PRODUCT EXPIRATION DATE: 201810252359
Blood Product Expiration Date: 201810182359
Blood Product Expiration Date: 201810252359
Blood Product Expiration Date: 201810252359
Blood Product Expiration Date: 201810252359
Blood Product Expiration Date: 201810252359
ISSUE DATE / TIME: 201810022013
ISSUE DATE / TIME: 201810031119
ISSUE DATE / TIME: 201810012117
UNIT TYPE AND RH: 5100
UNIT TYPE AND RH: 5100
UNIT TYPE AND RH: 5100
Unit Type and Rh: 5100
Unit Type and Rh: 5100
Unit Type and Rh: 5100
Unit Type and Rh: 5100

## 2017-04-22 LAB — BLOOD CULTURE ID PANEL (REFLEXED)
Acinetobacter baumannii: NOT DETECTED
CANDIDA ALBICANS: NOT DETECTED
CANDIDA GLABRATA: NOT DETECTED
CANDIDA PARAPSILOSIS: NOT DETECTED
CANDIDA TROPICALIS: NOT DETECTED
Candida krusei: NOT DETECTED
Carbapenem resistance: NOT DETECTED
ESCHERICHIA COLI: NOT DETECTED
Enterobacter cloacae complex: NOT DETECTED
Enterobacteriaceae species: NOT DETECTED
Enterococcus species: NOT DETECTED
Haemophilus influenzae: NOT DETECTED
KLEBSIELLA OXYTOCA: NOT DETECTED
KLEBSIELLA PNEUMONIAE: NOT DETECTED
Listeria monocytogenes: NOT DETECTED
Methicillin resistance: NOT DETECTED
Neisseria meningitidis: NOT DETECTED
Proteus species: NOT DETECTED
Pseudomonas aeruginosa: DETECTED — AB
SERRATIA MARCESCENS: NOT DETECTED
STAPHYLOCOCCUS AUREUS BCID: NOT DETECTED
STREPTOCOCCUS PNEUMONIAE: NOT DETECTED
Staphylococcus species: NOT DETECTED
Streptococcus agalactiae: NOT DETECTED
Streptococcus pyogenes: NOT DETECTED
Streptococcus species: NOT DETECTED
Vancomycin resistance: NOT DETECTED

## 2017-04-24 LAB — CULTURE, BLOOD (ROUTINE X 2)
SPECIAL REQUESTS: ADEQUATE
Special Requests: ADEQUATE

## 2017-04-24 MED FILL — Heparin Sodium (Porcine) Inj 1000 Unit/ML: INTRAMUSCULAR | Qty: 30 | Status: AC

## 2017-04-24 MED FILL — Magnesium Sulfate Inj 50%: INTRAMUSCULAR | Qty: 10 | Status: AC

## 2017-04-24 MED FILL — Potassium Chloride Inj 2 mEq/ML: INTRAVENOUS | Qty: 40 | Status: AC

## 2017-05-07 ENCOUNTER — Ambulatory Visit: Payer: Medicare HMO | Admitting: Cardiovascular Disease

## 2017-05-20 NOTE — Procedures (Signed)
CPR  PEA arrest again, see sheet for drugs/action.  After 15 minutes of asystole, it was decided that patient has no reasonable chance at recovery since this is the 3rd arrest and now is persistently asystolic and acidotic.  No ROSC established and patient was declared.  Alyson Reedy, M.D. [facility] Pulmonary/Critical Care Medicine. Pager: [phone]. After hours pager: [phone].

## 2017-05-20 NOTE — Progress Notes (Signed)
2 Days Post-Op Procedure(s) (LRB): CORONARY ARTERY BYPASS GRAFTING (CABG) x four , using left internal mammary artery and right leg greater saphenous vein harvested endoscopically (N/A) MAZE (N/A) MITRAL VALVE REPAIR (MVR) (N/A) TRANSESOPHAGEAL ECHOCARDIOGRAM (TEE) (N/A) CLIPPING OF ATRIAL APPENDAGE Subjective: Intubated, sedated Increased O2 requirement overnight, more labored respiratory effort  Objective: Vital signs in last 24 hours: Temp:  [99 F (37.2 C)-100.9 F (38.3 C)] 100.8 F (38.2 C) (10/03 0700) Pulse Rate:  [57-168] 88 (10/03 0700) Cardiac Rhythm: Atrial fibrillation (10/02 2000) Resp:  [9-45] 23 (10/03 0700) BP: (87-189)/(45-131) 112/64 (10/03 0700) SpO2:  [86 %-100 %] 96 % (10/03 0700) Arterial Line BP: (82-216)/(1-80) 132/45 (10/03 0700) FiO2 (%):  [50 %] 50 % (10/03 0400) Weight:  [197 lb 1.5 oz (89.4 kg)] 197 lb 1.5 oz (89.4 kg) (10/03 0426)  Hemodynamic parameters for last 24 hours: PAP: (21-47)/(5-21) 42/6 CO:  [3.8 L/min-4.9 L/min] 3.9 L/min CI:  [2 L/min/m2-2.5 L/min/m2] 2.1 L/min/m2  Intake/Output from previous day: 10/02 0701 - 10/03 0700 In: 2668 [I.V.:1828; Blood:360; NG/GT:130; IV Piggyback:350] Out: 3640 [Urine:2565; Emesis/NG output:495; Chest Tube:580] Intake/Output this shift: No intake/output data recorded.  General appearance: intubated Neurologic: some minimal spontaneous movement, not following commands Heart: irregularly irregular rhythm Lungs: rhonchi faint bilateral Abdomen: normal findings: soft, non-tender  Lab Results:  Recent Labs  04/20/17 1556 04/20/17 1604 05/09/2017 0304  WBC 22.5*  --  11.5*  HGB 7.5* 7.1* 8.9*  HCT 22.2* 21.0* 25.9*  PLT 124*  --  98*   BMET:  Recent Labs  04/20/17 1556 04/20/17 1604 05/18/2017 0304  NA 140 142 140  K 4.2 4.1 3.4*  CL 110 107 109  CO2 20*  --  19*  GLUCOSE 118* 121* 104*  BUN 24* 23* 27*  CREATININE 1.19* 1.10* 1.33*  CALCIUM 8.6*  --  8.4*    PT/INR:  Recent Labs  05/14/2017 1451  LABPROT 21.2*  INR 1.85   ABG    Component Value Date/Time   PHART 7.496 (H) 04/24/2017 0542   HCO3 18.9 (L) 05/09/2017 0542   TCO2 20 (L) 05/18/2017 0542   ACIDBASEDEF 3.0 (H) 05/13/2017 0542   O2SAT 88.0 05/16/2017 0542   CBG (last 3)   Recent Labs  04/25/2017 0503 05/09/2017 0606 04/26/2017 0644  GLUCAP 96 97 118*    Assessment/Plan: S/P Procedure(s) (LRB): CORONARY ARTERY BYPASS GRAFTING (CABG) x four , using left internal mammary artery and right leg greater saphenous vein harvested endoscopically (N/A) MAZE (N/A) MITRAL VALVE REPAIR (MVR) (N/A) TRANSESOPHAGEAL ECHOCARDIOGRAM (TEE) (N/A) CLIPPING OF ATRIAL APPENDAGE -  NEURO- no improvement overnight. 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 MR not an option at this point. Will ask Neuro to see today  CV- cardiac index OK on milrinone 0.25 and dopamine 3  In atrial fibrillation- on amiodarone drip, will give bolus this AM due to HR  On plavix, will start low dose coumadin today  RESP- more labored breathing and decreased O2 sats overnight  CXR does not look significantly different.   Will have respiratory do recruitment  Continue vent support  Add levalbuterol  Culture sputum and start empiric vanco and ceftaz  RENAL- creatinine up slightly  Continue dopamine, increase lasix drip  Hypokalemia- supplement  ENDO- CBG OK- transition to q4 CBG + SSI  ID see above, presumed pneumonia- culture sputum and start empiric antibiotics  SCD + enoxaparin for DVT prophylaxis until INR therapeutic  DC mediastinal CT     LOS: 2 days  Loreli Slot 05/05/2017

## 2017-05-20 NOTE — Progress Notes (Signed)
Paged to come back to room for death of patient.  I will remain available however from previous conversation with the spouse of this patient, he really appreciates the support but doesn't really want Chaplain presence right now.  Presence is a reminder for him of bad news and having to face death of his precious wife which he is having a tough time with which I totally understand.  Chaplain is happy to return as needed or if family request support.  Please page and I will gladly return.  Thank you to the medical team who cared for this patient.   04/27/2017 1310  Clinical Encounter Type  Visited With Health care provider  Visit Type Follow-up;Death;Code

## 2017-05-20 NOTE — Progress Notes (Signed)
      [address]       [name] [address]             [phone]      Christy Campbell arrested earlier today after I had already started a case in the OR. I came out twice due to PEA, but I was in a critical part of the operation and could not leave during the terminal event.   She developed high cardiac output shock c/w sepsis. Had a rapid deterioration early this morning, becoming progressively more difficult to ventilate and requiring escalating doses of pressors. CXR showed a more definitive infiltrate in the right lower lobe, as well as a probable LUL infiltrate. Presumed pneumonia. Antibiotics were started. CCM was consulted and took over ventilator management. Developed acute renal failure and refractory metabolic acidosis. She also developed a respiratory acidosis. She had multiple PEA events. She was resuscitated relatively rapidly on the first 2 occassions, but could not be resuscitated from the final one.   [name] Dorris Fetch, MD [facility] [phone]

## 2017-05-20 NOTE — Progress Notes (Addendum)
Responded to Code Blue called for this patient who lost pulses.  Per granddaughter, she and her grandfather (patient's spouse) stepped outside for him to smoke and get some air.  Chaplain understands.     05/12/2017 1058  Clinical Encounter Type  Visited With Family;Patient not available  Visit Type Psychological support;Spiritual support;Social support;Code;Critical Care  Referral From Other (Comment) (Code Blue Called)  Consult/Referral To Chaplain  .  Family returned to many in the room working with patient.  Family expressed appreciation for Chaplain presence but said they were fine.  Chaplain will remain available should patient desire presence however "Chaplain" may be a little uncomfortable for spouse at this time as he moves away from me, which I understand.  He loves his precious wife and wants her to be okay.  Please page should support be needed or request be made.  Chaplain happy to provide care for family.  With spouse as updated by MD.  Followed family to waiting room to provide support and tissues.  Spouse thanked me again and said "I appreciate what you are trying to do but I would rather be left alone but thank you for trying.."  Chaplain totally gets this.    I will check back as patient is still in code status.

## 2017-05-20 NOTE — Progress Notes (Signed)
RT changed vent mode to PRVC, RR 18, PEEP 14, and 100 % FIO2 per MD Dr. Molli Knock.

## 2017-05-20 NOTE — Discharge Summary (Signed)
Physician Discharge Summary  Patient ID: Christy Campbell MRN: [id number] DOB/AGE: 1941/03/31 76 y.o.  Admit date: 2017/04/27 Discharge date: 05/08/2017  Admission Diagnoses:  3 vessel CAD, mitral regurgitation, tricuspid regurgitation Past Medical History:  Diagnosis Date  . Anemia    "long time ago"  . Arthritis    "back; right knee and ankle"  . Dyslipidemia   . Dyspnea    WITH ACTIVITY   . Dysrhythmia    ATRIAL FIB  . GERD (gastroesophageal reflux disease)   . H/O cardiovascular stress test 12/06/08   normal, no ischemia  . History of blood transfusion 1964   after childbirth  . History of kidney stones   . History of stomach ulcers 03/01/12   "little bitty ones"  . Hypertension    echo 12/06/08-EF>55%, mild AS and mild pulmonary htn  . Kidney stones   . Myocardial infarction (HCC) 01/2017  . Pneumonia 02/2017  . PVD (peripheral vascular disease) (HCC) 12/29/2011   h/o right common iliac artery stent, left SFA stent and left SFA directional atherectomy 02/29/12  . Tobacco abuse    Quit 01/30/2017    Discharge Diagnoses:  Active Problems:   S/P CABG x 4   Cardiac arrest (HCC)   Acute renal failure (HCC) Right lower lobe pneumonia Presumed ischemic bowel Sepsis Cerebrovascular accident Anemia secondary to ABL  Discharged Condition: Expired  Hospital Course:  Christy Campbell is a 76 yo woman with a medical history significant for tobacco abuse, hypertension, hyperlipidemia, morbid obesity, PAD, arthritis, St elevation MI, 3 vessel CAD, mitral regurgitation, tricuspid regurgitation, atrial fibrillation, GI bleed with no definite source. She had an ST elevation in July 2018 and had emergent stenting of her RCA. She was hospitalized later in the month with a GI bleed.   At her initial cath she had severe 3 vessel CAD. After PCi she had residual severe 2 vessel CAD. She also had moderate MR and TR on echo. She has had atrial fibrillation dating back to February of 2018.  She was offered CABG, Maze procedure, possible mitral and/or tricuspid repair. The indications, risks, benefits and alternatives were discussed preoperatively. She wished to proceed.  She was admitted on 10/1 and underwent CABG x 4, mitral valve repair, left atrial clipping and a Maze procedure. Her TR was noted to be moderate on intraoperative TEE. She required inotropic support with dopamine, milrinone and norepinephrine. The norepinephrine was weaned off on the first night postoperatively. She was noted to be agitated when attempting to wean the ventilator. She did move all extremities but did not follow commands and remained intubated. A head CT showed possible late acute or early subacute occipital lobe infarcts. She went into atrial fibrillation with a controlled rate. She was anemic and was transfused with PRBC.  On the morning of 10/3 she became progressively more difficult to ventilate with worsening oxygenation despite increased levels of ventilator support. She became hemodynamically unstable. She developed high cardiac output shock consistent with sepsis. Broad spectrum antibiotics were started. Critical care was consulted for ventilator management. Chest x ray suggested a right lower lobe pneumonia. She developed a profound metabolic acidosis, which was refractory to bicarbonate. She developed acute oliguric renal failure. She had multiple episodes of profound hypotension/ pulseless electrical activity arrests. Nephrology was consulted for possible initiation of CVVHD, but she had a third PEA arrest and deteriorated to asystole and could not be resuscitated. She expired.   Given the profound refractory metabolic acidosis and rapid deterioration, most probably had ischemic/  infarcted bowel.  Consults: pulmonary/intensive care and nephrology  Significant Diagnostic Studies:  TRANSESOPHAGEAL ECHOCARDIOGRAM Conclusions   Result status: Final result   Left ventricle: Cavity is small. LV  systolic function is mildly to moderately reduced with an EF of 40-45%. There are no obvious wall motion abnormalities. No thrombus present.  Septum: No Patent Foramen Ovale present.  Left atrium: Patent foramen ovale not present.  Aortic valve: The valve is trileaflet.  Mitral valve: Non-specific thickening. No leaflet thickening and calcification present. Moderate to severe regurgitation.  Tricuspid valve: Valve has a dilated annulus. Moderate regurgitation. The tricuspid valve regurgitation jet is central. Blunted (decreased) hepatic vein systolic flow.      CT HEAD WITHOUT CONTRAST  TECHNIQUE: Contiguous axial images were obtained from the base of the skull through the vertex without intravenous contrast.  COMPARISON:  None.  FINDINGS: Brain: There are bilateral areas of hypoattenuation in the occipital lobes, which are age-indeterminate. 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 No hemorrhage. No midline shift or mass effect. There is periventricular hypoattenuation compatible with chronic microvascular disease.  Vascular: No hyperdense vessel or unexpected calcification.  Skull: Normal visualized skull base, calvarium and extracranial soft tissues.  Sinuses/Orbits: No sinus fluid levels or advanced mucosal thickening. No mastoid effusion. Normal orbits.  IMPRESSION: 1. Bilateral areas of hypoattenuation in the occipital lobes are age indeterminate but may indicate late acute to early subacute infarcts. MRI is recommended. 2. No hemorrhage or mass effect. These results will be called to the ordering clinician or representative by the Radiologist Assistant, and communication documented in the PACS or zVision Dashboard. The   Electronically Signed   By: Deatra RobinsonKevin  Herman M.D.   On: 04/20/2017 19:21 PORTABLE CHEST 1 VIEW  COMPARISON:  04/27/2017  FINDINGS: Endotracheal tube 4.2 cm above the carina. Nasogastric tube enters the stomach. Mediastinal drain  and left-sided chest tube remain in place. Right Swan-Ganz catheter is in the right pulmonary artery.  Prior CABG.  Prosthetic mitral valve.  Epicardial pacer leads noted.  Mildly worsened right and stable left opacity at the lung bases with obscuration of the left hemidiaphragm as before, probably from pleural effusion and/ or atelectasis. Rounded density in the left mid lung probably reflecting loculated fluid in the major fissure.  No visible pneumothorax.  IMPRESSION: 1. Mildly worsened aeration in the right mid lung and right lung base, although some of this could be from differing distribution of pleural effusion. There is likely a component of atelectasis in the lung bases, pneumonia not completely excluded. 2. Loculated fluid in the left major fissure. 3. Support apparatus satisfactorily position.  No pneumothorax.   Electronically Signed   By: Gaylyn RongWalter  Liebkemann M.D.   On: 05/02/2017 09:46  Treatments: antibiotics: vancomycin and Zosyn, cardiac meds: dopamine, milrinone, norepinephrine, insulin: regular, respiratory therapy: albuterol/atropine nebulizer and ventilator and surgery: CABG, mitral repair, maze.  Discharge Exam: Blood pressure (!) 76/47, pulse 88, temperature 98.4 F (36.9 C), resp. rate (!) 35, weight 197 lb 1.5 oz (89.4 kg), SpO2 (!) 82 %. Expired  Disposition: 20-Expired   Allergies as of 05/09/2017      Reactions   Aspirin Nausea And Vomiting, Other (See Comments)   "pain; hurting"   Phenergan [promethazine Hcl] Other (See Comments)   Severe confusion DO NOT GIVE IN ANY FORM    Tetanus Toxoids Anaphylaxis, Swelling   Phenergan [promethazine] Other (See Comments)   Severe confusion   Hydrocodone Nausea Only   Oxycodone Nausea Only   Pravastatin    myalgia  Medication List    ASK your doctor about these medications   acetaminophen 500 MG tablet Commonly known as:  TYLENOL Take 500 mg by mouth every 6 (six) hours as needed.    atorvastatin 80 MG tablet Commonly known as:  LIPITOR Take 1 tablet (80 mg total) by mouth daily at 6 PM.   clopidogrel 75 MG tablet Commonly known as:  PLAVIX Take 1 tablet (75 mg total) by mouth daily.   diltiazem 240 MG 24 hr capsule Commonly known as:  CARDIZEM CD Take 1 capsule (240 mg total) by mouth daily.   FISH OIL PEARLS 300 MG Caps Take 300 mg by mouth daily.   furosemide 20 MG tablet Commonly known as:  LASIX Take 1 tablet (20 mg total) by mouth daily. Note dose change.   metoprolol tartrate 100 MG tablet Commonly known as:  LOPRESSOR Take 1 tablet (100 mg total) by mouth 2 (two) times daily.   multivitamin with minerals Tabs tablet Take 1 tablet by mouth daily.   nitroGLYCERIN 0.4 MG SL tablet Commonly known as:  NITROSTAT Place 1 tablet (0.4 mg total) under the tongue every 5 (five) minutes x 3 doses as needed for chest pain.   pantoprazole 40 MG tablet Commonly known as:  PROTONIX Take 1 tablet (40 mg total) by mouth 2 (two) times daily.   rivaroxaban 20 MG Tabs tablet Commonly known as:  XARELTO Take 1 tablet (20 mg total) by mouth daily with supper. Note dose change.   vitamin C 500 MG tablet Commonly known as:  ASCORBIC ACID Take 1,000 mg by mouth daily.        Signed: Loreli Slot 05/18/2017, 9:02 AM

## 2017-05-20 NOTE — Procedures (Signed)
CPR  PEA cardiac arrest with extreme acidosis and a mix of cardiogenic and septic shock.  No shocks delivered.  Epi, atropine and bicarb with CPR.  Please see code sheet for details.  Alyson Reedy, M.D. [facility] Pulmonary/Critical Care Medicine. Pager: [phone]. After hours pager: [phone].

## 2017-05-20 NOTE — Progress Notes (Addendum)
Pharmacy Antibiotic Note  Christy Campbell is a 76 y.o. female POD #2 CABG x4 and MV repair now with persistent fever and increased oxygen requirement overnight. She will be started on vancomycin and ceftazidime for HCAP. SCr with slight trend up to 1.33, CrCl 41 ml/min.  Vancomycin trough goal 15-20  Plan: 1) Vancomycin 1500mg  IV x 1 then 1250mg  IV q24 2) Ceftazidime 1g IV q12 per MD 3) Follow renal function, cultures, LOT, level if needed   ADDENDUM: Patient now starting CRRT. Already received vancomycin loading dose. Change vancomycin to 1000mg  IV q24 (~10mg /kg) and ceftazidime to 2g IV q12.  Weight: 197 lb 1.5 oz (89.4 kg)  Temp (24hrs), Avg:100.1 F (37.8 C), Min:99 F (37.2 C), Max:100.9 F (38.3 C)   Recent Labs Lab 04/26/2017 1451 04/20/2017 1909  05/18/2017 2208 04/20/17 0343 04/20/17 1556 04/20/17 1604 2017-05-11 0304  WBC 32.3* 23.5*  --   --  17.3* 22.5*  --  11.5*  CREATININE  --  0.98  < > 1.00 1.18* 1.19* 1.10* 1.33*  < > = values in this interval not displayed.  Estimated Creatinine Clearance: 41.1 mL/min (A) (by C-G formula based on SCr of 1.33 mg/dL (H)).    Allergies  Allergen Reactions  . Aspirin Nausea And Vomiting and Other (See Comments)    "pain; hurting"  . Phenergan [Promethazine Hcl] Other (See Comments)    Severe confusion  DO NOT GIVE IN ANY FORM   . Tetanus Toxoids Anaphylaxis and Swelling  . Phenergan [Promethazine] Other (See Comments)    Severe confusion  . Hydrocodone Nausea Only  . Oxycodone Nausea Only  . Pravastatin     myalgia    Antimicrobials this admission: 10/1Cefuroxime >> 10/2 10/1 Vancomycin x 2 doses post-op 10/3 Vancomycin >> 10/3 Ceftazidime >>  Dose adjustments this admission: n/a  Microbiology results: 10/3 resp cx>>  Thank you for allowing pharmacy to be a part of this patient's care.  Fredrik Rigger 11-May-2017 7:44 AM

## 2017-05-20 NOTE — Progress Notes (Signed)
  Echocardiogram 2D Echocardiogram has been performed.  Leta Jungling M 05-20-17, 10:33 AM

## 2017-05-20 NOTE — Care Management Note (Signed)
Case Management Note  Patient Details  Name: Christy Campbell MRN: [id number] Date of Birth: 1941-07-11  Subjective/Objective:     Pt s/p CABG, MAZE, MVR and clipping - remains intubated with deteriorating neuro status   Action/Plan:   Pt is intubated.  No family at bedside   Expected Discharge Date:  04/20/17               Expected Discharge Plan:     In-House Referral:     Discharge planning Services  CM Consult  Post Acute Care Choice:    Choice offered to:     DME Arranged:    DME Agency:     HH Arranged:    HH Agency:     Status of Service:     If discussed at [employer], dates discussed:    Additional Comments:  Cherylann Parr, RN 05/13/17, 10:22 AM

## 2017-05-20 NOTE — Progress Notes (Signed)
Transported pt to and from 2H26 to CT2 on ventilator. Pt stable throughout with no complications.

## 2017-05-20 NOTE — Progress Notes (Signed)
Routine vent check not done, CPR in progress at that time

## 2017-05-20 NOTE — Progress Notes (Signed)
RT called, SAT very low. Taken off vent and bagged for 15 min with PEEP valve 15. Placed patient back on vent, recruitment done for 2 min. SAT now 93%. ABG obtained. MD at bedside

## 2017-05-20 NOTE — Progress Notes (Signed)
Called for multiple episodes of instability and cardiac arrests.  Spoke with family, full code.  Discussed with CVTS.  Will proceed with CRRT if nephrology agrees.  The patient is critically ill with multiple organ systems failure and requires high complexity decision making for assessment and support, frequent evaluation and titration of therapies, application of advanced monitoring technologies and extensive interpretation of multiple databases.   Critical Care Time devoted to patient care services described in this note is  60  Minutes. This time reflects time of care of this signee Dr Koren Bound. This critical care time does not reflect procedure time, or teaching time or supervisory time of PA/NP/Med student/Med Resident etc but could involve care discussion time.  Alyson Reedy, M.D. [facility] Pulmonary/Critical Care Medicine. Pager: [phone]. After hours pager: [phone].

## 2017-05-20 NOTE — Consult Note (Signed)
[facility] Consultation Note Requesting Physician:  Nelda Marseille Reason for Consult:  AKI, profound mixed metabolic and respiratory acidosis  HPI: The patient is a 76 y.o. year-old WF HTN, HLD, GERD, obesity, GIB. S/p 4V CABG, MV repair, L/R MAZE procedure 05/09/2017. Post op AF, probable stroke, poss PNA, never extubated (hypoxemia). Today worsening infiltrates, PEA then asystolic arrest requiring CPR, ? pulm hemorrhage, shock mixed septic/cardiogenic and severe mixed resp and metabolic acidosis with pH 7 after 5 amps bicarb, bicarb drip. Unable to ventilate well - last pCO2 60's. Asked to see to provide CRRT for control acidosis. Pt presently anuric. Hypotensive on multiple pressors + milrinone. CCM placing line.  Date/Time Value  05-13-17 03:04 AM 1.33 (H)  04/20/2017 04:04 PM 1.10 (H)  04/20/2017 03:56 PM 1.19 (H)  04/20/2017 03:43 AM 1.18 (H)  04/29/2017 10:08 PM 1.00  04/30/2017 07:19 PM 0.90  04/26/2017 07:09 PM 0.98  05/17/2017 01:35 PM 0.80  05/06/2017 12:24 PM 0.70  04/23/2017 12:00 PM 0.70  04/22/2017 10:46 AM 0.80  04/28/2017 09:53 AM 0.80  04/28/2017 09:31 AM 0.90  04/27/2017 08:17 AM 0.90    Past Medical History:  Diagnosis Date  . Anemia    "long time ago"  . Arthritis    "back; right knee and ankle"  . Dyslipidemia   . Dyspnea    WITH ACTIVITY   . Dysrhythmia    ATRIAL FIB  . GERD (gastroesophageal reflux disease)   . H/O cardiovascular stress test 12/06/08   normal, no ischemia  . History of blood transfusion 1964   after childbirth  . History of kidney stones   . History of stomach ulcers 03/01/12   "little bitty ones"  . Hypertension    echo 12/06/08-EF>55%, mild AS and mild pulmonary htn  . Kidney stones   . Myocardial infarction ([place]) 01/2017  . Pneumonia 02/2017  . PVD (peripheral vascular disease) ([place]) 12/29/2011   h/o right common iliac artery stent, left SFA stent and left SFA directional atherectomy 02/29/12  . Tobacco abuse    Quit 01/30/2017    Past  Surgical History:  Procedure Laterality Date  . APPENDECTOMY    . ATHERECTOMY  03/01/2012  . ATHERECTOMY N/A 12/29/2011   Procedure: ATHERECTOMY;  Surgeon: Lorretta Harp, MD;  Location: [facility] CATH LAB;  Service: Cardiovascular;  Laterality: N/A;  . ATHERECTOMY N/A 03/01/2012   Procedure: ATHERECTOMY;  Surgeon: Lorretta Harp, MD;  Location: [facility] CATH LAB;  Service: Cardiovascular;  Laterality: N/A;  . CARDIOVERSION N/A 08/10/2016   Procedure: CARDIOVERSION;  Surgeon: Sanda Klein, MD;  Location: [facility] ENDOSCOPY;  Service: Cardiovascular;  Laterality: N/A;  . CHOLECYSTECTOMY    . CLIPPING OF ATRIAL APPENDAGE  05/19/2017   Procedure: CLIPPING OF ATRIAL APPENDAGE;  Surgeon: Melrose Nakayama, MD;  Location: [place];  Service: Open Heart Surgery;;  . COLONOSCOPY    . CORONARY ARTERY BYPASS GRAFT N/A 05/08/2017   Procedure: CORONARY ARTERY BYPASS GRAFTING (CABG) x four , using left internal mammary artery and right leg greater saphenous vein harvested endoscopically;  Surgeon: Melrose Nakayama, MD;  Location: [place];  Service: Open Heart Surgery;  Laterality: N/A;  . CORONARY/GRAFT ACUTE MI REVASCULARIZATION N/A 01/30/2017   Procedure: Coronary/Graft Acute MI Revascularization;  Surgeon: Martinique, Peter M, MD;  Location: [place] CV LAB;  Service: Cardiovascular;  Laterality: N/A;  . ESOPHAGOGASTRODUODENOSCOPY (EGD) WITH PROPOFOL N/A 02/15/2017   Procedure: ESOPHAGOGASTRODUODENOSCOPY (EGD) WITH PROPOFOL;  Surgeon: Ronald Lobo, MD;  Location: [place];  Service: Endoscopy;  Laterality:  N/A;  Recent MI  . EYE SURGERY Bilateral    Vitrectomy, and bilateral cataracts  . INGUINAL HERNIA REPAIR  1990's   right  . KNEE ARTHROSCOPY  1990's   bilaterally  . LASER PHOTO ABLATION Right 07/27/2013   Procedure: LASER PHOTO ABLATION;  Surgeon: Hurman Horn, MD;  Location: [place];  Service: Ophthalmology;  Laterality: Right;  . LEFT HEART CATH AND CORONARY ANGIOGRAPHY N/A 01/30/2017   Procedure: Left  Heart Cath and Coronary Angiography;  Surgeon: Martinique, Peter M, MD;  Location: [place] CV LAB;  Service: Cardiovascular;  Laterality: N/A;  . MAZE N/A 05/11/2017   Procedure: MAZE;  Surgeon: Melrose Nakayama, MD;  Location: [place];  Service: Open Heart Surgery;  Laterality: N/A;  . MITRAL VALVE REPAIR N/A 05/15/2017   Procedure: MITRAL VALVE REPAIR (MVR);  Surgeon: Melrose Nakayama, MD;  Location: [place];  Service: Open Heart Surgery;  Laterality: N/A;  . PARS PLANA VITRECTOMY Right 07/27/2013   Procedure: PARS PLANA VITRECTOMY WITH 25 GAUGE;  Surgeon: Hurman Horn, MD;  Location: [place];  Service: Ophthalmology;  Laterality: Right;  . PERIPHERAL ARTERIAL STENT GRAFT  03/01/2012   successful directional atheretomy using turbo hawk of a focal prox left SFA lesion   . PV angiogram  12/29/11   successful directinal atherectomy using the turbo hawk to righ SFA stenosis  . PV angiogram  04/17/10   successful PTA/stenting of the left superficial femoral artery and right iliac  . TEE WITHOUT CARDIOVERSION N/A 05/14/2017   Procedure: TRANSESOPHAGEAL ECHOCARDIOGRAM (TEE);  Surgeon: Melrose Nakayama, MD;  Location: [place];  Service: Open Heart Surgery;  Laterality: N/A;  . TUBAL LIGATION  1960's  . VAGINAL HYSTERECTOMY  1970's    Family History  Problem Relation Age of Onset  . CVA Mother   . Cancer Mother   . Pneumonia Father    Social History:  reports that she quit smoking about 2 months ago. Her smoking use included Cigarettes. She has a 12.50 pack-year smoking history. She has never used smokeless tobacco. She reports that she does not drink alcohol or use drugs.   Allergies  Allergen Reactions  . Aspirin Nausea And Vomiting and Other (See Comments)    "pain; hurting"  . Phenergan [Promethazine Hcl] Other (See Comments)    Severe confusion  DO NOT GIVE IN ANY FORM   . Tetanus Toxoids Anaphylaxis and Swelling  . Phenergan [Promethazine] Other (See Comments)    Severe confusion   . Hydrocodone Nausea Only  . Oxycodone Nausea Only  . Pravastatin     myalgia    Prior to Admission medications   Medication Sig Start Date End Date Taking? Authorizing Provider  acetaminophen (TYLENOL) 500 MG tablet Take 500 mg by mouth every 6 (six) hours as needed.   Yes [provider]  atorvastatin (LIPITOR) 80 MG tablet Take 1 tablet (80 mg total) by mouth daily at 6 PM. 02/05/17  Yes Lorretta Harp, MD  diltiazem (CARDIZEM CD) 240 MG 24 hr capsule Take 1 capsule (240 mg total) by mouth daily. 03/09/17  Yes Almyra Deforest, PA  furosemide (LASIX) 20 MG tablet Take 1 tablet (20 mg total) by mouth daily. Note dose change. 03/30/17  Yes Lorretta Harp, MD  metoprolol tartrate (LOPRESSOR) 100 MG tablet Take 1 tablet (100 mg total) by mouth 2 (two) times daily. 02/04/17  Yes Bhagat, Bhavinkumar, PA  Multiple Vitamin (MULITIVITAMIN WITH MINERALS) TABS Take 1 tablet by mouth daily.  Yes [provider]  nitroGLYCERIN (NITROSTAT) 0.4 MG SL tablet Place 1 tablet (0.4 mg total) under the tongue every 5 (five) minutes x 3 doses as needed for chest pain. 02/05/17  Yes Lorretta Harp, MD  Omega-3 Fatty Acids (FISH OIL PEARLS) 300 MG CAPS Take 300 mg by mouth daily.   Yes [provider]  pantoprazole (PROTONIX) 40 MG tablet Take 1 tablet (40 mg total) by mouth 2 (two) times daily. 02/18/17  Yes Velvet Bathe, MD  vitamin C (ASCORBIC ACID) 500 MG tablet Take 1,000 mg by mouth daily.    Yes [provider]  clopidogrel (PLAVIX) 75 MG tablet Take 1 tablet (75 mg total) by mouth daily. 02/05/17   Lorretta Harp, MD  rivaroxaban (XARELTO) 20 MG TABS tablet Take 1 tablet (20 mg total) by mouth daily with supper. Note dose change. 04/13/17   Lorretta Harp, MD    Inpatient medications: . acetaminophen  1,000 mg Oral Q6H   Or  . acetaminophen (TYLENOL) oral liquid 160 mg/5 mL  1,000 mg Per Tube Q6H  . atorvastatin  80 mg Oral q1800  . bisacodyl  10 mg Oral Daily    Or  . bisacodyl  10 mg Rectal Daily  . chlorhexidine gluconate (MEDLINE KIT)  15 mL Mouth Rinse BID  . Chlorhexidine Gluconate Cloth  6 each Topical Daily  . cisatracurium  0.1 mg/kg Intravenous Once  . clopidogrel  75 mg Oral Daily  . docusate sodium  200 mg Oral Daily  . enoxaparin (LOVENOX) injection  40 mg Subcutaneous Q24H  . EPINEPHrine      . hydrocortisone sodium succinate  50 mg Intravenous Q6H  . insulin aspart  0-15 Units Subcutaneous Q4H  . insulin detemir  15 Units Subcutaneous Daily  . insulin regular  0-10 Units Intravenous TID WC  . levalbuterol  0.63 mg Nebulization Q6H  . mouth rinse  15 mL Mouth Rinse 10 times per day  . metoprolol tartrate  12.5 mg Oral BID   Or  . metoprolol tartrate  12.5 mg Per Tube BID  . pantoprazole  40 mg Oral Daily  . potassium chloride  40 mEq Per Tube TID  . sodium bicarbonate      . sodium bicarbonate      . sodium bicarbonate      . sodium bicarbonate      . sodium chloride flush  10-40 mL Intracatheter Q12H  . sodium chloride flush  3 mL Intravenous Q12H  . vecuronium  10 mg Intravenous Once  . warfarin  2.5 mg Oral q1800  . Warfarin - Physician Dosing Inpatient   Does not apply q1800  Infusions . sodium chloride 20 mL/hr at 2017/05/07 0800  . sodium chloride    . sodium chloride Stopped (04/20/17 1000)  . sodium chloride    . sodium chloride    . sodium chloride    . amiodarone 30 mg/hr (May 07, 2017 0700)  . cefTAZidime (FORTAZ)  IV    . dexmedetomidine (PRECEDEX) IV infusion 0.7 mcg/kg/hr (May 07, 2017 0800)  . DOPamine 5 mcg/kg/min (May 07, 2017 0950)  . furosemide (LASIX) infusion 10 mg/hr (May 07, 2017 0800)  . lactated ringers Stopped (05/04/2017 1626)  . lactated ringers Stopped (04/27/2017 2230)  . lactated ringers 20 mL/hr at May 07, 2017 0800  . milrinone 0.125 mcg/kg/min (05-07-2017 1006)  . nitroGLYCERIN Stopped (2017-05-07 0700)  . norepinephrine (LEVOPHED) Adult infusion 40 mcg/min (05/07/17 1013)  . phenylephrine (NEO-SYNEPHRINE)  Adult infusion Stopped (05/07/2017 2200)  .  sodium  bicarbonate  infusion 1000 mL 150 mL/hr at May 05, 2017 1056  . [START ON 04/22/2017] vancomycin    . vasopressin (PITRESSIN) infusion - *FOR SHOCK* 0.03 Units/min (May 05, 2017 0959)    Review of Systems Unobtainable  Physical Exam:  Blood pressure (!) 227/214, pulse (!) 106, temperature (!) 101.5 F (38.6 C), resp. rate (!) 27, weight 89.4 kg (197 lb 1.5 oz), SpO2 (!) 88 %.  Gen: Intubated, pale ETT bloody secretions NG Left chest tubes/mediastinal drain/temp pacer S1S2 No S3 Abdomen quiet but not distended 1+ edema LE's which are cool Foley no urine   Recent Labs Lab 04/16/17 1101  05/04/2017 1335 05/04/2017 1446 05/01/2017 1909 05/05/2017 1919 04/22/2017 2208 04/20/17 0343 04/20/17 1556 04/20/17 1604 2017/05/05 0304  NA 138  < > 139 142  --  141 141 140 140 142 140  K 3.7  < > 4.4 4.0  --  3.9 3.3* 3.2* 4.2 4.1 3.4*  CL 109  < > 102  --   --  106 105 110 110 107 109  CO2 17*  --   --   --   --   --   --  19* 20*  --  19*  GLUCOSE 114*  < > 174* 147*  --  122* 156* 104* 118* 121* 104*  BUN 25*  < > 24*  --   --  21* 21* 22* 24* 23* 27*  CREATININE 1.03*  < > 0.80  --  0.98 0.90 1.00 1.18* 1.19* 1.10* 1.33*  CALCIUM 9.3  --   --   --   --   --   --  8.5* 8.6*  --  8.4*  < > = values in this interval not displayed.   Recent Labs Lab 04/16/17 1101  AST 28  ALT 28  ALKPHOS 129*  BILITOT 1.4*  PROT 6.7  ALBUMIN 3.7    Recent Labs Lab 05/10/2017 1909  04/20/17 0343 04/20/17 1556 04/20/17 1604 2017-05-05 0304  WBC 23.5*  --  17.3* 22.5*  --  11.5*  HGB 7.8*  < > 8.3* 7.5* 7.1* 8.9*  HCT 24.1*  < > 24.6* 22.2* 21.0* 25.9*  MCV 81.1  --  80.7 79.9  --  79.7  PLT 130*  --  114* 124*  --  98*  < > = values in this interval not displayed.   Recent Labs Lab 2017/05/05 0258 05-May-2017 0503 2017-05-05 0606 May 05, 2017 0644 2017-05-05 0757  GLUCAP 105* 96 97 118* 96   ABG    Component Value Date/Time   PHART 7.035 (LL) 05-05-17  1119   PCO2ART 69.7 (HH) 05/05/2017 1119   PO2ART 84.0 05-May-2017 1119   HCO3 18.5 (L) 05-05-17 1119   TCO2 21 (L) 05/05/2017 1119   ACIDBASEDEF 11.0 (H) 05/05/17 1119   O2SAT 88.0 May 05, 2017 1119    Xrays/Other Studies: Ct Head Wo Contrast  Result Date: 04/20/2017 CLINICAL DATA:  Altered mental status EXAM: CT HEAD WITHOUT CONTRAST TECHNIQUE: Contiguous axial images were obtained from the base of the skull through the vertex without intravenous contrast. COMPARISON:  None. FINDINGS: Brain: There are bilateral areas of hypoattenuation in the occipital lobes, which are age-indeterminate. 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 No hemorrhage. No midline shift or mass effect. There is periventricular hypoattenuation compatible with chronic microvascular disease. Vascular: No hyperdense vessel or unexpected calcification. Skull: Normal visualized skull base, calvarium and extracranial soft tissues. Sinuses/Orbits: No sinus fluid levels or advanced mucosal thickening. No mastoid effusion. Normal orbits. IMPRESSION: 1. Bilateral areas  of hypoattenuation in the occipital lobes are age indeterminate but may indicate late acute to early subacute infarcts. MRI is recommended. 2. No hemorrhage or mass effect. These results will be called to the ordering clinician or representative by the Radiologist Assistant, and communication documented in the PACS or zVision Dashboard. The Electronically Signed   By: Ulyses Jarred M.D.   On: 04/20/2017 19:21   Dg Chest Port 1 View  Result Date: 2017/05/14 CLINICAL DATA:  CS EXAM: PORTABLE CHEST 1 VIEW COMPARISON:  05/14/2017 FINDINGS: Endotracheal tube 4.2 cm above the carina. Nasogastric tube enters the stomach. Mediastinal drain and left-sided chest tube remain in place. Right Swan-Ganz catheter is in the right pulmonary artery. Prior CABG.  Prosthetic mitral valve.  Epicardial pacer leads noted. Mildly worsened right and stable left opacity at the lung bases with  obscuration of the left hemidiaphragm as before, probably from pleural effusion and/ or atelectasis. Rounded density in the left mid lung probably reflecting loculated fluid in the major fissure. No visible pneumothorax. IMPRESSION: 1. Mildly worsened aeration in the right mid lung and right lung base, although some of this could be from differing distribution of pleural effusion. There is likely a component of atelectasis in the lung bases, pneumonia not completely excluded. 2. Loculated fluid in the left major fissure. 3. Support apparatus satisfactorily position.  No pneumothorax. Electronically Signed   By: Van Clines M.D.   On: 05/14/17 09:46   Dg Chest Port 1 View  Result Date: 14-May-2017 CLINICAL DATA:  Low saturations this morning. Endotracheal and chest tubes. EXAM: PORTABLE CHEST 1 VIEW COMPARISON:  04/20/2017 FINDINGS: Postoperative changes in the mediastinum. Endotracheal tube with tip measuring 2.9 cm above the carina. Enteric tube tip is off the field of view but below the left hemidiaphragm. Left chest tube and mediastinal drains remain in place. Right Swan-Ganz catheter with tip in the right pulmonary artery. Mild cardiac enlargement. Mild perihilar infiltration may represent edema or pneumonia. Small bilateral pleural effusions greater on the left. No pneumothorax. IMPRESSION: Appliances appear in satisfactory position. Cardiac enlargement with mild perihilar edema. Can't exclude basilar pneumonia. Small bilateral pleural effusions. No significant changes since previous study. Electronically Signed   By: Lucienne Capers M.D.   On: 2017-05-14 05:44   Dg Chest Port 1 View  Result Date: 04/20/2017 CLINICAL DATA:  Hypoxia EXAM: PORTABLE CHEST 1 VIEW COMPARISON:  April 19, 2017 FINDINGS: Endotracheal tube tip is 3.9 cm above the carina. Swan-Ganz catheter tip is in the right main pulmonary artery. Nasogastric tube tip and side port are below the diaphragm. There is a left chest tube  and a mediastinal drain. Temporary pacemaker wires are attached to the right heart. No pneumothorax. There are pleural effusions bilaterally with mild interstitial edema. There is consolidation in the left lower lobe. Heart is mildly enlarged with mild pulmonary venous hypertension. Patient is status post valve replacement. There is a left atrial appendage clamp. IMPRESSION: Tube and catheter positions as described without evident pneumothorax. There is underlying interstitial edema with pleural effusions. Left lower lobe consolidation may represent alveolar edema or superimposed pneumonia. Both entities may exist concurrently. Electronically Signed   By: Lowella Grip III M.D.   On: 04/20/2017 07:55   Dg Chest Port 1 View  Result Date: 05/19/2017 CLINICAL DATA:  76 year old female post CABG and mitral valve replacement. Subsequent encounter. EXAM: PORTABLE CHEST 1 VIEW COMPARISON:  04/16/2017 chest x-ray. FINDINGS: Endotracheal tube tip 3.8 cm above the carina. Right-sided Swan-Ganz catheter tip right  main pulmonary artery. 2 left-sided chest tubes and mediastinal drain in place. Post CABG and mitral valve replacement.  Cardiomegaly. Right lung apex not entirely included on present exam. No gross pneumothorax. Mild pulmonary vascular prominence superimposed upon chronic changes. Bibasilar atelectasis. Calcified slightly tortuous aorta. IMPRESSION: Post CABG and mitral valve replacement.  Cardiomegaly. Swan-Ganz catheter tip right main pulmonary artery level. Endotracheal tube tip 3.8 cm above the carina. No gross pneumothorax with left-sided chest tubes and mediastinal drain in place. Pulmonary vascular prominence superimposed upon chronic lung changes. Bibasilar subsegmental atelectasis. Electronically Signed   By: Genia Del M.D.   On: 05/11/2017 15:33    Background: 76 y.o. year-old WF HTN, HLD, GERD, obesity, GIB. S/p 4V CABG, MV repair, L/R MAZE procedure 05/03/2017. Post op AF, probable stroke,  poss PNA, never extubated (hypoxemia). 10/3 worsening infiltrates, PEA then asystolic arrest requiring CPR, ? pulm hemorrhage, shock mixed septic/cardiogenic and severe mixed resp and metabolic acidosis with pH 7 after 5 amps bicarb, bicarb drip. Unable to ventilate well - last pCO2 60's. Asked to see to provide CRRT for control acidosis. Pt presently anuric. Hypotensive on multiple pressors + milrinone. CCM placing line.  Assessment/Recommendations  1. Mixed metabolic and resp acidosis - difficult to ventilate and pH 7 despite 5 amps bicarb and drip. No urine. Only thing I have to offer here is CRRT to facilitate aggressive bicarbonate administration and even with this she may not survive 1. If full measure of support still desired, then CRRT only thing I have to offer here 2. Keep even 3. Peripheral bicarbonate administration - rate at discretion of CCM 4. No heparin 5. CCM to place line 2. S/p 4V CABG, MAZE procedure - per Dr. Roxan Hockey 3. Resp failure/ARDS  4. PNA/HCAP vs pulm hemorrhage - per CCM 5. AKI - anuric at present. Hemodynamically driven.   6. Anemia 7. Possible stroke post op  Pt critically ill and even with CRRT (if she tolerates) may well not survive.  Jamal Maes,  MD [facility] [phone] pager Apr 22, 2017, 11:27 AM

## 2017-05-20 NOTE — Procedures (Signed)
CPR  Another episode of asystole arrest this time.  See code sheet for details.  ROSC established.  Alyson Reedy, M.D. [facility]. Pager: [phone]. After hours pager: [phone].

## 2017-05-20 NOTE — Progress Notes (Signed)
RT performed recruitment maneuver through vent per MD. Patient tolerated for 1 minute and then RT flipped back to full support due to BP dropping. Vitals are currently stable and sats are now 98%. RT will continue to monitor.

## 2017-05-20 NOTE — Consult Note (Signed)
PULMONARY / CRITICAL CARE MEDICINE   Name: Christy Campbell MRN: [id number] DOB: 1941-01-20    ADMISSION DATE:  05/14/2017 CONSULTATION DATE:  05/06/2017  REFERRING MD:  Dorris Fetch  CHIEF COMPLAINT:  ARDS and HCAP  HISTORY OF PRESENT ILLNESS:   76 year old female with PMH of CAD and PVD presenting for a CABG who was kept intubated due to hypoxemia.  PCCM was called after patient remained persistently hypoxemic.  Upon evaluating patient, patient had RLL and LUL infiltrate noted.  Patient was in ARDS.  Minimal secretions noted.  No further history available.    PAST MEDICAL HISTORY :  She  has a past medical history of Anemia; Arthritis; Dyslipidemia; Dyspnea; Dysrhythmia; GERD (gastroesophageal reflux disease); H/O cardiovascular stress test (12/06/08); History of blood transfusion (1964); History of kidney stones; History of stomach ulcers (03/01/12); Hypertension; Kidney stones; Myocardial infarction [facility]) (01/2017); Pneumonia (02/2017); PVD (peripheral vascular disease) (HCC) (12/29/2011); and Tobacco abuse.  PAST SURGICAL HISTORY: She  has a past surgical history that includes Peripheral arterial stent graft (03/01/2012); Atherectomy (03/01/2012); Inguinal hernia repair (1990's); Knee arthroscopy (6815'T); Vaginal hysterectomy (1970's); Tubal ligation (1960's); PV angiogram (12/29/11); PV angiogram (04/17/10); Colonoscopy; Eye surgery (Bilateral); Appendectomy; Cholecystectomy; Pars plana vitrectomy (Right, 07/27/2013); Laser photo ablation (Right, 07/27/2013); atherectomy (N/A, 12/29/2011); atherectomy (N/A, 03/01/2012); Cardioversion (N/A, 08/10/2016); LEFT HEART CATH AND CORONARY ANGIOGRAPHY (N/A, 01/30/2017); Coronary/Graft Acute MI Revascularization (N/A, 01/30/2017); Esophagogastroduodenoscopy (egd) with propofol (N/A, 02/15/2017); Coronary artery bypass graft (N/A, 05-14-17); MAZE (N/A, 2017-05-14); Mitral valve repair (N/A, 05/14/17); TEE without cardioversion (N/A, 05-14-2017); and Clipping of atrial  appendage (05/14/17).  Allergies  Allergen Reactions  . Aspirin Nausea And Vomiting and Other (See Comments)    "pain; hurting"  . Phenergan [Promethazine Hcl] Other (See Comments)    Severe confusion  DO NOT GIVE IN ANY FORM   . Tetanus Toxoids Anaphylaxis and Swelling  . Phenergan [Promethazine] Other (See Comments)    Severe confusion  . Hydrocodone Nausea Only  . Oxycodone Nausea Only  . Pravastatin     myalgia    No current facility-administered medications on file prior to encounter.    Current Outpatient Prescriptions on File Prior to Encounter  Medication Sig  . atorvastatin (LIPITOR) 80 MG tablet Take 1 tablet (80 mg total) by mouth daily at 6 PM.  . diltiazem (CARDIZEM CD) 240 MG 24 hr capsule Take 1 capsule (240 mg total) by mouth daily.  . furosemide (LASIX) 20 MG tablet Take 1 tablet (20 mg total) by mouth daily. Note dose change.  . metoprolol tartrate (LOPRESSOR) 100 MG tablet Take 1 tablet (100 mg total) by mouth 2 (two) times daily.  . Multiple Vitamin (MULITIVITAMIN WITH MINERALS) TABS Take 1 tablet by mouth daily.  . nitroGLYCERIN (NITROSTAT) 0.4 MG SL tablet Place 1 tablet (0.4 mg total) under the tongue every 5 (five) minutes x 3 doses as needed for chest pain.  . Omega-3 Fatty Acids (FISH OIL PEARLS) 300 MG CAPS Take 300 mg by mouth daily.  . pantoprazole (PROTONIX) 40 MG tablet Take 1 tablet (40 mg total) by mouth 2 (two) times daily.  . vitamin C (ASCORBIC ACID) 500 MG tablet Take 1,000 mg by mouth daily.   . clopidogrel (PLAVIX) 75 MG tablet Take 1 tablet (75 mg total) by mouth daily.    FAMILY HISTORY:  Her indicated that her mother is deceased. She indicated that her father is deceased.    SOCIAL HISTORY: She  reports that she quit smoking about 2 months ago.  Her smoking use included Cigarettes. She has a 12.50 pack-year smoking history. She has never used smokeless tobacco. She reports that she does not drink alcohol or use drugs.  REVIEW OF  SYSTEMS:   Unattainable, sedated and paralyzed.  SUBJECTIVE:  Severe hypoxemia overnight.  VITAL SIGNS: BP (!) 227/214   Pulse (!) 106   Temp (!) 101.5 F (38.6 C)   Resp (!) 27   Wt 89.4 kg (197 lb 1.5 oz)   SpO2 (!) 88%   BMI 31.81 kg/m   HEMODYNAMICS: PAP: (21-45)/(5-20) 41/14 CO:  [3.9 L/min-4.9 L/min] 3.9 L/min CI:  [2.1 L/min/m2-2.5 L/min/m2] 2.1 L/min/m2  VENTILATOR SETTINGS: Vent Mode: PRVC FiO2 (%):  [50 %-100 %] 100 % Set Rate:  [14 bmp-30 bmp] 30 bmp Vt Set:  [420 mL-580 mL] 420 mL PEEP:  [5 cmH20-14 cmH20] 14 cmH20 Pressure Support:  [10 cmH20] 10 cmH20 Plateau Pressure:  [14 cmH20-29 cmH20] 25 cmH20  INTAKE / OUTPUT: I/O last 3 completed shifts: In: 5243.3 [I.V.:3103.3; Blood:710; NG/GT:130; IV Piggyback:1300] Out: 5195 [Urine:3220; Emesis/NG output:745; Chest Tube:1230]  PHYSICAL EXAMINATION: General:  Elderly appearing female, sedated and paralyzed. Neuro:  Sedated, paralyzed, unable to examine. HEENT:  /AT, PERRL, no EOM and MMM Cardiovascular:  RRR, Nl S1/S2, -M/R/G. Lungs:  CTA bilaterally Abdomen:  Soft, NT, ND and +BS Musculoskeletal:  -edema and -tenderness Skin:  Wounds noted.  LABS:  BMET  Recent Labs Lab 04/20/17 0343 04/20/17 1556 04/20/17 1604 05-May-2017 0304  NA 140 140 142 140  K 3.2* 4.2 4.1 3.4*  CL 110 110 107 109  CO2 19* 20*  --  19*  BUN 22* 24* 23* 27*  CREATININE 1.18* 1.19* 1.10* 1.33*  GLUCOSE 104* 118* 121* 104*    Electrolytes  Recent Labs Lab 05/04/2017 1909 04/20/17 0343 04/20/17 1556 May 05, 2017 0304  CALCIUM  --  8.5* 8.6* 8.4*  MG 3.3* 2.4 2.2  --     CBC  Recent Labs Lab 04/20/17 0343 04/20/17 1556 04/20/17 1604 2017-05-05 0304  WBC 17.3* 22.5*  --  11.5*  HGB 8.3* 7.5* 7.1* 8.9*  HCT 24.6* 22.2* 21.0* 25.9*  PLT 114* 124*  --  98*    Coag's  Recent Labs Lab 04/16/17 1101 04/22/2017 0548 04/29/2017 1451  APTT 37*  --  34  INR 2.08 1.18 1.85    Sepsis Markers No results for  input(s): LATICACIDVEN, PROCALCITON, O2SATVEN in the last 168 hours.  ABG  Recent Labs Lab 05-May-2017 0542 May 05, 2017 0904 05-May-2017 0940  PHART 7.496* 7.272* 7.145*  PCO2ART 24.6* 31.3* 51.0*  PO2ART 52.0* 53.0* 53.0*    Liver Enzymes  Recent Labs Lab 04/16/17 1101  AST 28  ALT 28  ALKPHOS 129*  BILITOT 1.4*  ALBUMIN 3.7    Cardiac Enzymes No results for input(s): TROPONINI, PROBNP in the last 168 hours.  Glucose  Recent Labs Lab 05/05/2017 0157 May 05, 2017 0258 05/05/17 0503 05-05-2017 0606 05-May-2017 0644 2017-05-05 0757  GLUCAP 117* 105* 96 97 118* 96    Imaging Ct Head Wo Contrast  Result Date: 04/20/2017 CLINICAL DATA:  Altered mental status EXAM: CT HEAD WITHOUT CONTRAST TECHNIQUE: Contiguous axial images were obtained from the base of the skull through the vertex without intravenous contrast. COMPARISON:  None. FINDINGS: Brain: There are bilateral areas of hypoattenuation in the occipital lobes, which are age-indeterminate. 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 No hemorrhage. No midline shift or mass effect. There is periventricular hypoattenuation compatible with chronic microvascular disease. Vascular: No hyperdense vessel or unexpected calcification.  Skull: Normal visualized skull base, calvarium and extracranial soft tissues. Sinuses/Orbits: No sinus fluid levels or advanced mucosal thickening. No mastoid effusion. Normal orbits. IMPRESSION: 1. Bilateral areas of hypoattenuation in the occipital lobes are age indeterminate but may indicate late acute to early subacute infarcts. MRI is recommended. 2. No hemorrhage or mass effect. These results will be called to the ordering clinician or representative by the Radiologist Assistant, and communication documented in the PACS or zVision Dashboard. The Electronically Signed   By: Deatra Robinson M.D.   On: 04/20/2017 19:21   Dg Chest Port 1 View  Result Date: 05/14/2017 CLINICAL DATA:  CS EXAM: PORTABLE CHEST 1 VIEW COMPARISON:   04/20/2017 FINDINGS: Endotracheal tube 4.2 cm above the carina. Nasogastric tube enters the stomach. Mediastinal drain and left-sided chest tube remain in place. Right Swan-Ganz catheter is in the right pulmonary artery. Prior CABG.  Prosthetic mitral valve.  Epicardial pacer leads noted. Mildly worsened right and stable left opacity at the lung bases with obscuration of the left hemidiaphragm as before, probably from pleural effusion and/ or atelectasis. Rounded density in the left mid lung probably reflecting loculated fluid in the major fissure. No visible pneumothorax. IMPRESSION: 1. Mildly worsened aeration in the right mid lung and right lung base, although some of this could be from differing distribution of pleural effusion. There is likely a component of atelectasis in the lung bases, pneumonia not completely excluded. 2. Loculated fluid in the left major fissure. 3. Support apparatus satisfactorily position.  No pneumothorax. Electronically Signed   By: Gaylyn Rong M.D.   On: 05/16/2017 09:46   Dg Chest Port 1 View  Result Date: 05/12/2017 CLINICAL DATA:  Low saturations this morning. Endotracheal and chest tubes. EXAM: PORTABLE CHEST 1 VIEW COMPARISON:  04/20/2017 FINDINGS: Postoperative changes in the mediastinum. Endotracheal tube with tip measuring 2.9 cm above the carina. Enteric tube tip is off the field of view but below the left hemidiaphragm. Left chest tube and mediastinal drains remain in place. Right Swan-Ganz catheter with tip in the right pulmonary artery. Mild cardiac enlargement. Mild perihilar infiltration may represent edema or pneumonia. Small bilateral pleural effusions greater on the left. No pneumothorax. IMPRESSION: Appliances appear in satisfactory position. Cardiac enlargement with mild perihilar edema. Can't exclude basilar pneumonia. Small bilateral pleural effusions. No significant changes since previous study. Electronically Signed   By: Burman Nieves M.D.   On:  05/12/2017 05:44     STUDIES:  CXR 10/3 with RLL and LUL infiltrate noted  CULTURES: Blood 10/3>>> Urine 10/3>>> Sputum 10/3>>>  ANTIBIOTICS: Ceftaz 10/3>>> Vanc 10/3>>>  SIGNIFICANT EVENTS:  10/3>>> ARDS  LINES/TUBES: R IJ PAC 10/2>>> ETT 10/2>>> R radial a-line 10/2>>>  DISCUSSION: 75 year old female with CAD history s/p CABG who was a smoker up until 3 months ago presenting with multi-lobar pneumonia and ARDS and septic shock.  PCCM asked to assist with vent and hemodynamic management.  ASSESSMENT / PLAN:  PULMONARY A: ARDS Multilobar pneumonia P:   - Hold off ARDS protocol for now, will adjust based on ABG - Full vent support - Nimbex - Titrate O2 for sat of 88-92%  CARDIOVASCULAR A:  Septic vs card shock P:  - PAC numbers now - Add vasopressin drip to levo - Treat sepsis - CVP  RENAL A:   Cr increase to 1.33 P:   - Hydrate gently given hypoxemia - Base hydration on PAC numbers - Replace electrolytes as indicated - BMET in AM - Replace K  GASTROINTESTINAL A:   No acute disease P:   - TF per nutrition  HEMATOLOGIC A:   Dropping WBC from 22 to 11 with infection, concerning P:  - CBC in AM - Transfuse per ICU protocol  INFECTIOUS A:   Multiple lobar pneumonia P:   - Vanc/ceftaz - Pan culture - F/U on cultures - PCT  ENDOCRINE A:   No DM history   P:   - Monitor - Cortisol level - Stress dose steroids  NEUROLOGIC A:   Sedated and paralyzed P:   RASS goal: -4 - Nimbex - Fentanyl - PRN versed  FAMILY  - Updates: Husband updated bedside.  - Inter-disciplinary family meet or Palliative Care meeting due by:  day 7  The patient is critically ill with multiple organ systems failure and requires high complexity decision making for assessment and support, frequent evaluation and titration of therapies, application of advanced monitoring technologies and extensive interpretation of multiple databases.   Critical Care Time  devoted to patient care services described in this note is  45  Minutes. This time reflects time of care of this signee Dr Koren Bound. This critical care time does not reflect procedure time, or teaching time or supervisory time of PA/NP/Med student/Med Resident etc but could involve care discussion time.  Alyson Reedy, M.D. [facility] Pulmonary/Critical Care Medicine. Pager: [phone]. After hours pager: [phone].  05/09/2017, 9:52 AM

## 2017-05-20 NOTE — Procedures (Signed)
hemodialysis Catheter Insertion Procedure Note FAATIMAH YOSS [id number] [birth year]  Procedure: Insertion of Central Venous Catheter Indications: CVVHD  Procedure Details Consent: Risks of procedure as well as the alternatives and risks of each were explained to the (patient/caregiver).  Consent for procedure obtained. Time Out: Verified patient identification, verified procedure, site/side was marked, verified correct patient position, special equipment/implants available, medications/allergies/relevent history reviewed, required imaging and test results available.  Performed  Maximum sterile technique was used including antiseptics, cap, gloves, gown, hand hygiene, mask and sheet. Skin prep: Chlorhexidine; local anesthetic administered A antimicrobial bonded/coated triple lumen trialysis catheter was placed in the left internal jugular vein using the Seldinger technique.  Evaluation Blood flow good Complications: No apparent complications Patient did tolerate procedure well. Chest X-ray ordered to verify placement.  CXR: pending.  Performed under direct MD supervision.  Performed using ultrasound guidance.  Wire visualized in vessel under ultrasound.   WHITEHEART,KATHRYN 04-28-17, 12:28 PM  I was present and supervised the entire procedure.  Alyson Reedy, M.D. [facility] Pulmonary/Critical Care Medicine. Pager: [phone]. After hours pager: [phone].

## 2017-05-20 DEATH — deceased

## 2018-04-30 IMAGING — DX DG CHEST 1V PORT
1 series · 1 of 1 positions shown · non-contrast
Comparison: 02/10/2017

CLINICAL DATA: Shortness of breath.

EXAM:
PORTABLE CHEST 1 VIEW

[chest ap]
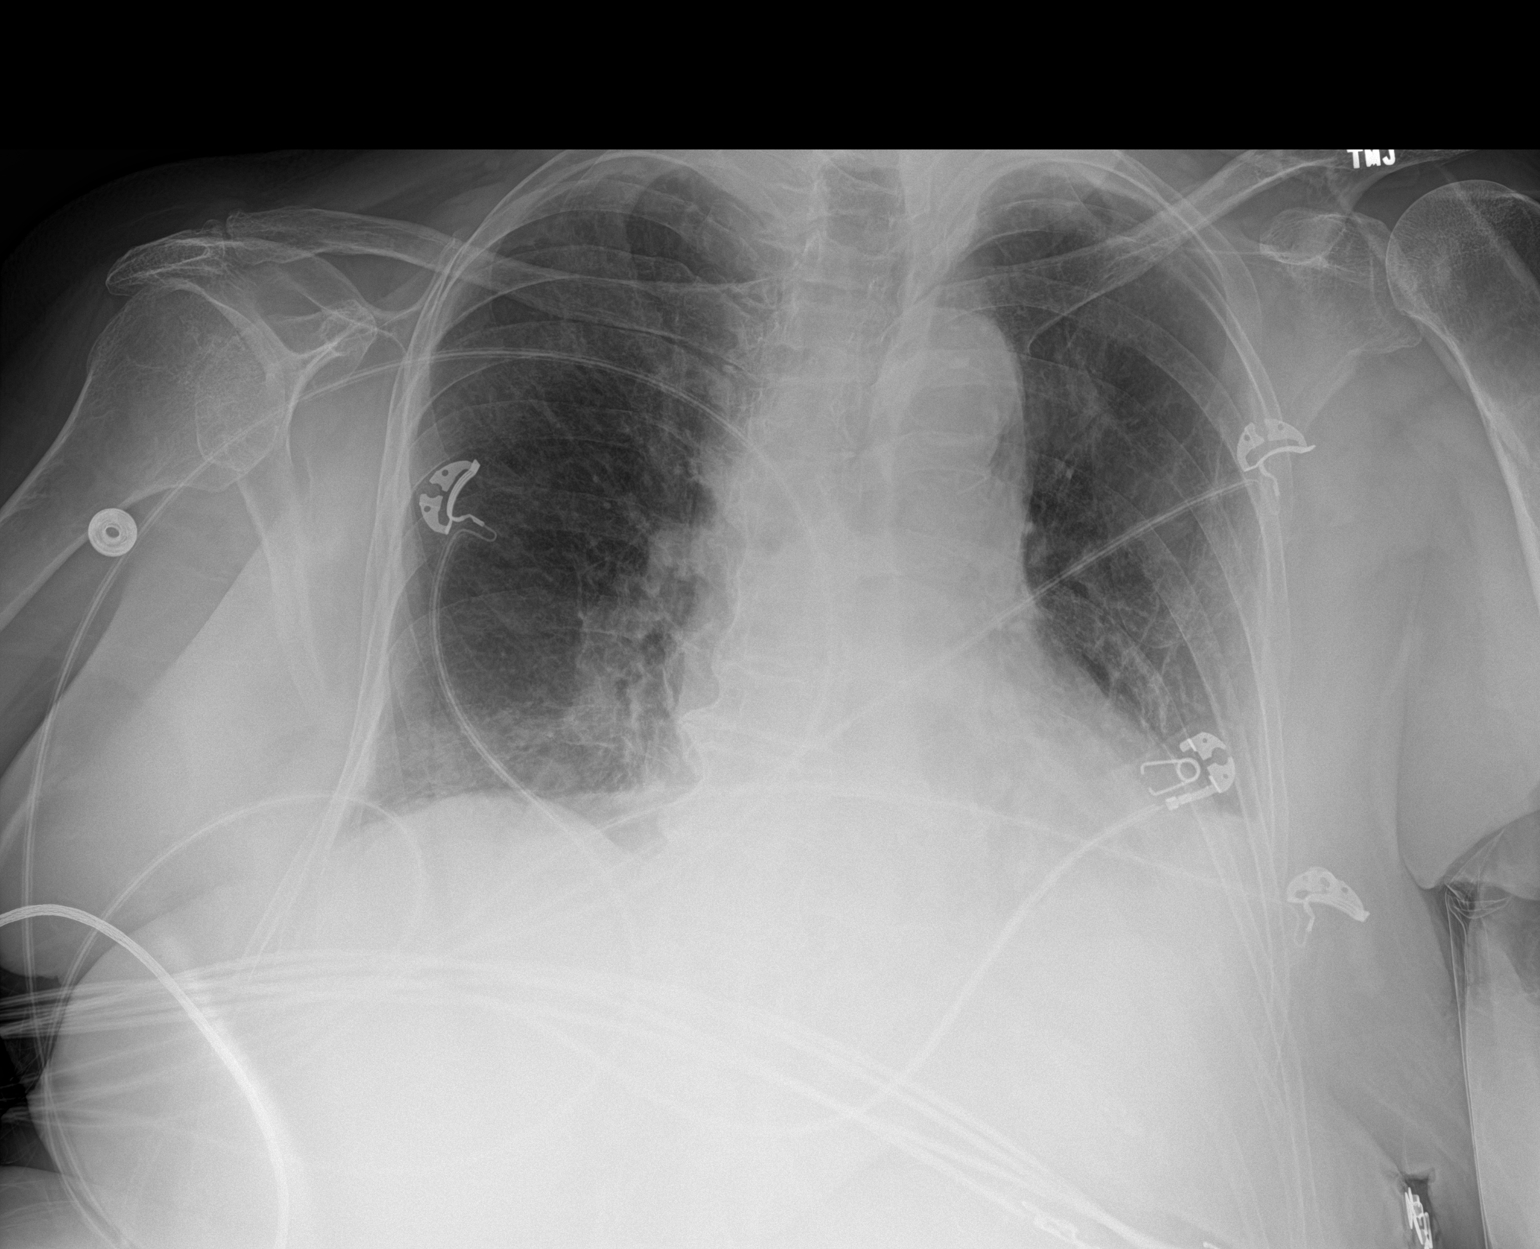

[1 of 1 positions shown; findings below may reference images not displayed]

FINDINGS: The patient is rotated to the left. The cardiomediastinal silhouette
is unchanged. The lungs are mildly hypoinflated. A small left
pleural effusion and patchy left basilar airspace opacity are
unchanged. There is unchanged minimal right basilar atelectasis. No
pleural effusion is seen. Thoracic spondylosis is noted.
IMPRESSION: Unchanged small left pleural effusion and left basilar pneumonia or
atelectasis.

## 2018-04-30 IMAGING — CT CT CHEST W/O CM
2 of 4 series · 13 of 46 positions shown, 15 images · non-contrast
Comparison: Multiple exams, including chest radiograph from
02/11/2017 and CT scan from 07/18/2014.

CLINICAL DATA: Nausea, vomiting, diarrhea, and diffuse abdominal
pain since yesterday. Pt also became hypoxic while in ED. Pt had a
STEMI last week with stent placement. Was d/c home on [REDACTED].

EXAM:
CT CHEST, ABDOMEN AND PELVIS WITHOUT CONTRAST
TECHNIQUE: Multidetector CT imaging of the chest, abdomen and pelvis was
performed following the standard protocol without IV contrast.

[Series 4: cap without · axial · non-contrast · 0.77mm/px · z∈[-22,+518]mm · 10 of 126 slices shown, 12 images]
[im 9/126  soft-tissue]
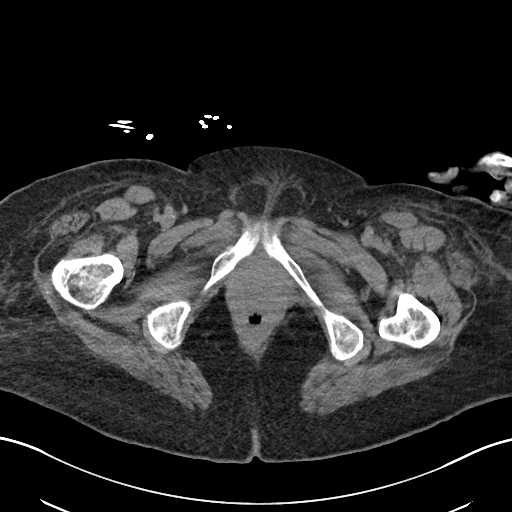
[im 9/126  bone]
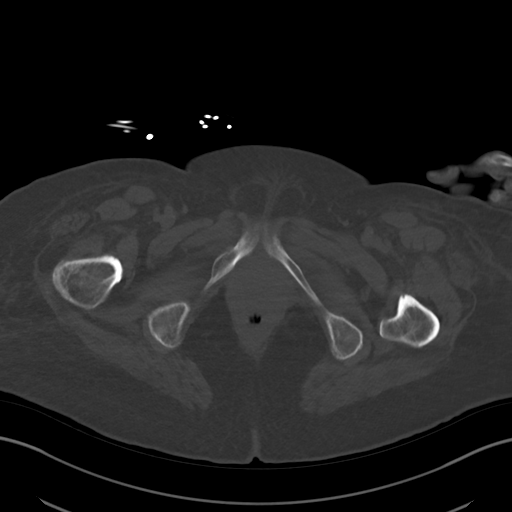
[im 26/126  soft-tissue]
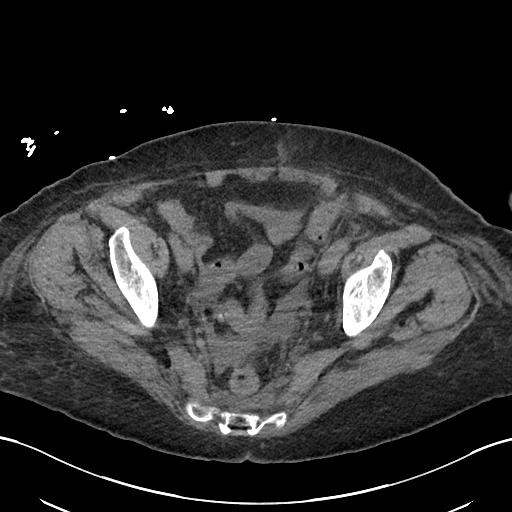
[im 34/126  soft-tissue]
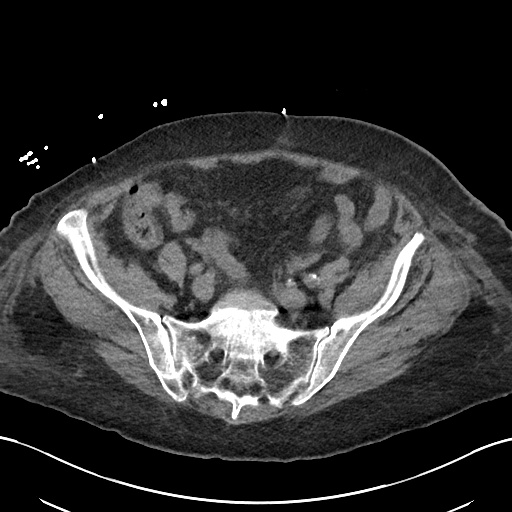
[im 42/126  soft-tissue]
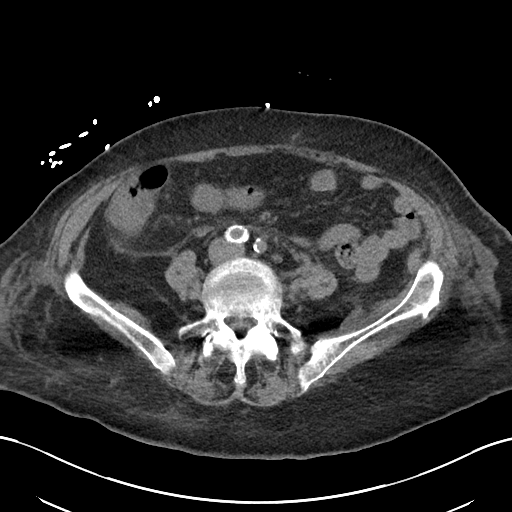
[im 59/126  soft-tissue]
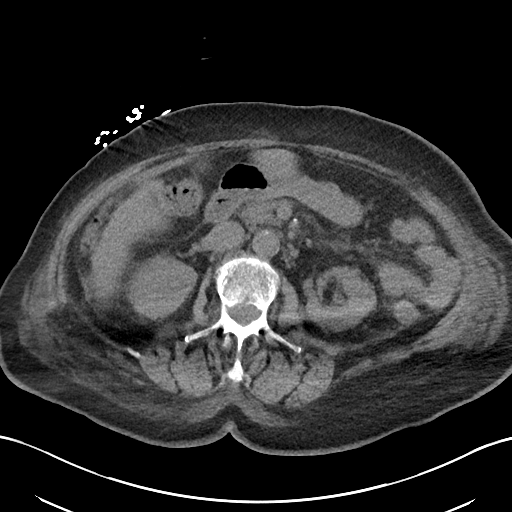
[im 67/126  soft-tissue]
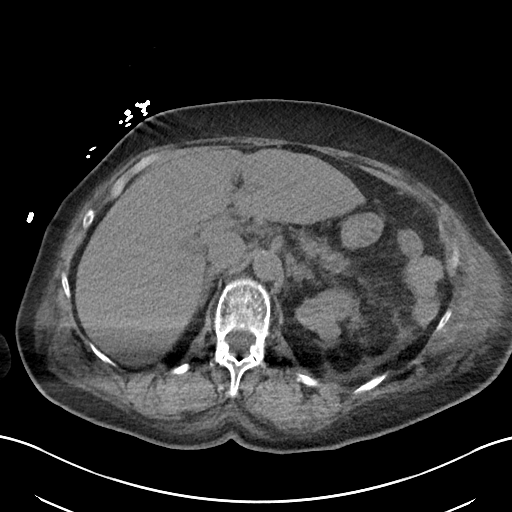
[im 84/126  soft-tissue]
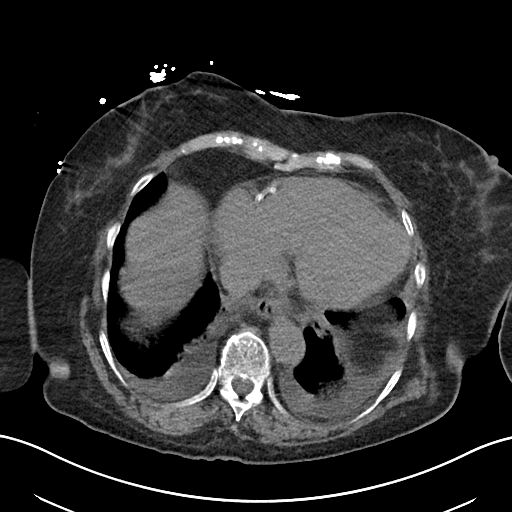
[im 92/126  soft-tissue]
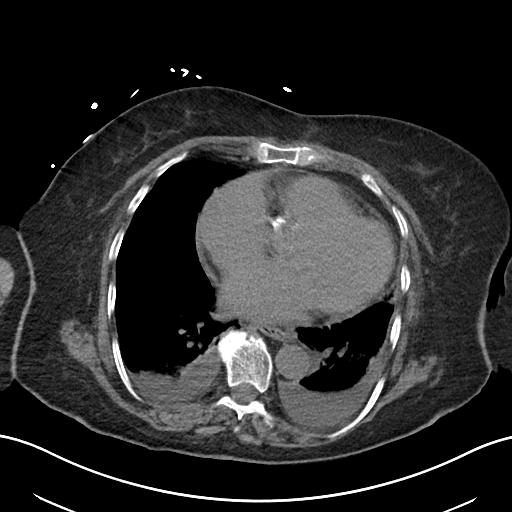
[im 101/126  soft-tissue]
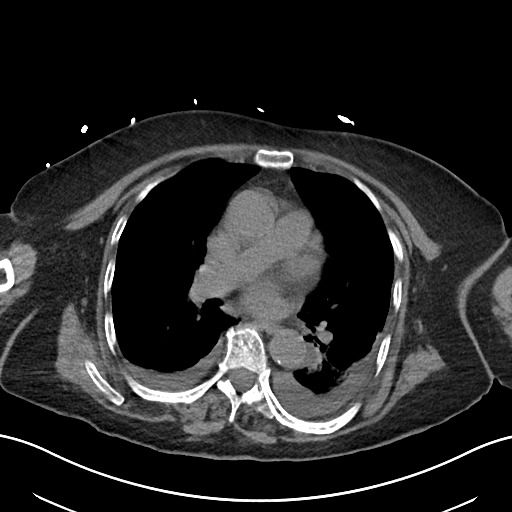
[im 101/126  bone]
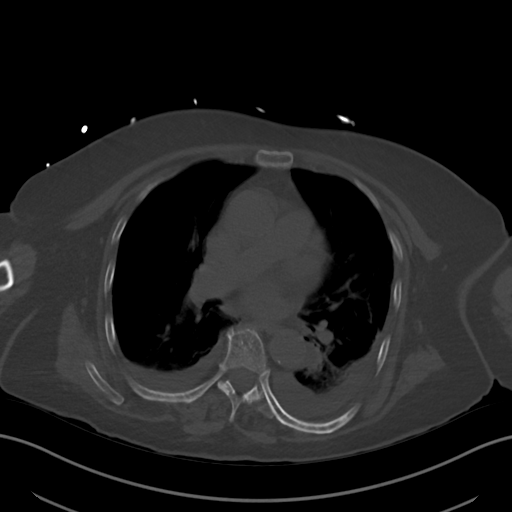
[im 117/126  soft-tissue]
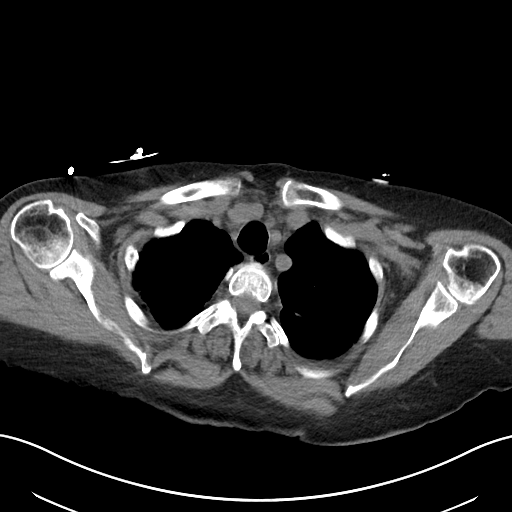

[Series 7: cor · coronal · 0.88mm/px · 3 of 101 slices shown]
[im 34/101  soft-tissue]
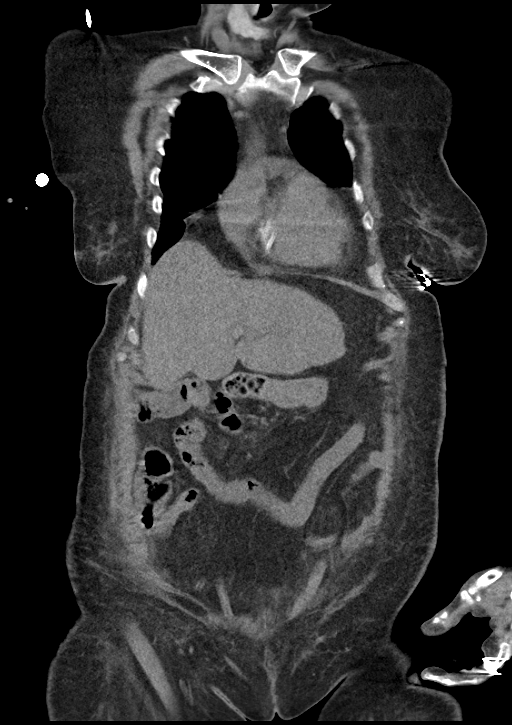
[im 45/101  soft-tissue]
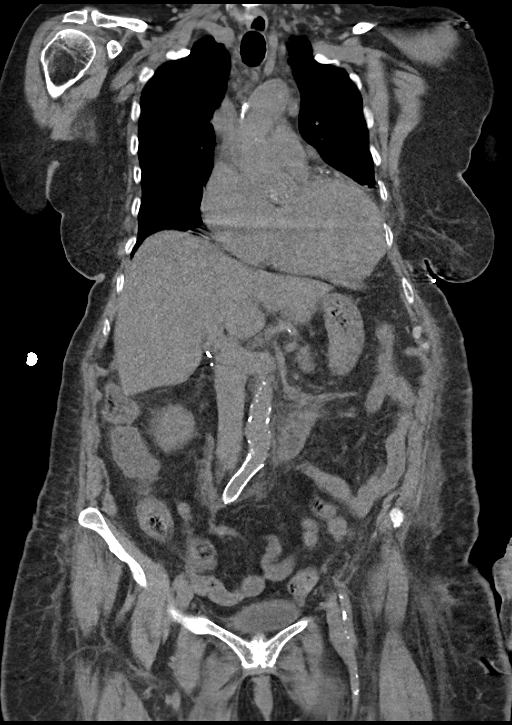
[im 56/101  soft-tissue]
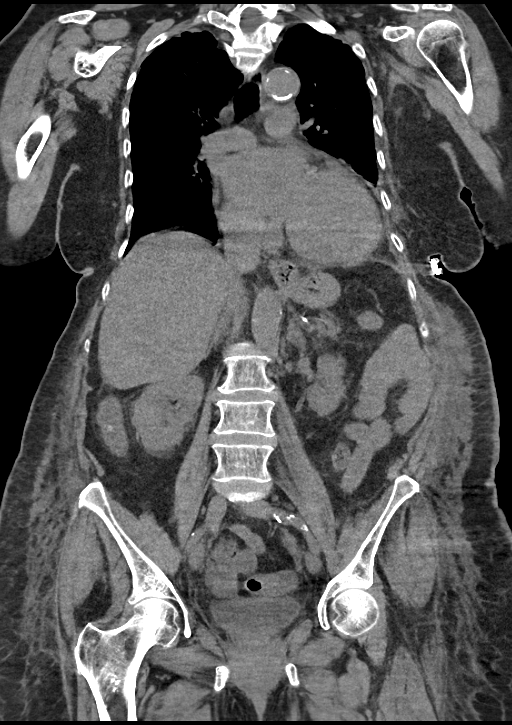

[13 of 46 positions shown; findings below may reference images not displayed]

FINDINGS: CT CHEST FINDINGS

Cardiovascular: Coronary, aortic arch, and branch vessel
atherosclerotic vascular disease. Moderate cardiomegaly.

Mediastinum/Nodes: Right lower paratracheal node at the level the
carina measures 1.7 cm in short axis on image [DATE]. Other smaller
prevascular and paratracheal nodes are observed.

Lungs/Pleura: Mild atelectasis in the right middle lobe. Indistinct
ground-glass opacities in the superior segment right lower lobe and
adjacent portion of the right upper lobe. Subsegmental atelectasis
in the left upper lobe and left lower lobe. Bilateral small pleural
effusions with passive atelectasis. Loculated fluid in the left
major fissure.

Musculoskeletal: The patient was unable to raise her arms for
imaging, resulting in streak artifact along the abdomen and pelvis
which mildly reduces diagnostic sensitivity and specificity.

Thoracic spondylosis. Minimal midthoracic wedging and slight
superior endplate concavity at T12.

CT ABDOMEN PELVIS FINDINGS

Hepatobiliary: Cholecystectomy.  Otherwise unremarkable.

Pancreas: Unremarkable

Spleen: Unremarkable

Adrenals/Urinary Tract: Adrenal glands normal. Suspected scarring in
the left kidney upper pole. Abnormal left perirenal stranding/edema
without hydronephrosis or appreciable nephrolithiasis. Mild likely
chronic right perirenal stranding. Suspected cyst of the right
kidney lower pole anteriorly. Borderline fullness of the right
ureter without stones identified.

Stomach/Bowel: Air fluid level in the rectum suspicious for
diarrheal process. Most of the colon is nondistended.

Vascular/Lymphatic: Aortoiliac atherosclerotic vascular disease.

Reproductive: Uterus not well seen, query hysterectomy.

Other: In addition to the left perirenal/retroperitoneal stranding,
there is presacral edema which is new compared to 07/18/2014. Small
but abnormal amount of free pelvic fluid. Low-level stranding along
the pelvic sidewalls. Subcutaneous edema along the lower anterior
abdominal walls laterally.

Musculoskeletal: Chronic soft tissue defect along the right lateral
abdominal wall musculature posteriorly adjacent to the right kidney
and in the subcostal region, not appreciably changed from [AGE]
healed fractures of the right pubic rami.

Grade 1 anterolisthesis at L4-5 appears degenerative and is not
appreciably changed from 4956. There is lumbar spondylosis and
degenerative disc disease with probable central narrowing of the
thecal sac at the L4-5 level. Degenerative endplate findings at
L5-S1 with vacuum disc phenomenon. I do not see definite cortical
discontinuity in the sacrum or a well-defined sacral fracture.
IMPRESSION: 1. Third spacing of fluid, with small bilateral pleural effusions,
small amount of ascites, flank edema, retroperitoneal edema (left
greater than right), and presacral edema. I do not see
hydronephrosis for a sacral fracture as a specific cause to suggest
an underlying injury or obstruction as a cause for the edema.
2. Moderate cardiomegaly with evidence of coronary artery disease.
Aortic Atherosclerosis ([id number]-XOA.A).
3. In the major fissure, small portion of the left pleural effusion
appears to be loculated.
4. Air-fluid level in the rectum suggestive of diarrheal process.
Most of the colon is nondistended. Neither oral nor IV contrast was
administered.
5. Lower lumbar spondylosis and degenerative disc disease, with
potential impingement at L4-5.
6. There is an enlarged right lower paratracheal lymph node at
cm diameter. No prior cross-sectional imaging through this region.
The appearance is abnormal but nonspecific and could be due to
passive congestion from congestive heart failure, or otherwise
occult malignancy. This may warrant surveillance for follow up.
7. Chronic defect in the right posterolateral abdominal wall
musculature below the right twelfth rib, with absent visualization
of the normal muscular tissues in this vicinity.

## 2018-07-07 IMAGING — CT CT HEAD W/O CM
4 series · 16 of 47 positions shown, 18 images · non-contrast
Comparison: None.

CLINICAL DATA: Altered mental status

EXAM:
CT HEAD WITHOUT CONTRAST
TECHNIQUE: Contiguous axial images were obtained from the base of the skull
through the vertex without intravenous contrast.

[Series 3: head without · axial · non-contrast · 0.42mm/px · z∈[-132,-12]mm · 7 of 34 slices shown, 9 images]
[im 5/34  brain]
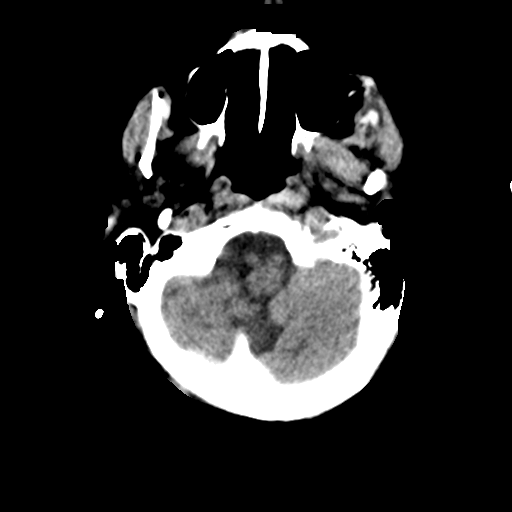
[im 5/34  bone]
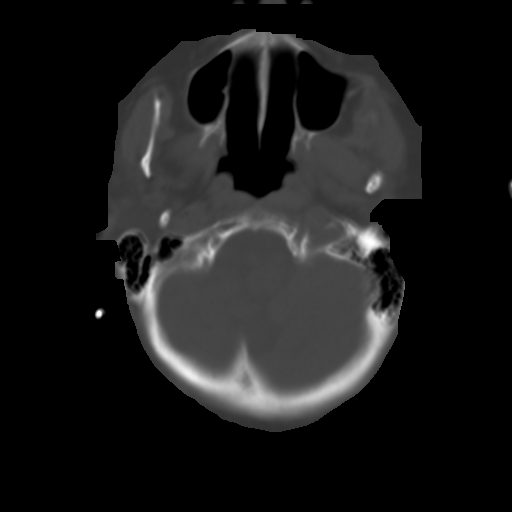
[im 9/34  brain]
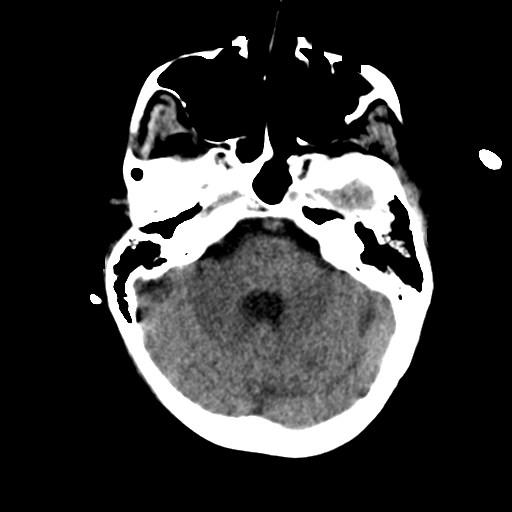
[im 13/34  brain]
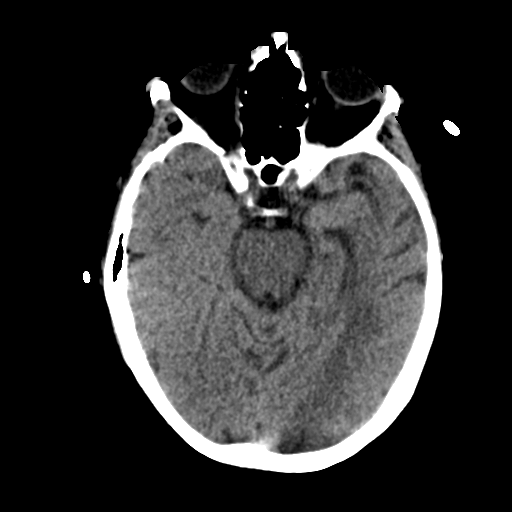
[im 17/34  brain]
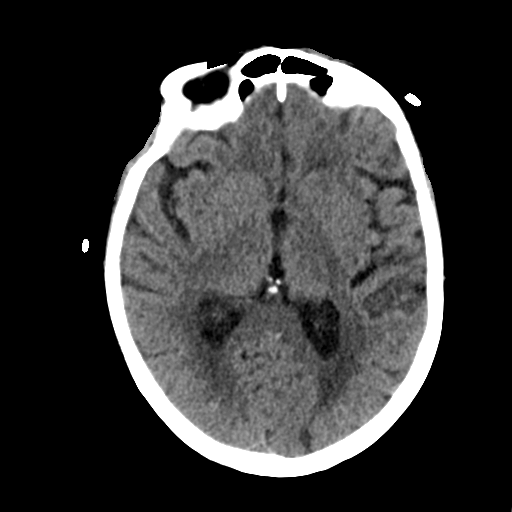
[im 21/34  brain]
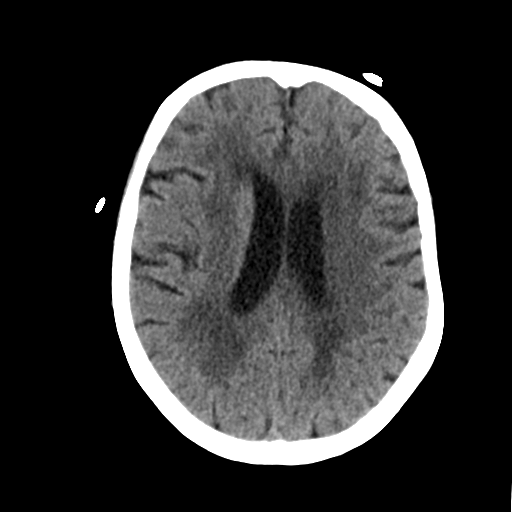
[im 21/34  bone]
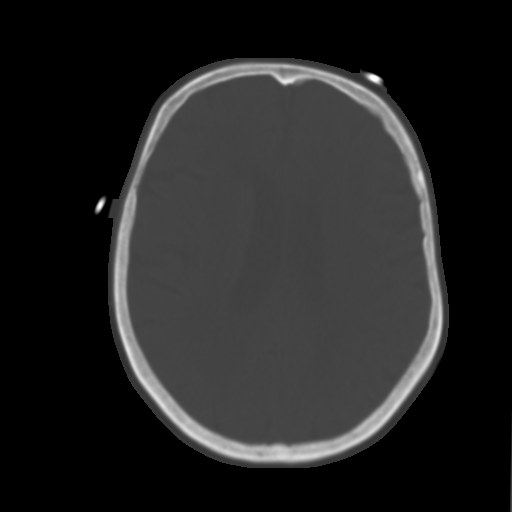
[im 25/34  brain]
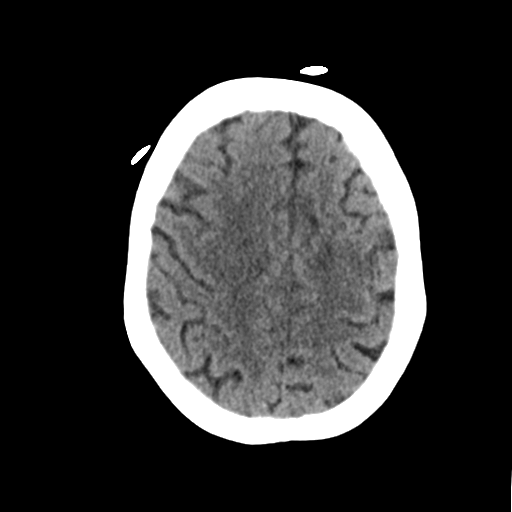
[im 29/34  brain]
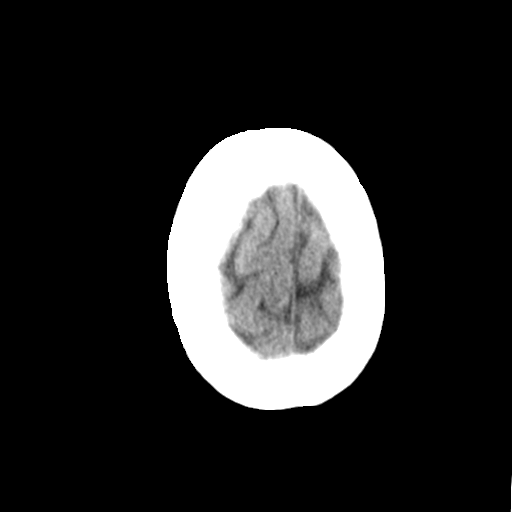

[Series 4: head bone · axial · 0.42mm/px · z∈[-136,-102]mm · 3 of 85 slices shown]
[im 9/85  bone]
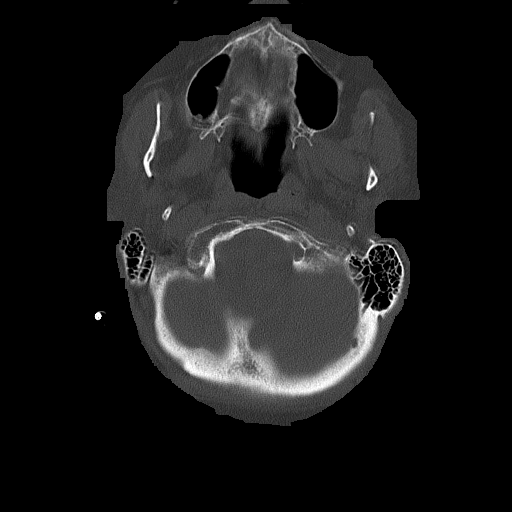
[im 17/85  bone]
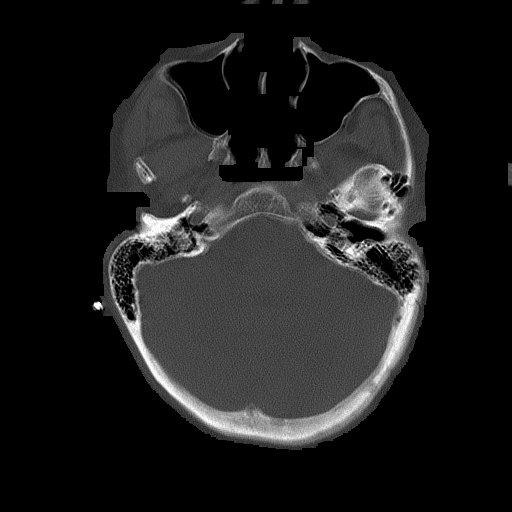
[im 26/85  bone]
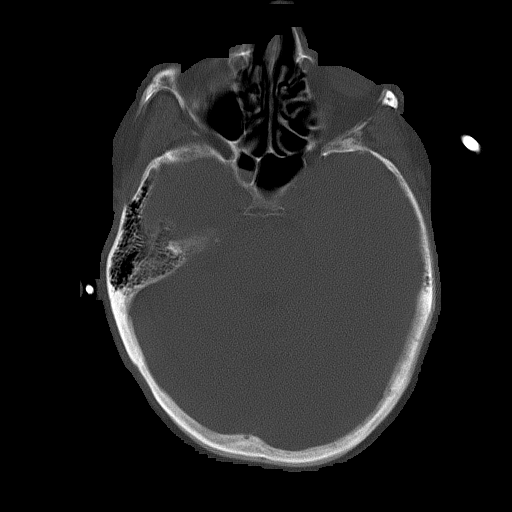

[Series 5: head without cor · coronal · non-contrast · 0.33mm/px · 3 of 69 slices shown]
[im 23/69  brain]
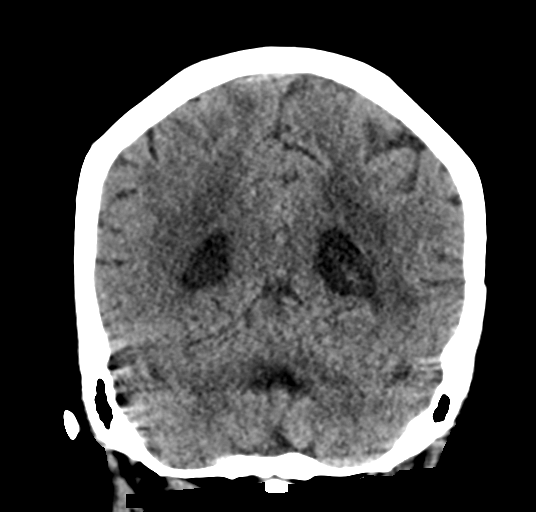
[im 31/69  brain]
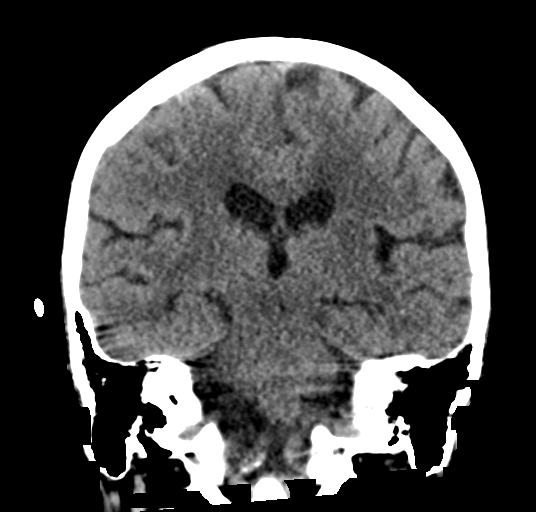
[im 38/69  brain]
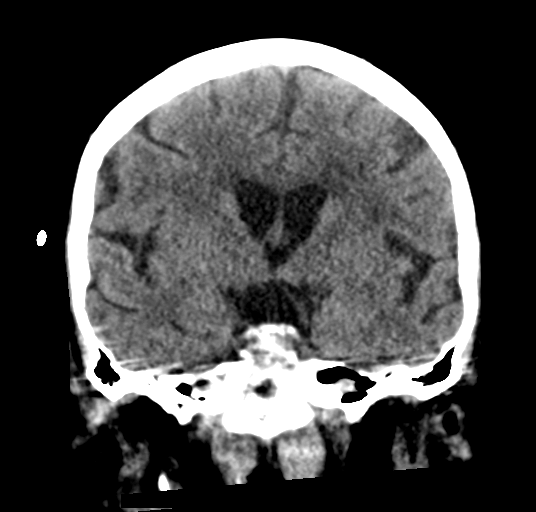

[Series 6: head without sag · sagittal · non-contrast · 0.31mm/px · 3 of 61 slices shown]
[im 21/61  brain]
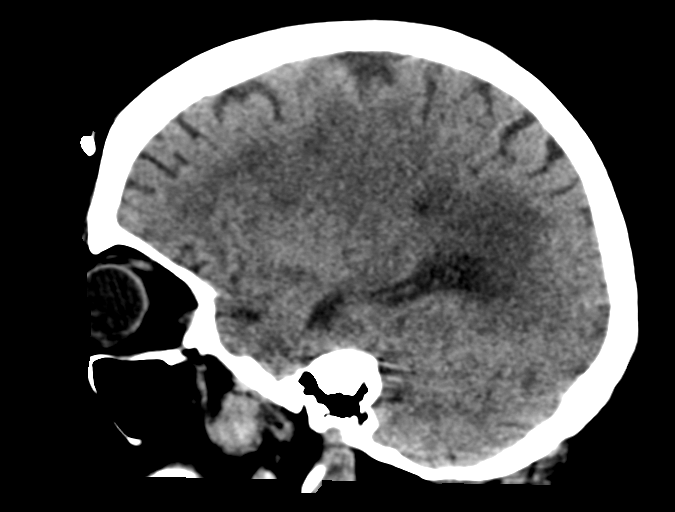
[im 31/61  brain]
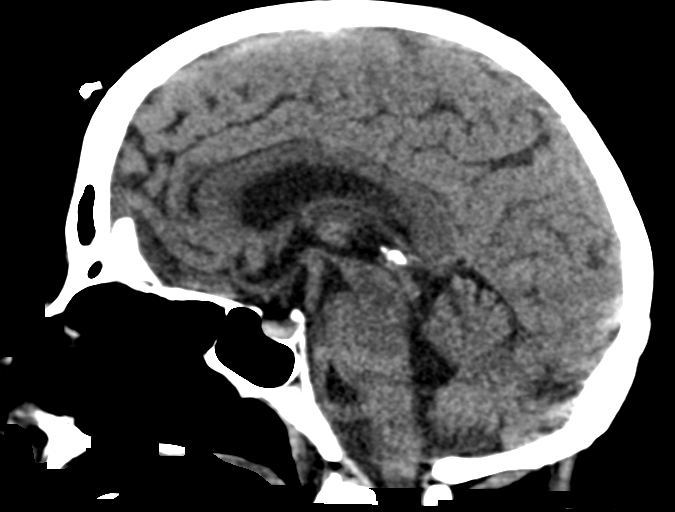
[im 41/61  brain]
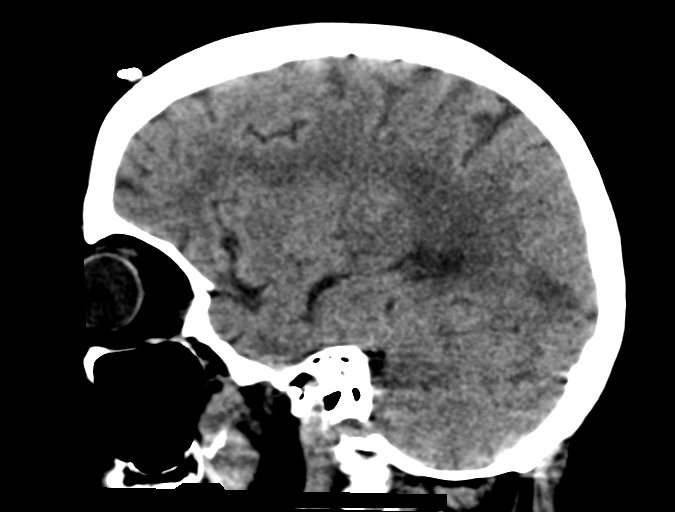

[16 of 47 positions shown; findings below may reference images not displayed]

FINDINGS: Brain: There are bilateral areas of hypoattenuation in the occipital
lobes, which are age-indeterminate. Old left caudate head lacunar
infarct. No hemorrhage. No midline shift or mass effect. There is
periventricular hypoattenuation compatible with chronic
microvascular disease.

Vascular: No hyperdense vessel or unexpected calcification.

Skull: Normal visualized skull base, calvarium and extracranial soft
tissues.

Sinuses/Orbits: No sinus fluid levels or advanced mucosal
thickening. No mastoid effusion. Normal orbits.
IMPRESSION: 1. Bilateral areas of hypoattenuation in the occipital lobes are age
indeterminate but may indicate late acute to early subacute
infarcts. MRI is recommended.
2. No hemorrhage or mass effect.
These results will be called to the ordering clinician or
representative by the Radiologist Assistant, and communication
documented in the PACS or zVision Dashboard. The
